# Patient Record
Sex: Female | Born: 1945
Health system: Southern US, Community
[De-identification: ages and names within clinical notes are randomized; demographics above are authoritative.]

## PROBLEM LIST (undated history)

## (undated) DIAGNOSIS — T7840XA Allergy, unspecified, initial encounter: Secondary | ICD-10-CM

## (undated) DIAGNOSIS — Z789 Other specified health status: Secondary | ICD-10-CM

## (undated) DIAGNOSIS — F419 Anxiety disorder, unspecified: Secondary | ICD-10-CM

## (undated) DIAGNOSIS — F32A Depression, unspecified: Secondary | ICD-10-CM

## (undated) DIAGNOSIS — E785 Hyperlipidemia, unspecified: Secondary | ICD-10-CM

## (undated) DIAGNOSIS — H269 Unspecified cataract: Secondary | ICD-10-CM

## (undated) HISTORY — PX: OTHER SURGICAL HISTORY: SHX169

## (undated) HISTORY — DX: Allergy, unspecified, initial encounter: T78.40XA

## (undated) HISTORY — PX: EYE SURGERY: SHX253

## (undated) HISTORY — DX: Hyperlipidemia, unspecified: E78.5

---

## 1970-04-14 HISTORY — PX: CHOLECYSTECTOMY: SHX55

## 1978-04-14 HISTORY — PX: ABDOMINAL HYSTERECTOMY: SHX81

## 2005-03-05 ENCOUNTER — Ambulatory Visit: Payer: Self-pay | Admitting: Internal Medicine

## 2007-03-25 ENCOUNTER — Ambulatory Visit: Payer: Self-pay | Admitting: Internal Medicine

## 2008-04-05 ENCOUNTER — Ambulatory Visit: Payer: Self-pay | Admitting: Internal Medicine

## 2009-04-10 ENCOUNTER — Ambulatory Visit: Payer: Self-pay | Admitting: Internal Medicine

## 2010-05-06 ENCOUNTER — Ambulatory Visit: Payer: Self-pay | Admitting: Internal Medicine

## 2011-06-03 ENCOUNTER — Ambulatory Visit: Payer: Self-pay | Admitting: Internal Medicine

## 2013-05-16 ENCOUNTER — Ambulatory Visit: Payer: Self-pay | Admitting: Family Medicine

## 2014-05-18 ENCOUNTER — Ambulatory Visit: Payer: Self-pay | Admitting: Family Medicine

## 2014-12-21 ENCOUNTER — Ambulatory Visit (INDEPENDENT_AMBULATORY_CARE_PROVIDER_SITE_OTHER): Payer: PPO | Admitting: Family Medicine

## 2014-12-21 ENCOUNTER — Encounter: Payer: Self-pay | Admitting: Family Medicine

## 2014-12-21 VITALS — BP 128/66 | HR 76 | Temp 97.8°F | Resp 18 | Ht 61.0 in | Wt 157.8 lb

## 2014-12-21 DIAGNOSIS — E785 Hyperlipidemia, unspecified: Secondary | ICD-10-CM

## 2014-12-21 NOTE — Progress Notes (Signed)
Name: Abigail Hatfield   MRN: [id number]    DOB: [birth year]   Date:12/21/2014       Progress Note  Subjective  Chief Complaint  Chief Complaint  Patient presents with  . Follow-up    6 mo / labs for cholesterol - fasting  . Hyperlipidemia    Hyperlipidemia The problem is controlled. Recent lipid tests were reviewed and are normal. Pertinent negatives include no chest pain or shortness of breath. Current antihyperlipidemic treatment includes diet change, exercise and herbal therapy (Red yeast rice). The current treatment provides significant improvement of lipids. There are no compliance problems.     Past Medical History  Diagnosis Date  . Allergy   . Hyperlipidemia     Past Surgical History  Procedure Laterality Date  . Abdominal hysterectomy  1980  . Cholecystectomy  1972  . Cesarean section  1967  . Cesarean section  1971    Family History  Problem Relation Age of Onset  . Diabetes Mother   . Hypertension Mother   . Heart failure Father   . Diabetes Brother   . Hypertension Brother     Social History   Social History  . Marital Status: Married    Spouse Name: N/A  . Number of Children: N/A  . Years of Education: N/A   Occupational History  . Not on file.   Social History Main Topics  . Smoking status: Never Smoker   . Smokeless tobacco: Never Used  . Alcohol Use: No  . Drug Use: No  . Sexual Activity: No   Other Topics Concern  . Not on file   Social History Narrative  . No narrative on file     Current outpatient prescriptions:  .  Black Cohosh 540 MG CAPS, , Disp: , Rfl:  .  Cholecalciferol (VITAMIN D3) 5000 UNITS CAPS, Take by mouth., Disp: , Rfl:  .  Misc Nat HMG CoA Reduct Inhib (CHOLESTIN PO), 1,200 mg., Disp: , Rfl:  .  nitrofurantoin, macrocrystal-monohydrate, (MACROBID) 100 MG capsule, Take by mouth., Disp: , Rfl:   Allergies  Allergen Reactions  . Moxifloxacin Hcl In Nacl Swelling  . Prednisone   . Penicillins Rash   Review of  Systems  Constitutional: Positive for weight loss. Negative for fever and chills.  Respiratory: Negative for shortness of breath.   Cardiovascular: Negative for chest pain.  Gastrointestinal: Negative for abdominal pain.   Objective  Filed Vitals:   12/21/14 0902  BP: 128/66  Pulse: 76  Temp: 97.8 F (36.6 C)  TempSrc: Oral  Resp: 18  Height: 5\' 1"  (1.549 m)  Weight: 157 lb 12.8 oz (71.578 kg)  SpO2: 94%    Physical Exam  Constitutional: She is well-developed, well-nourished, and in no distress.  Cardiovascular: Normal rate and regular rhythm.   Pulmonary/Chest: Effort normal and breath sounds normal.  Abdominal: Soft. Bowel sounds are normal.  Nursing note and vitals reviewed.   Assessment & Plan  1. Dyslipidemia  Recheck FLP and liver enzymes. Continue excellent dietary and lifestyle interventions.  - Lipid Profile - Comprehensive Metabolic Panel (CMET)   Abigail Hatfield Asad A. [facility] 12/21/2014 9:17 AM

## 2014-12-22 ENCOUNTER — Telehealth: Payer: Self-pay | Admitting: Family Medicine

## 2014-12-22 LAB — LIPID PANEL
CHOL/HDL RATIO: 2.7 ratio (ref 0.0–4.4)
CHOLESTEROL TOTAL: 173 mg/dL (ref 100–199)
HDL: 63 mg/dL (ref >39)
LDL Calculated: 90 mg/dL (ref 0–99)
TRIGLYCERIDES: 100 mg/dL (ref 0–149)
VLDL Cholesterol Cal: 20 mg/dL (ref 5–40)

## 2014-12-22 LAB — COMPREHENSIVE METABOLIC PANEL
A/G RATIO: 1.6 (ref 1.1–2.5)
ALBUMIN: 4.3 g/dL (ref 3.5–4.8)
ALT: 13 IU/L (ref 0–32)
AST: 18 IU/L (ref 0–40)
Alkaline Phosphatase: 51 IU/L (ref 39–117)
BILIRUBIN TOTAL: 0.4 mg/dL (ref 0.0–1.2)
BUN / CREAT RATIO: 16 (ref 11–26)
BUN: 14 mg/dL (ref 8–27)
CALCIUM: 9.8 mg/dL (ref 8.7–10.3)
CO2: 25 mmol/L (ref 18–29)
Chloride: 100 mmol/L (ref 97–108)
Creatinine, Ser: 0.88 mg/dL (ref 0.57–1.00)
GFR, EST AFRICAN AMERICAN: 77 mL/min/{1.73_m2} (ref >59)
GFR, EST NON AFRICAN AMERICAN: 67 mL/min/{1.73_m2} (ref >59)
Globulin, Total: 2.7 g/dL (ref 1.5–4.5)
Glucose: 99 mg/dL (ref 65–99)
POTASSIUM: 5 mmol/L (ref 3.5–5.2)
Sodium: 141 mmol/L (ref 134–144)
TOTAL PROTEIN: 7 g/dL (ref 6.0–8.5)

## 2014-12-22 NOTE — Telephone Encounter (Signed)
SHE JUST NEEDS TO KNOW IF YOU FAXED HER A REF TO THE [facility] ESMEIL BUT TO GO TO [place]  IN [place]. THE DOS OF SERVICE WAS 12-14-2013. FAX # IS [phone].

## 2014-12-27 NOTE — Telephone Encounter (Signed)
Notified patient that [employer] do not allow [place] to back-date referrals any longer. Her Eye doctor should be aware of this as most doctor offices are aware of this policy put in place.

## 2015-05-02 DIAGNOSIS — M9905 Segmental and somatic dysfunction of pelvic region: Secondary | ICD-10-CM | POA: Diagnosis not present

## 2015-05-02 DIAGNOSIS — M5416 Radiculopathy, lumbar region: Secondary | ICD-10-CM | POA: Diagnosis not present

## 2015-05-02 DIAGNOSIS — M955 Acquired deformity of pelvis: Secondary | ICD-10-CM | POA: Diagnosis not present

## 2015-05-02 DIAGNOSIS — M9903 Segmental and somatic dysfunction of lumbar region: Secondary | ICD-10-CM | POA: Diagnosis not present

## 2015-05-29 DIAGNOSIS — M9905 Segmental and somatic dysfunction of pelvic region: Secondary | ICD-10-CM | POA: Diagnosis not present

## 2015-05-29 DIAGNOSIS — M955 Acquired deformity of pelvis: Secondary | ICD-10-CM | POA: Diagnosis not present

## 2015-05-29 DIAGNOSIS — M5416 Radiculopathy, lumbar region: Secondary | ICD-10-CM | POA: Diagnosis not present

## 2015-05-29 DIAGNOSIS — M9903 Segmental and somatic dysfunction of lumbar region: Secondary | ICD-10-CM | POA: Diagnosis not present

## 2015-06-27 DIAGNOSIS — M955 Acquired deformity of pelvis: Secondary | ICD-10-CM | POA: Diagnosis not present

## 2015-06-27 DIAGNOSIS — M5416 Radiculopathy, lumbar region: Secondary | ICD-10-CM | POA: Diagnosis not present

## 2015-06-27 DIAGNOSIS — M9903 Segmental and somatic dysfunction of lumbar region: Secondary | ICD-10-CM | POA: Diagnosis not present

## 2015-06-27 DIAGNOSIS — M9905 Segmental and somatic dysfunction of pelvic region: Secondary | ICD-10-CM | POA: Diagnosis not present

## 2015-07-25 DIAGNOSIS — M9903 Segmental and somatic dysfunction of lumbar region: Secondary | ICD-10-CM | POA: Diagnosis not present

## 2015-07-25 DIAGNOSIS — M5416 Radiculopathy, lumbar region: Secondary | ICD-10-CM | POA: Diagnosis not present

## 2015-07-25 DIAGNOSIS — M955 Acquired deformity of pelvis: Secondary | ICD-10-CM | POA: Diagnosis not present

## 2015-07-25 DIAGNOSIS — M9905 Segmental and somatic dysfunction of pelvic region: Secondary | ICD-10-CM | POA: Diagnosis not present

## 2015-08-22 DIAGNOSIS — M9903 Segmental and somatic dysfunction of lumbar region: Secondary | ICD-10-CM | POA: Diagnosis not present

## 2015-08-22 DIAGNOSIS — M9905 Segmental and somatic dysfunction of pelvic region: Secondary | ICD-10-CM | POA: Diagnosis not present

## 2015-08-22 DIAGNOSIS — M5416 Radiculopathy, lumbar region: Secondary | ICD-10-CM | POA: Diagnosis not present

## 2015-08-22 DIAGNOSIS — M955 Acquired deformity of pelvis: Secondary | ICD-10-CM | POA: Diagnosis not present

## 2015-09-19 DIAGNOSIS — M5416 Radiculopathy, lumbar region: Secondary | ICD-10-CM | POA: Diagnosis not present

## 2015-09-19 DIAGNOSIS — M9905 Segmental and somatic dysfunction of pelvic region: Secondary | ICD-10-CM | POA: Diagnosis not present

## 2015-09-19 DIAGNOSIS — M955 Acquired deformity of pelvis: Secondary | ICD-10-CM | POA: Diagnosis not present

## 2015-09-19 DIAGNOSIS — M9903 Segmental and somatic dysfunction of lumbar region: Secondary | ICD-10-CM | POA: Diagnosis not present

## 2015-10-17 DIAGNOSIS — M9903 Segmental and somatic dysfunction of lumbar region: Secondary | ICD-10-CM | POA: Diagnosis not present

## 2015-10-17 DIAGNOSIS — M9905 Segmental and somatic dysfunction of pelvic region: Secondary | ICD-10-CM | POA: Diagnosis not present

## 2015-10-17 DIAGNOSIS — M955 Acquired deformity of pelvis: Secondary | ICD-10-CM | POA: Diagnosis not present

## 2015-10-17 DIAGNOSIS — M5416 Radiculopathy, lumbar region: Secondary | ICD-10-CM | POA: Diagnosis not present

## 2015-11-14 DIAGNOSIS — M5416 Radiculopathy, lumbar region: Secondary | ICD-10-CM | POA: Diagnosis not present

## 2015-11-14 DIAGNOSIS — M955 Acquired deformity of pelvis: Secondary | ICD-10-CM | POA: Diagnosis not present

## 2015-11-14 DIAGNOSIS — M9903 Segmental and somatic dysfunction of lumbar region: Secondary | ICD-10-CM | POA: Diagnosis not present

## 2015-11-14 DIAGNOSIS — M9905 Segmental and somatic dysfunction of pelvic region: Secondary | ICD-10-CM | POA: Diagnosis not present

## 2015-12-12 ENCOUNTER — Telehealth: Payer: Self-pay | Admitting: Family Medicine

## 2015-12-12 DIAGNOSIS — M9905 Segmental and somatic dysfunction of pelvic region: Secondary | ICD-10-CM | POA: Diagnosis not present

## 2015-12-12 DIAGNOSIS — M9903 Segmental and somatic dysfunction of lumbar region: Secondary | ICD-10-CM | POA: Diagnosis not present

## 2015-12-12 DIAGNOSIS — M955 Acquired deformity of pelvis: Secondary | ICD-10-CM | POA: Diagnosis not present

## 2015-12-12 DIAGNOSIS — M5416 Radiculopathy, lumbar region: Secondary | ICD-10-CM | POA: Diagnosis not present

## 2015-12-12 NOTE — Telephone Encounter (Signed)
We will order lab work after her office visit. Please inform patient to come in fasting and drink a lot of water.

## 2015-12-13 ENCOUNTER — Encounter: Payer: Self-pay | Admitting: Family Medicine

## 2015-12-13 ENCOUNTER — Telehealth: Payer: Self-pay | Admitting: Family Medicine

## 2015-12-13 ENCOUNTER — Ambulatory Visit (INDEPENDENT_AMBULATORY_CARE_PROVIDER_SITE_OTHER): Payer: PPO | Admitting: Family Medicine

## 2015-12-13 VITALS — BP 130/67 | HR 79 | Temp 98.6°F | Resp 15 | Ht 61.0 in | Wt 151.5 lb

## 2015-12-13 DIAGNOSIS — E559 Vitamin D deficiency, unspecified: Secondary | ICD-10-CM | POA: Diagnosis not present

## 2015-12-13 DIAGNOSIS — Z01419 Encounter for gynecological examination (general) (routine) without abnormal findings: Secondary | ICD-10-CM

## 2015-12-13 DIAGNOSIS — Z1211 Encounter for screening for malignant neoplasm of colon: Secondary | ICD-10-CM

## 2015-12-13 DIAGNOSIS — E162 Hypoglycemia, unspecified: Secondary | ICD-10-CM | POA: Diagnosis not present

## 2015-12-13 DIAGNOSIS — R5383 Other fatigue: Secondary | ICD-10-CM | POA: Diagnosis not present

## 2015-12-13 DIAGNOSIS — Z1239 Encounter for other screening for malignant neoplasm of breast: Secondary | ICD-10-CM

## 2015-12-13 DIAGNOSIS — E785 Hyperlipidemia, unspecified: Secondary | ICD-10-CM | POA: Diagnosis not present

## 2015-12-13 LAB — COMPLETE METABOLIC PANEL WITH GFR
ALT: 12 U/L (ref 6–29)
AST: 17 U/L (ref 10–35)
Albumin: 4.1 g/dL (ref 3.6–5.1)
Alkaline Phosphatase: 41 U/L (ref 33–130)
BUN: 14 mg/dL (ref 7–25)
CHLORIDE: 106 mmol/L (ref 98–110)
CO2: 28 mmol/L (ref 20–31)
Calcium: 9.7 mg/dL (ref 8.6–10.4)
Creat: 0.83 mg/dL (ref 0.60–0.93)
GFR, EST NON AFRICAN AMERICAN: 71 mL/min (ref >=60)
GFR, Est African American: 82 mL/min (ref >=60)
GLUCOSE: 91 mg/dL (ref 65–99)
POTASSIUM: 5 mmol/L (ref 3.5–5.3)
SODIUM: 144 mmol/L (ref 135–146)
Total Bilirubin: 0.5 mg/dL (ref 0.2–1.2)
Total Protein: 6.9 g/dL (ref 6.1–8.1)

## 2015-12-13 LAB — LIPID PANEL
CHOL/HDL RATIO: 2.2 ratio (ref <=5.0)
Cholesterol: 176 mg/dL (ref 125–200)
HDL: 80 mg/dL (ref >=46)
LDL CALC: 79 mg/dL (ref <130)
Triglycerides: 84 mg/dL (ref <150)
VLDL: 17 mg/dL (ref <30)

## 2015-12-13 LAB — TSH: TSH: 2.02 mIU/L

## 2015-12-13 NOTE — Telephone Encounter (Signed)
Medication list has been updated.

## 2015-12-13 NOTE — Progress Notes (Signed)
Name: Abigail Hatfield   MRN: [id number]    DOB: Apr 20, 1945   Date:12/13/2015       Progress Note  Subjective  Chief Complaint  Chief Complaint  Patient presents with  . Annual Exam    CPE    HPI  Pt. Presents for a Complete Physical Exam Mammogram is done yearly, last mammogram February 2016. Never had a Colonoscopy. Never had a DEXA Scan.  Past Medical History:  Diagnosis Date  . Allergy   . Hyperlipidemia     Past Surgical History:  Procedure Laterality Date  . ABDOMINAL HYSTERECTOMY  1980  . [place]  . CESAREAN SECTION  1971  . CHOLECYSTECTOMY  1972    Family History  Problem Relation Age of Onset  . Diabetes Mother   . Hypertension Mother   . Heart failure Father   . Diabetes Brother   . Hypertension Brother     Social History   Social History  . Marital status: Married    Spouse name: N/A  . Number of children: N/A  . Years of education: N/A   Occupational History  . Not on file.   Social History Main Topics  . Smoking status: Never Smoker  . Smokeless tobacco: Never Used  . Alcohol use No  . Drug use: No  . Sexual activity: No   Other Topics Concern  . Not on file   Social History Narrative  . No narrative on file     Current Outpatient Prescriptions:  .  Black Cohosh 540 MG CAPS, , Disp: , Rfl:  .  Cholecalciferol (VITAMIN D3) 5000 UNITS CAPS, Take by mouth., Disp: , Rfl:  .  Misc Nat HMG CoA Reduct Inhib (CHOLESTIN PO), 1,200 mg., Disp: , Rfl:  .  nitrofurantoin, macrocrystal-monohydrate, (MACROBID) 100 MG capsule, Take by mouth., Disp: , Rfl:   Allergies  Allergen Reactions  . Moxifloxacin Hcl In Nacl Swelling  . Prednisone   . Penicillins Rash     Review of Systems  Constitutional: Negative for chills, fever and malaise/fatigue.  HENT: Negative for congestion and sore throat.   Eyes: Negative for blurred vision.  Respiratory: Negative for cough, sputum production and shortness of breath.   Cardiovascular:  Negative for chest pain, palpitations and leg swelling.  Gastrointestinal: Negative for blood in stool, constipation, heartburn, nausea and vomiting.  Genitourinary: Negative for dysuria and frequency.  Musculoskeletal: Positive for back pain. Negative for myalgias.  Skin: Negative for rash.  Neurological: Negative for dizziness and headaches.  Psychiatric/Behavioral: Negative for depression. The patient is not nervous/anxious.      Objective  Vitals:   12/13/15 0857  BP: 130/67  Pulse: 79  Resp: 15  Temp: 98.6 F (37 C)  TempSrc: Oral  SpO2: 96%  Weight: 151 lb 8 oz (68.7 kg)  Height: 5\' 1"  (1.549 m)    Physical Exam  Constitutional: She is oriented to person, place, and time and well-developed, well-nourished, and in no distress.  HENT:  Head: Normocephalic and atraumatic.  Right Ear: Tympanic membrane and ear canal normal. No drainage or swelling.  Left Ear: Tympanic membrane and ear canal normal. No drainage or swelling.  Mouth/Throat: No posterior oropharyngeal erythema.  Cardiovascular: Normal rate, regular rhythm, S1 normal, S2 normal and normal heart sounds.   No murmur heard. Pulmonary/Chest: Breath sounds normal. She has no wheezes. Right breast exhibits no mass and no nipple discharge. Left breast exhibits no mass and no nipple discharge.  Abdominal: Soft. Bowel sounds  are normal. There is no tenderness.  Genitourinary:  Genitourinary Comments: Deferred.  Musculoskeletal:       Right ankle: She exhibits no swelling.       Left ankle: She exhibits no swelling.  Neurological: She is alert and oriented to person, place, and time.  Skin: Skin is warm and dry.  Psychiatric: Mood, memory, affect and judgment normal.  Nursing note and vitals reviewed.    Assessment & Plan  1. Well woman exam with routine gynecological exam Age-appropriate lab screening obtained.  - Lipid Profile - COMPLETE METABOLIC PANEL WITH GFR - Vitamin D (25 hydroxy) - TSH - HgB  A1c  2. Screening for breast cancer  - MM Digital Screening; Future  3. Screening for colon cancer Never had a colonoscopy, order Cologuard.  - Cologuard   Jesilyn Easom Asad A. [facility] 12/13/2015 9:03 AM

## 2015-12-14 LAB — HEMOGLOBIN A1C
HEMOGLOBIN A1C: 5.3 % (ref <5.7)
MEAN PLASMA GLUCOSE: 105 mg/dL

## 2015-12-14 LAB — VITAMIN D 25 HYDROXY (VIT D DEFICIENCY, FRACTURES): Vit D, 25-Hydroxy: 72 ng/mL (ref 30–100)

## 2015-12-18 DIAGNOSIS — M9905 Segmental and somatic dysfunction of pelvic region: Secondary | ICD-10-CM | POA: Diagnosis not present

## 2015-12-18 DIAGNOSIS — M5416 Radiculopathy, lumbar region: Secondary | ICD-10-CM | POA: Diagnosis not present

## 2015-12-18 DIAGNOSIS — M955 Acquired deformity of pelvis: Secondary | ICD-10-CM | POA: Diagnosis not present

## 2015-12-18 DIAGNOSIS — M9903 Segmental and somatic dysfunction of lumbar region: Secondary | ICD-10-CM | POA: Diagnosis not present

## 2015-12-20 ENCOUNTER — Encounter: Payer: PPO | Admitting: Family Medicine

## 2015-12-25 ENCOUNTER — Telehealth: Payer: Self-pay | Admitting: Family Medicine

## 2015-12-27 NOTE — Telephone Encounter (Signed)
Patient has been notified of lab results  

## 2016-01-01 ENCOUNTER — Ambulatory Visit: Payer: Self-pay

## 2016-01-07 ENCOUNTER — Ambulatory Visit
Admission: RE | Admit: 2016-01-07 | Discharge: 2016-01-07 | Disposition: A | Discharge status: Home / Self Care | Payer: PPO | Source: Ambulatory Visit | Attending: Family Medicine | Admitting: Family Medicine

## 2016-01-07 ENCOUNTER — Other Ambulatory Visit: Payer: Self-pay | Admitting: Family Medicine

## 2016-01-07 DIAGNOSIS — Z1239 Encounter for other screening for malignant neoplasm of breast: Secondary | ICD-10-CM

## 2016-01-07 DIAGNOSIS — Z1231 Encounter for screening mammogram for malignant neoplasm of breast: Secondary | ICD-10-CM | POA: Diagnosis not present

## 2016-01-09 DIAGNOSIS — M9905 Segmental and somatic dysfunction of pelvic region: Secondary | ICD-10-CM | POA: Diagnosis not present

## 2016-01-09 DIAGNOSIS — M9903 Segmental and somatic dysfunction of lumbar region: Secondary | ICD-10-CM | POA: Diagnosis not present

## 2016-01-09 DIAGNOSIS — M5416 Radiculopathy, lumbar region: Secondary | ICD-10-CM | POA: Diagnosis not present

## 2016-01-09 DIAGNOSIS — M955 Acquired deformity of pelvis: Secondary | ICD-10-CM | POA: Diagnosis not present

## 2016-01-14 DIAGNOSIS — M9903 Segmental and somatic dysfunction of lumbar region: Secondary | ICD-10-CM | POA: Diagnosis not present

## 2016-01-14 DIAGNOSIS — M5416 Radiculopathy, lumbar region: Secondary | ICD-10-CM | POA: Diagnosis not present

## 2016-01-14 DIAGNOSIS — M955 Acquired deformity of pelvis: Secondary | ICD-10-CM | POA: Diagnosis not present

## 2016-01-14 DIAGNOSIS — M9905 Segmental and somatic dysfunction of pelvic region: Secondary | ICD-10-CM | POA: Diagnosis not present

## 2016-02-06 DIAGNOSIS — M9905 Segmental and somatic dysfunction of pelvic region: Secondary | ICD-10-CM | POA: Diagnosis not present

## 2016-02-06 DIAGNOSIS — M5416 Radiculopathy, lumbar region: Secondary | ICD-10-CM | POA: Diagnosis not present

## 2016-02-06 DIAGNOSIS — M9903 Segmental and somatic dysfunction of lumbar region: Secondary | ICD-10-CM | POA: Diagnosis not present

## 2016-02-06 DIAGNOSIS — M955 Acquired deformity of pelvis: Secondary | ICD-10-CM | POA: Diagnosis not present

## 2016-03-12 DIAGNOSIS — M9903 Segmental and somatic dysfunction of lumbar region: Secondary | ICD-10-CM | POA: Diagnosis not present

## 2016-03-12 DIAGNOSIS — M9905 Segmental and somatic dysfunction of pelvic region: Secondary | ICD-10-CM | POA: Diagnosis not present

## 2016-03-12 DIAGNOSIS — M955 Acquired deformity of pelvis: Secondary | ICD-10-CM | POA: Diagnosis not present

## 2016-03-12 DIAGNOSIS — M5416 Radiculopathy, lumbar region: Secondary | ICD-10-CM | POA: Diagnosis not present

## 2016-03-14 DIAGNOSIS — H2511 Age-related nuclear cataract, right eye: Secondary | ICD-10-CM | POA: Diagnosis not present

## 2016-04-09 DIAGNOSIS — M5416 Radiculopathy, lumbar region: Secondary | ICD-10-CM | POA: Diagnosis not present

## 2016-04-09 DIAGNOSIS — M9903 Segmental and somatic dysfunction of lumbar region: Secondary | ICD-10-CM | POA: Diagnosis not present

## 2016-04-09 DIAGNOSIS — M955 Acquired deformity of pelvis: Secondary | ICD-10-CM | POA: Diagnosis not present

## 2016-04-09 DIAGNOSIS — M9905 Segmental and somatic dysfunction of pelvic region: Secondary | ICD-10-CM | POA: Diagnosis not present

## 2016-05-07 DIAGNOSIS — M955 Acquired deformity of pelvis: Secondary | ICD-10-CM | POA: Diagnosis not present

## 2016-05-07 DIAGNOSIS — M5416 Radiculopathy, lumbar region: Secondary | ICD-10-CM | POA: Diagnosis not present

## 2016-05-07 DIAGNOSIS — M9903 Segmental and somatic dysfunction of lumbar region: Secondary | ICD-10-CM | POA: Diagnosis not present

## 2016-05-07 DIAGNOSIS — M9905 Segmental and somatic dysfunction of pelvic region: Secondary | ICD-10-CM | POA: Diagnosis not present

## 2016-06-04 DIAGNOSIS — M5416 Radiculopathy, lumbar region: Secondary | ICD-10-CM | POA: Diagnosis not present

## 2016-06-04 DIAGNOSIS — M9905 Segmental and somatic dysfunction of pelvic region: Secondary | ICD-10-CM | POA: Diagnosis not present

## 2016-06-04 DIAGNOSIS — M9903 Segmental and somatic dysfunction of lumbar region: Secondary | ICD-10-CM | POA: Diagnosis not present

## 2016-06-04 DIAGNOSIS — M955 Acquired deformity of pelvis: Secondary | ICD-10-CM | POA: Diagnosis not present

## 2016-07-02 DIAGNOSIS — M955 Acquired deformity of pelvis: Secondary | ICD-10-CM | POA: Diagnosis not present

## 2016-07-02 DIAGNOSIS — M9903 Segmental and somatic dysfunction of lumbar region: Secondary | ICD-10-CM | POA: Diagnosis not present

## 2016-07-02 DIAGNOSIS — M9905 Segmental and somatic dysfunction of pelvic region: Secondary | ICD-10-CM | POA: Diagnosis not present

## 2016-07-02 DIAGNOSIS — M5416 Radiculopathy, lumbar region: Secondary | ICD-10-CM | POA: Diagnosis not present

## 2016-07-30 DIAGNOSIS — M9905 Segmental and somatic dysfunction of pelvic region: Secondary | ICD-10-CM | POA: Diagnosis not present

## 2016-07-30 DIAGNOSIS — M955 Acquired deformity of pelvis: Secondary | ICD-10-CM | POA: Diagnosis not present

## 2016-07-30 DIAGNOSIS — M9903 Segmental and somatic dysfunction of lumbar region: Secondary | ICD-10-CM | POA: Diagnosis not present

## 2016-07-30 DIAGNOSIS — M5416 Radiculopathy, lumbar region: Secondary | ICD-10-CM | POA: Diagnosis not present

## 2016-08-27 DIAGNOSIS — M9905 Segmental and somatic dysfunction of pelvic region: Secondary | ICD-10-CM | POA: Diagnosis not present

## 2016-08-27 DIAGNOSIS — M955 Acquired deformity of pelvis: Secondary | ICD-10-CM | POA: Diagnosis not present

## 2016-08-27 DIAGNOSIS — M9903 Segmental and somatic dysfunction of lumbar region: Secondary | ICD-10-CM | POA: Diagnosis not present

## 2016-08-27 DIAGNOSIS — M5416 Radiculopathy, lumbar region: Secondary | ICD-10-CM | POA: Diagnosis not present

## 2016-10-22 DIAGNOSIS — M9905 Segmental and somatic dysfunction of pelvic region: Secondary | ICD-10-CM | POA: Diagnosis not present

## 2016-10-22 DIAGNOSIS — M9903 Segmental and somatic dysfunction of lumbar region: Secondary | ICD-10-CM | POA: Diagnosis not present

## 2016-10-22 DIAGNOSIS — M5416 Radiculopathy, lumbar region: Secondary | ICD-10-CM | POA: Diagnosis not present

## 2016-10-22 DIAGNOSIS — M955 Acquired deformity of pelvis: Secondary | ICD-10-CM | POA: Diagnosis not present

## 2016-11-25 ENCOUNTER — Telehealth: Payer: Self-pay | Admitting: Family Medicine

## 2016-11-25 ENCOUNTER — Other Ambulatory Visit: Payer: Self-pay | Admitting: Family Medicine

## 2016-11-25 DIAGNOSIS — Z01419 Encounter for gynecological examination (general) (routine) without abnormal findings: Secondary | ICD-10-CM

## 2016-11-25 DIAGNOSIS — Z1159 Encounter for screening for other viral diseases: Secondary | ICD-10-CM

## 2016-11-25 NOTE — Telephone Encounter (Signed)
Pt is stressing over having to get blood work and she has scheduled her CPE for Aug 22nd andshe is wanting to get her labs done in the next few days. Could you please get her lab ordered so that she can come in either Wed or Thur of this week please for her labs.

## 2016-11-25 NOTE — Telephone Encounter (Signed)
Pt said that she is needing a mammogram and [place] told her that the Dr will need to send an order to get this. Pt ask if the Dr will send the order so that she can get this.

## 2016-11-26 DIAGNOSIS — Z01419 Encounter for gynecological examination (general) (routine) without abnormal findings: Secondary | ICD-10-CM | POA: Diagnosis not present

## 2016-11-26 DIAGNOSIS — Z1159 Encounter for screening for other viral diseases: Secondary | ICD-10-CM | POA: Diagnosis not present

## 2016-11-26 DIAGNOSIS — E559 Vitamin D deficiency, unspecified: Secondary | ICD-10-CM | POA: Diagnosis not present

## 2016-11-26 DIAGNOSIS — E785 Hyperlipidemia, unspecified: Secondary | ICD-10-CM | POA: Diagnosis not present

## 2016-11-26 NOTE — Telephone Encounter (Signed)
Pertinent age appropriate lab work has been ordered for the patient

## 2016-11-27 LAB — CBC WITH DIFFERENTIAL/PLATELET
BASOS PCT: 1 %
Basophils Absolute: 55 cells/uL (ref 0–200)
Eosinophils Absolute: 165 cells/uL (ref 15–500)
Eosinophils Relative: 3 %
HCT: 40.7 % (ref 35.0–45.0)
Hemoglobin: 13.3 g/dL (ref 11.7–15.5)
Lymphocytes Relative: 33 %
Lymphs Abs: 1815 cells/uL (ref 850–3900)
MCH: 31.9 pg (ref 27.0–33.0)
MCHC: 32.7 g/dL (ref 32.0–36.0)
MCV: 97.6 fL (ref 80.0–100.0)
MONOS PCT: 5 %
MPV: 9.7 fL (ref 7.5–12.5)
Monocytes Absolute: 275 cells/uL (ref 200–950)
NEUTROS ABS: 3190 {cells}/uL (ref 1500–7800)
Neutrophils Relative %: 58 %
PLATELETS: 320 10*3/uL (ref 140–400)
RBC: 4.17 MIL/uL (ref 3.80–5.10)
RDW: 14 % (ref 11.0–15.0)
WBC: 5.5 10*3/uL (ref 3.8–10.8)

## 2016-11-27 LAB — TSH: TSH: 2.35 mIU/L

## 2016-11-27 NOTE — Telephone Encounter (Signed)
Screening imaging studies will be discussed at the time of her office visit

## 2016-11-28 LAB — LIPID PANEL
Cholesterol: 205 mg/dL — ABNORMAL HIGH (ref <200)
HDL: 76 mg/dL (ref >50)
LDL Cholesterol: 109 mg/dL — ABNORMAL HIGH (ref <100)
Total CHOL/HDL Ratio: 2.7 ratio (ref <5.0)
Triglycerides: 101 mg/dL (ref <150)
VLDL: 20 mg/dL (ref <30)

## 2016-11-28 LAB — COMPLETE METABOLIC PANEL WITHOUT GFR
ALT: 10 U/L (ref 6–29)
AST: 15 U/L (ref 10–35)
Albumin: 4.2 g/dL (ref 3.6–5.1)
Alkaline Phosphatase: 45 U/L (ref 33–130)
BUN: 14 mg/dL (ref 7–25)
CO2: 24 mmol/L (ref 20–32)
Calcium: 9.5 mg/dL (ref 8.6–10.4)
Chloride: 105 mmol/L (ref 98–110)
Creat: 0.81 mg/dL (ref 0.60–0.93)
GFR, Est African American: 85 mL/min (ref >=60)
GFR, Est Non African American: 73 mL/min (ref >=60)
Glucose, Bld: 81 mg/dL (ref 65–99)
Potassium: 5 mmol/L (ref 3.5–5.3)
Sodium: 144 mmol/L (ref 135–146)
Total Bilirubin: 0.5 mg/dL (ref 0.2–1.2)
Total Protein: 6.5 g/dL (ref 6.1–8.1)

## 2016-11-28 LAB — HEPATITIS C ANTIBODY: HCV Ab: NONREACTIVE

## 2016-11-28 LAB — VITAMIN D 25 HYDROXY (VIT D DEFICIENCY, FRACTURES): Vit D, 25-Hydroxy: 59 ng/mL (ref 30–100)

## 2016-12-03 ENCOUNTER — Ambulatory Visit (INDEPENDENT_AMBULATORY_CARE_PROVIDER_SITE_OTHER): Payer: PPO | Admitting: Family Medicine

## 2016-12-03 ENCOUNTER — Encounter: Payer: Self-pay | Admitting: Family Medicine

## 2016-12-03 VITALS — BP 128/80 | HR 83 | Temp 98.1°F | Resp 14 | Ht 61.0 in | Wt 159.9 lb

## 2016-12-03 DIAGNOSIS — Z1231 Encounter for screening mammogram for malignant neoplasm of breast: Secondary | ICD-10-CM

## 2016-12-03 DIAGNOSIS — Z01419 Encounter for gynecological examination (general) (routine) without abnormal findings: Secondary | ICD-10-CM

## 2016-12-03 DIAGNOSIS — Z1239 Encounter for other screening for malignant neoplasm of breast: Secondary | ICD-10-CM

## 2016-12-03 NOTE — Progress Notes (Signed)
Name: Abigail Hatfield   MRN: [id number]    DOB: [birth year]   Date:12/03/2016       Progress Note  Subjective  Chief Complaint  Chief Complaint  Patient presents with  . Annual Exam    HPI  Pt. Presents for Complete Physical Exam.  She did not complete cologuard screening last year. She had a normal mammogram last year, is due for repeat this year.  She has completed lab work pertaining to physical exam visit.    Past Medical History:  Diagnosis Date  . Allergy   . Hyperlipidemia     Past Surgical History:  Procedure Laterality Date  . ABDOMINAL HYSTERECTOMY  1980  . [place]  . CESAREAN SECTION  1971  . CHOLECYSTECTOMY  1972    Family History  Problem Relation Age of Onset  . Diabetes Mother   . Hypertension Mother   . Heart failure Father   . Diabetes Brother   . Hypertension Brother     Social History   Social History  . Marital status: Married    Spouse name: N/A  . Number of children: N/A  . Years of education: N/A   Occupational History  . Not on file.   Social History Main Topics  . Smoking status: Never Smoker  . Smokeless tobacco: Never Used  . Alcohol use No  . Drug use: No  . Sexual activity: No   Other Topics Concern  . Not on file   Social History Narrative  . No narrative on file     Current Outpatient Prescriptions:  .  Black Cohosh 540 MG CAPS, , Disp: , Rfl:  .  Cholecalciferol (VITAMIN D3) 5000 UNITS CAPS, Take by mouth., Disp: , Rfl:  .  Misc Nat HMG CoA Reduct Inhib (CHOLESTIN PO), 1,200 mg., Disp: , Rfl:   Allergies  Allergen Reactions  . Moxifloxacin Hcl In Nacl Swelling  . Prednisone   . Penicillins Rash     Review of Systems  Constitutional: Negative for chills, fever and malaise/fatigue.  HENT: Negative for congestion, ear pain and sore throat.   Eyes: Negative for blurred vision and double vision.  Respiratory: Negative for cough and shortness of breath.   Cardiovascular: Negative for chest  pain, palpitations and leg swelling.  Gastrointestinal: Negative for blood in stool, constipation, diarrhea, nausea and vomiting.  Genitourinary: Negative for dysuria and hematuria.  Musculoskeletal: Negative for back pain and neck pain.  Neurological: Negative for dizziness and headaches.  Psychiatric/Behavioral: Negative for depression. The patient is not nervous/anxious and does not have insomnia.     Objective  Vitals:   12/03/16 1456  BP: 128/80  Pulse: 83  Resp: 14  Temp: 98.1 F (36.7 C)  TempSrc: Oral  SpO2: 96%  Weight: 159 lb 14.4 oz (72.5 kg)  Height: 5\' 1"  (1.549 m)    Physical Exam  Constitutional: She is oriented to person, place, and time and well-developed, well-nourished, and in no distress.  HENT:  Head: Normocephalic and atraumatic.  Right Ear: External ear normal.  Left Ear: External ear normal.  Mouth/Throat: Oropharynx is clear and moist.  Eyes: Pupils are equal, round, and reactive to light.  Neck: Neck supple. No thyromegaly present.  Cardiovascular: Normal rate, regular rhythm and normal heart sounds.   No murmur heard. Pulmonary/Chest: Effort normal and breath sounds normal. She has no wheezes.  Abdominal: Soft. Bowel sounds are normal. There is no tenderness.  Musculoskeletal: She exhibits no edema.  Neurological: She  is alert and oriented to person, place, and time.  Psychiatric: Mood, memory, affect and judgment normal.  Nursing note and vitals reviewed.     Assessment & Plan  1. Well woman exam Obtained age-appropriate laboratory screenings  2. Screening for breast cancer  - MM Digital Screening; Future   Marijean Montanye Asad A. [facility] 12/03/2016 3:00 PM

## 2016-12-08 ENCOUNTER — Encounter: Payer: PPO | Admitting: Family Medicine

## 2016-12-17 DIAGNOSIS — M5033 Other cervical disc degeneration, cervicothoracic region: Secondary | ICD-10-CM | POA: Diagnosis not present

## 2016-12-17 DIAGNOSIS — M9901 Segmental and somatic dysfunction of cervical region: Secondary | ICD-10-CM | POA: Diagnosis not present

## 2016-12-17 DIAGNOSIS — M62830 Muscle spasm of back: Secondary | ICD-10-CM | POA: Diagnosis not present

## 2016-12-17 DIAGNOSIS — M9903 Segmental and somatic dysfunction of lumbar region: Secondary | ICD-10-CM | POA: Diagnosis not present

## 2017-01-07 ENCOUNTER — Ambulatory Visit
Admission: RE | Admit: 2017-01-07 | Discharge: 2017-01-07 | Disposition: A | Discharge status: Home / Self Care | Payer: PPO | Source: Ambulatory Visit | Attending: Family Medicine | Admitting: Family Medicine

## 2017-01-07 DIAGNOSIS — Z1231 Encounter for screening mammogram for malignant neoplasm of breast: Secondary | ICD-10-CM | POA: Insufficient documentation

## 2017-01-07 DIAGNOSIS — Z1239 Encounter for other screening for malignant neoplasm of breast: Secondary | ICD-10-CM

## 2017-02-17 DIAGNOSIS — M9903 Segmental and somatic dysfunction of lumbar region: Secondary | ICD-10-CM | POA: Diagnosis not present

## 2017-02-17 DIAGNOSIS — M5033 Other cervical disc degeneration, cervicothoracic region: Secondary | ICD-10-CM | POA: Diagnosis not present

## 2017-02-17 DIAGNOSIS — M9901 Segmental and somatic dysfunction of cervical region: Secondary | ICD-10-CM | POA: Diagnosis not present

## 2017-02-17 DIAGNOSIS — M62830 Muscle spasm of back: Secondary | ICD-10-CM | POA: Diagnosis not present

## 2017-04-13 DIAGNOSIS — M9903 Segmental and somatic dysfunction of lumbar region: Secondary | ICD-10-CM | POA: Diagnosis not present

## 2017-04-13 DIAGNOSIS — M5033 Other cervical disc degeneration, cervicothoracic region: Secondary | ICD-10-CM | POA: Diagnosis not present

## 2017-04-13 DIAGNOSIS — M62830 Muscle spasm of back: Secondary | ICD-10-CM | POA: Diagnosis not present

## 2017-04-13 DIAGNOSIS — M9901 Segmental and somatic dysfunction of cervical region: Secondary | ICD-10-CM | POA: Diagnosis not present

## 2017-05-08 DIAGNOSIS — H2513 Age-related nuclear cataract, bilateral: Secondary | ICD-10-CM | POA: Diagnosis not present

## 2017-05-11 DIAGNOSIS — M62830 Muscle spasm of back: Secondary | ICD-10-CM | POA: Diagnosis not present

## 2017-05-11 DIAGNOSIS — M5033 Other cervical disc degeneration, cervicothoracic region: Secondary | ICD-10-CM | POA: Diagnosis not present

## 2017-05-11 DIAGNOSIS — M9901 Segmental and somatic dysfunction of cervical region: Secondary | ICD-10-CM | POA: Diagnosis not present

## 2017-05-11 DIAGNOSIS — M9903 Segmental and somatic dysfunction of lumbar region: Secondary | ICD-10-CM | POA: Diagnosis not present

## 2017-05-29 DIAGNOSIS — N390 Urinary tract infection, site not specified: Secondary | ICD-10-CM | POA: Diagnosis not present

## 2017-05-29 DIAGNOSIS — B373 Candidiasis of vulva and vagina: Secondary | ICD-10-CM | POA: Diagnosis not present

## 2017-05-29 DIAGNOSIS — R300 Dysuria: Secondary | ICD-10-CM | POA: Diagnosis not present

## 2017-05-29 DIAGNOSIS — A499 Bacterial infection, unspecified: Secondary | ICD-10-CM | POA: Diagnosis not present

## 2017-06-08 DIAGNOSIS — M9901 Segmental and somatic dysfunction of cervical region: Secondary | ICD-10-CM | POA: Diagnosis not present

## 2017-06-08 DIAGNOSIS — M9903 Segmental and somatic dysfunction of lumbar region: Secondary | ICD-10-CM | POA: Diagnosis not present

## 2017-06-08 DIAGNOSIS — M5033 Other cervical disc degeneration, cervicothoracic region: Secondary | ICD-10-CM | POA: Diagnosis not present

## 2017-06-08 DIAGNOSIS — M62830 Muscle spasm of back: Secondary | ICD-10-CM | POA: Diagnosis not present

## 2017-06-12 DIAGNOSIS — H2511 Age-related nuclear cataract, right eye: Secondary | ICD-10-CM | POA: Diagnosis not present

## 2017-06-16 ENCOUNTER — Encounter: Payer: Self-pay | Admitting: *Deleted

## 2017-06-24 ENCOUNTER — Ambulatory Visit: Payer: PPO | Admitting: Anesthesiology

## 2017-06-24 ENCOUNTER — Ambulatory Visit
Admission: RE | Admit: 2017-06-24 | Discharge: 2017-06-24 | Disposition: A | Discharge status: Home / Self Care | Payer: PPO | Source: Ambulatory Visit | Attending: Ophthalmology | Admitting: Ophthalmology

## 2017-06-24 ENCOUNTER — Encounter: Payer: Self-pay | Admitting: Emergency Medicine

## 2017-06-24 ENCOUNTER — Encounter
Admission: RE | Disposition: A | Discharge status: Home / Self Care | Payer: Self-pay | Source: Ambulatory Visit | Attending: Ophthalmology

## 2017-06-24 DIAGNOSIS — Z87891 Personal history of nicotine dependence: Secondary | ICD-10-CM | POA: Diagnosis not present

## 2017-06-24 DIAGNOSIS — H2511 Age-related nuclear cataract, right eye: Secondary | ICD-10-CM | POA: Diagnosis not present

## 2017-06-24 HISTORY — PX: CATARACT EXTRACTION W/PHACO: SHX586

## 2017-06-24 HISTORY — DX: Unspecified cataract: H26.9

## 2017-06-24 SURGERY — PHACOEMULSIFICATION, CATARACT, WITH IOL INSERTION
Anesthesia: Monitor Anesthesia Care | Site: Eye | Laterality: Right | Wound class: Clean

## 2017-06-24 MED ORDER — POLYMYXIN B-TRIMETHOPRIM 10000-0.1 UNIT/ML-% OP SOLN
OPHTHALMIC | Status: AC
  Filled 2017-06-24: qty 10

## 2017-06-24 MED ORDER — LIDOCAINE HCL (PF) 4 % IJ SOLN
INTRAMUSCULAR | Status: AC
  Filled 2017-06-24: qty 5

## 2017-06-24 MED ORDER — FENTANYL CITRATE (PF) 100 MCG/2ML IJ SOLN
INTRAMUSCULAR | Status: DC | PRN
  Administered 2017-06-24: 50 ug via INTRAVENOUS

## 2017-06-24 MED ORDER — EPINEPHRINE PF 1 MG/ML IJ SOLN
INTRAMUSCULAR | Status: AC
  Filled 2017-06-24: qty 2

## 2017-06-24 MED ORDER — FENTANYL CITRATE (PF) 100 MCG/2ML IJ SOLN
INTRAMUSCULAR | Status: AC
  Filled 2017-06-24: qty 2

## 2017-06-24 MED ORDER — MIDAZOLAM HCL 2 MG/2ML IJ SOLN
INTRAMUSCULAR | Status: DC | PRN
  Administered 2017-06-24: 1 mg via INTRAVENOUS

## 2017-06-24 MED ORDER — POVIDONE-IODINE 5 % OP SOLN
OPHTHALMIC | Status: AC
  Filled 2017-06-24: qty 30

## 2017-06-24 MED ORDER — SODIUM CHLORIDE 0.9 % IV SOLN
INTRAVENOUS | Status: DC
  Administered 2017-06-24 (×2): via INTRAVENOUS

## 2017-06-24 MED ORDER — MIDAZOLAM HCL 2 MG/2ML IJ SOLN
INTRAMUSCULAR | Status: AC
  Filled 2017-06-24: qty 2

## 2017-06-24 MED ORDER — POLYMYXIN B-TRIMETHOPRIM 10000-0.1 UNIT/ML-% OP SOLN
OPHTHALMIC | Status: DC | PRN
  Administered 2017-06-24: 2 [drp]

## 2017-06-24 MED ORDER — POLYMYXIN B-TRIMETHOPRIM 10000-0.1 UNIT/ML-% OP SOLN: 1.0000 [drp] | Freq: Once | OPHTHALMIC | Status: DC

## 2017-06-24 MED ORDER — ARMC OPHTHALMIC DILATING DROPS
OPHTHALMIC | Status: AC
  Administered 2017-06-24: 1 via OPHTHALMIC
  Filled 2017-06-24: qty 0.4

## 2017-06-24 MED ORDER — ARMC OPHTHALMIC DILATING DROPS
1.0000 "application " | OPHTHALMIC | Status: AC
  Administered 2017-06-24 (×3): 1 via OPHTHALMIC

## 2017-06-24 MED ORDER — NA CHONDROIT SULF-NA HYALURON 40-17 MG/ML IO SOLN
INTRAOCULAR | Status: AC
  Filled 2017-06-24: qty 1

## 2017-06-24 SURGICAL SUPPLY — 16 items
GLOVE BIO SURGEON STRL SZ8 (GLOVE) ×3 IMPLANT
GLOVE BIOGEL M 6.5 STRL (GLOVE) ×3 IMPLANT
GLOVE SURG LX 8.0 MICRO (GLOVE) ×2
GLOVE SURG LX STRL 8.0 MICRO (GLOVE) ×1 IMPLANT
GOWN STRL REUS W/ TWL LRG LVL3 (GOWN DISPOSABLE) ×2 IMPLANT
GOWN STRL REUS W/TWL LRG LVL3 (GOWN DISPOSABLE) ×4
LABEL CATARACT MEDS ST (LABEL) ×3 IMPLANT
LENS IOL TECNIS ITEC 22.0 (Intraocular Lens) ×3 IMPLANT
PACK CATARACT (MISCELLANEOUS) ×3 IMPLANT
PACK CATARACT BRASINGTON LX (MISCELLANEOUS) ×3 IMPLANT
PACK EYE AFTER SURG (MISCELLANEOUS) ×3 IMPLANT
SOL BSS BAG (MISCELLANEOUS) ×3
SOLUTION BSS BAG (MISCELLANEOUS) ×1 IMPLANT
SYR 5ML LL (SYRINGE) ×3 IMPLANT
WATER STERILE IRR 250ML POUR (IV SOLUTION) ×3 IMPLANT
WIPE NON LINTING 3.25X3.25 (MISCELLANEOUS) ×3 IMPLANT

## 2017-06-24 NOTE — Anesthesia Preprocedure Evaluation (Signed)
Anesthesia Evaluation  Patient identified by MRN, date of birth, ID band Patient awake    Reviewed: Allergy & Precautions, H&P , NPO status , reviewed documented beta blocker date and time   Airway Mallampati: II  TM Distance: >3 FB     Dental  (+) Chipped   Pulmonary neg pulmonary ROS, former smoker,    Pulmonary exam normal        Cardiovascular Normal cardiovascular exam     Neuro/Psych negative neurological ROS     GI/Hepatic Neg liver ROS, neg GERD  ,  Endo/Other  negative endocrine ROS  Renal/GU negative Renal ROS  negative genitourinary   Musculoskeletal   Abdominal   Peds  Hematology negative hematology ROS (+)   Anesthesia Other Findings   Reproductive/Obstetrics                             Anesthesia Physical Anesthesia Plan  ASA: II  Anesthesia Plan: MAC   Post-op Pain Management:    Induction:   PONV Risk Score and Plan: 2 and Propofol infusion, Midazolam and Ondansetron  Airway Management Planned:   Additional Equipment:   Intra-op Plan:   Post-operative Plan:   Informed Consent: I have reviewed the patients History and Physical, chart, labs and discussed the procedure including the risks, benefits and alternatives for the proposed anesthesia with the patient or authorized representative who has indicated his/her understanding and acceptance.   Dental Advisory Given  Plan Discussed with: CRNA  Anesthesia Plan Comments:         Anesthesia Quick Evaluation

## 2017-06-24 NOTE — Anesthesia Post-op Follow-up Note (Signed)
Anesthesia QCDR form completed.        

## 2017-06-24 NOTE — H&P (Signed)
All labs reviewed. Abnormal studies sent to patients PCP when indicated.  Previous H&P reviewed, patient examined, there are NO CHANGES.  Abigail Ehler Porfilio3/13/201910:27 AM

## 2017-06-24 NOTE — Transfer of Care (Signed)
Immediate Anesthesia Transfer of Care Note  Patient: Abigail Hatfield  Procedure(s) Performed: CATARACT EXTRACTION PHACO AND INTRAOCULAR LENS PLACEMENT (IOC) (Right Eye)  Patient Location: PACU  Anesthesia Type:MAC  Level of Consciousness: awake  Airway & Oxygen Therapy: Patient Spontanous Breathing  Post-op Assessment: Report given to RN  Post vital signs: Reviewed and stable  Last Vitals:  Vitals:   06/24/17 0901  BP: (!) 142/56  Pulse: 67  Resp: 17  Temp: (!) 35.7 C  SpO2: 100%    Last Pain:  Vitals:   06/24/17 0901  TempSrc: Tympanic         Complications: No apparent anesthesia complications

## 2017-06-24 NOTE — Op Note (Signed)
PREOPERATIVE DIAGNOSIS:  Nuclear sclerotic cataract of the right eye.   POSTOPERATIVE DIAGNOSIS: NUCLEAR SCLEROTIC CATARACT RIGHT EYE   OPERATIVE PROCEDURE:  Procedure(s): CATARACT EXTRACTION PHACO AND INTRAOCULAR LENS PLACEMENT (IOC)   SURGEON:  Birder Robson, MD.   ANESTHESIA:  Anesthesiologist: Alphonsus Sias, MD CRNA: Allean Found, CRNA  1.      Managed anesthesia care. 2.      Topical tetracaine drops followed by 2% Xylocaine jelly applied in the preoperative holding area.   COMPLICATIONS:  None.   TECHNIQUE:   Stop and chop   DESCRIPTION OF PROCEDURE:  The patient was examined and consented in the preoperative holding area where the aforementioned topical anesthesia was applied to the right eye and then brought back to the Operating Room where the right eye was prepped and draped in the usual sterile ophthalmic fashion and a lid speculum was placed. A paracentesis was created with the side port blade and the anterior chamber was filled with viscoelastic. A near clear corneal incision was performed with the steel keratome. A continuous curvilinear capsulorrhexis was performed with a cystotome followed by the capsulorrhexis forceps. Hydrodissection and hydrodelineation were carried out with BSS on a blunt cannula. The lens was removed in a stop and chop  technique and the remaining cortical material was removed with the irrigation-aspiration handpiece. The capsular bag was inflated with viscoelastic and the Technis ZCB00  lens was placed in the capsular bag without complication. The remaining viscoelastic was removed from the eye with the irrigation-aspiration handpiece. The wounds were hydrated. The anterior chamber was flushed with Miostat and the eye was inflated to physiologic pressure.  The wounds were found to be water tight. The eye was dressed with Polytrim. The patient was given protective glasses to wear throughout the day and a shield with which to sleep tonight. The patient  was also given drops with which to begin a drop regimen today and will follow-up with me in one day. Implant Name Type Inv. Item Serial No. Manufacturer Lot No. LRB No. Used  LENS IOL DIOP 22.0 - [id number] Intraocular Lens LENS IOL DIOP 22.0 [id number] AMO  Right 1   Procedure(s) with comments: CATARACT EXTRACTION PHACO AND INTRAOCULAR LENS PLACEMENT (IOC) (Right) - [place] 00:48.0 AP% 16.5 CDE 7.92 Fluid Pack Lot # [id number]  Electronically signed: Birder Robson 06/24/2017 10:54 AM

## 2017-06-24 NOTE — Discharge Instructions (Signed)
Eye Surgery Discharge Instructions  Expect mild scratchy sensation or mild soreness. DO NOT RUB YOUR EYE!  The day of surgery:  Minimal physical activity, but bed rest is not required  No reading, computer work, or close hand work  No bending, lifting, or straining.  May watch TV  For 24 hours:  No driving, legal decisions, or alcoholic beverages  Safety precautions  Eat anything you prefer: It is better to start with liquids, then soup then solid foods.  _____ Eye patch should be worn until postoperative exam tomorrow.  ____ Solar shield eyeglasses should be worn for comfort in the sunlight/patch while sleeping  Resume all regular medications including aspirin or Coumadin if these were discontinued prior to surgery. You may shower, bathe, shave, or wash your hair. Tylenol may be taken for mild discomfort.  Call your doctor if you experience significant pain, nausea, or vomiting, fever > 101 or other signs of infection. [phone] or [phone] Specific instructions:  Follow-up Information    Birder Robson, MD Follow up.   Specialty:  Ophthalmology Why:  [id number] at 10:00am Contact information: [place] [address] [phone]

## 2017-06-24 NOTE — Anesthesia Procedure Notes (Signed)
Procedure Name: MAC Date/Time: 06/24/2017 10:35 AM Performed by: Allean Found, CRNA Pre-anesthesia Checklist: Patient identified, Emergency Drugs available, Suction available, Patient being monitored and Timeout performed Patient Re-evaluated:Patient Re-evaluated prior to induction Oxygen Delivery Method: Nasal cannula Placement Confirmation: positive ETCO2

## 2017-06-24 NOTE — Anesthesia Postprocedure Evaluation (Signed)
Anesthesia Post Note  Patient: Abigail Hatfield  Procedure(s) Performed: CATARACT EXTRACTION PHACO AND INTRAOCULAR LENS PLACEMENT ([place]) (Right Eye)  Patient location during evaluation: Short Stay Anesthesia Type: MAC Level of consciousness: awake Pain management: pain level controlled Vital Signs Assessment: post-procedure vital signs reviewed and stable Respiratory status: spontaneous breathing Cardiovascular status: blood pressure returned to baseline Postop Assessment: no headache Anesthetic complications: no     Last Vitals:  Vitals:   06/24/17 0901  BP: (!) 142/56  Pulse: 67  Resp: 17  Temp: (!) 35.7 C  SpO2: 100%    Last Pain:  Vitals:   06/24/17 0901  TempSrc: Tympanic                 Buckner Malta

## 2017-07-06 DIAGNOSIS — M9901 Segmental and somatic dysfunction of cervical region: Secondary | ICD-10-CM | POA: Diagnosis not present

## 2017-07-06 DIAGNOSIS — M62830 Muscle spasm of back: Secondary | ICD-10-CM | POA: Diagnosis not present

## 2017-07-06 DIAGNOSIS — M9903 Segmental and somatic dysfunction of lumbar region: Secondary | ICD-10-CM | POA: Diagnosis not present

## 2017-07-06 DIAGNOSIS — M5033 Other cervical disc degeneration, cervicothoracic region: Secondary | ICD-10-CM | POA: Diagnosis not present

## 2017-07-07 DIAGNOSIS — H2512 Age-related nuclear cataract, left eye: Secondary | ICD-10-CM | POA: Diagnosis not present

## 2017-07-13 ENCOUNTER — Encounter: Payer: Self-pay | Admitting: *Deleted

## 2017-07-14 ENCOUNTER — Encounter
Admission: RE | Disposition: A | Discharge status: Home / Self Care | Payer: Self-pay | Source: Ambulatory Visit | Attending: Ophthalmology

## 2017-07-14 ENCOUNTER — Ambulatory Visit: Payer: PPO | Admitting: Anesthesiology

## 2017-07-14 ENCOUNTER — Encounter: Payer: Self-pay | Admitting: Certified Registered Nurse Anesthetist

## 2017-07-14 ENCOUNTER — Ambulatory Visit
Admission: RE | Admit: 2017-07-14 | Discharge: 2017-07-14 | Disposition: A | Discharge status: Home / Self Care | Payer: PPO | Source: Ambulatory Visit | Attending: Ophthalmology | Admitting: Ophthalmology

## 2017-07-14 DIAGNOSIS — Z880 Allergy status to penicillin: Secondary | ICD-10-CM | POA: Insufficient documentation

## 2017-07-14 DIAGNOSIS — Z87891 Personal history of nicotine dependence: Secondary | ICD-10-CM | POA: Diagnosis not present

## 2017-07-14 DIAGNOSIS — H2512 Age-related nuclear cataract, left eye: Secondary | ICD-10-CM | POA: Diagnosis not present

## 2017-07-14 DIAGNOSIS — Z888 Allergy status to other drugs, medicaments and biological substances status: Secondary | ICD-10-CM | POA: Diagnosis not present

## 2017-07-14 HISTORY — PX: CATARACT EXTRACTION W/PHACO: SHX586

## 2017-07-14 HISTORY — DX: Other specified health status: Z78.9

## 2017-07-14 SURGERY — PHACOEMULSIFICATION, CATARACT, WITH IOL INSERTION
Anesthesia: Monitor Anesthesia Care | Site: Eye | Laterality: Left | Wound class: "Clean "

## 2017-07-14 MED ORDER — EPINEPHRINE PF 1 MG/ML IJ SOLN
INTRAMUSCULAR | Status: AC
  Filled 2017-07-14: qty 2

## 2017-07-14 MED ORDER — CARBACHOL 0.01 % IO SOLN
INTRAOCULAR | Status: DC | PRN
  Administered 2017-07-14: 0.5 mL via INTRAOCULAR

## 2017-07-14 MED ORDER — NA CHONDROIT SULF-NA HYALURON 40-17 MG/ML IO SOLN
INTRAOCULAR | Status: DC | PRN
  Administered 2017-07-14: 1 mL via INTRAOCULAR

## 2017-07-14 MED ORDER — LIDOCAINE HCL (PF) 4 % IJ SOLN
INTRAOCULAR | Status: DC | PRN
  Administered 2017-07-14: 4 mL via OPHTHALMIC

## 2017-07-14 MED ORDER — POLYMYXIN B-TRIMETHOPRIM 10000-0.1 UNIT/ML-% OP SOLN
OPHTHALMIC | Status: DC | PRN
  Administered 2017-07-14: 2 [drp]

## 2017-07-14 MED ORDER — LIDOCAINE HCL (PF) 4 % IJ SOLN
INTRAMUSCULAR | Status: AC
  Filled 2017-07-14: qty 5

## 2017-07-14 MED ORDER — POLYMYXIN B-TRIMETHOPRIM 10000-0.1 UNIT/ML-% OP SOLN: 1.0000 [drp] | OPHTHALMIC | Status: DC

## 2017-07-14 MED ORDER — NA CHONDROIT SULF-NA HYALURON 40-17 MG/ML IO SOLN
INTRAOCULAR | Status: AC
  Filled 2017-07-14: qty 1

## 2017-07-14 MED ORDER — ARMC OPHTHALMIC DILATING DROPS
1.0000 "application " | OPHTHALMIC | Status: AC
  Administered 2017-07-14: 1 via OPHTHALMIC
  Administered 2017-07-14: 12:00:00 via OPHTHALMIC
  Administered 2017-07-14: 1 via OPHTHALMIC

## 2017-07-14 MED ORDER — DEXMEDETOMIDINE HCL 200 MCG/2ML IV SOLN
INTRAVENOUS | Status: DC | PRN
  Administered 2017-07-14 (×2): 4 ug via INTRAVENOUS

## 2017-07-14 MED ORDER — EPINEPHRINE PF 1 MG/ML IJ SOLN
INTRAOCULAR | Status: DC | PRN
  Administered 2017-07-14: 12:00:00 via OPHTHALMIC

## 2017-07-14 MED ORDER — POLYMYXIN B-TRIMETHOPRIM 10000-0.1 UNIT/ML-% OP SOLN
OPHTHALMIC | Status: AC
  Filled 2017-07-14: qty 10

## 2017-07-14 MED ORDER — SODIUM CHLORIDE 0.9 % IV SOLN
INTRAVENOUS | Status: DC
  Administered 2017-07-14: 12:00:00 via INTRAVENOUS

## 2017-07-14 MED ORDER — POVIDONE-IODINE 5 % OP SOLN
OPHTHALMIC | Status: AC
  Filled 2017-07-14: qty 30

## 2017-07-14 MED ORDER — POVIDONE-IODINE 5 % OP SOLN
OPHTHALMIC | Status: DC | PRN
  Administered 2017-07-14: 1 via OPHTHALMIC

## 2017-07-14 MED ORDER — ARMC OPHTHALMIC DILATING DROPS
OPHTHALMIC | Status: AC
  Filled 2017-07-14: qty 0.4

## 2017-07-14 SURGICAL SUPPLY — 16 items
GLOVE BIO SURGEON STRL SZ8 (GLOVE) ×3 IMPLANT
GLOVE BIOGEL M 6.5 STRL (GLOVE) ×3 IMPLANT
GLOVE SURG LX 8.0 MICRO (GLOVE) ×2
GLOVE SURG LX STRL 8.0 MICRO (GLOVE) ×1 IMPLANT
GOWN STRL REUS W/ TWL LRG LVL3 (GOWN DISPOSABLE) ×2 IMPLANT
GOWN STRL REUS W/TWL LRG LVL3 (GOWN DISPOSABLE) ×4
LABEL CATARACT MEDS ST (LABEL) ×3 IMPLANT
LENS IOL TECNIS ITEC 22.0 (Intraocular Lens) ×2 IMPLANT
PACK CATARACT (MISCELLANEOUS) ×3 IMPLANT
PACK CATARACT BRASINGTON LX (MISCELLANEOUS) ×3 IMPLANT
PACK EYE AFTER SURG (MISCELLANEOUS) ×3 IMPLANT
SOL BSS BAG (MISCELLANEOUS) ×3
SOLUTION BSS BAG (MISCELLANEOUS) ×1 IMPLANT
SYR 5ML LL (SYRINGE) ×3 IMPLANT
WATER STERILE IRR 250ML POUR (IV SOLUTION) ×3 IMPLANT
WIPE NON LINTING 3.25X3.25 (MISCELLANEOUS) ×3 IMPLANT

## 2017-07-14 NOTE — Transfer of Care (Signed)
Immediate Anesthesia Transfer of Care Note  Patient: Abigail Hatfield  Procedure(s) Performed: CATARACT EXTRACTION PHACO AND INTRAOCULAR LENS PLACEMENT (IOC) (Left Eye)  Patient Location: Short Stay  Anesthesia Type:MAC  Level of Consciousness: awake, alert , oriented and patient cooperative  Airway & Oxygen Therapy: Patient Spontanous Breathing  Post-op Assessment: Report given to RN and Post -op Vital signs reviewed and stable  Post vital signs: Reviewed and stable  Last Vitals:  Vitals Value Taken Time  BP    Temp    Pulse    Resp    SpO2      Last Pain:  Vitals:   07/14/17 1121  TempSrc: Tympanic  PainSc: 0-No pain         Complications: No apparent anesthesia complications

## 2017-07-14 NOTE — Anesthesia Postprocedure Evaluation (Signed)
Anesthesia Post Note  Patient: Abigail Hatfield  Procedure(s) Performed: CATARACT EXTRACTION PHACO AND INTRAOCULAR LENS PLACEMENT (IOC) (Left Eye)  Patient location during evaluation: PACU Anesthesia Type: MAC Level of consciousness: awake and alert Pain management: pain level controlled Vital Signs Assessment: post-procedure vital signs reviewed and stable Respiratory status: spontaneous breathing, nonlabored ventilation and respiratory function stable Cardiovascular status: blood pressure returned to baseline and stable Postop Assessment: no apparent nausea or vomiting Anesthetic complications: no     Last Vitals:  Vitals:   07/14/17 1238 07/14/17 1243  BP: (!) 102/48 (!) 102/55  Pulse: 63 62  Resp: 16 16  Temp:    SpO2: 99% 98%    Last Pain:  Vitals:   07/14/17 1243  TempSrc:   PainSc: 0-No pain                 Alphonsus Sias

## 2017-07-14 NOTE — Op Note (Signed)
PREOPERATIVE DIAGNOSIS:  Nuclear sclerotic cataract of the left eye.   POSTOPERATIVE DIAGNOSIS:  Nuclear sclerotic cataract of the left eye.   OPERATIVE PROCEDURE: Procedure(s): CATARACT EXTRACTION PHACO AND INTRAOCULAR LENS PLACEMENT (IOC)   SURGEON:  Birder Robson, MD.   ANESTHESIA:  Anesthesiologist: Alphonsus Sias, MD CRNA: Eben Burow, CRNA  1.      Managed anesthesia care. 2.     0.5ml of Shugarcaine was instilled following the paracentesis   COMPLICATIONS:  None.   TECHNIQUE:   Stop and chop   DESCRIPTION OF PROCEDURE:  The patient was examined and consented in the preoperative holding area where the aforementioned topical anesthesia was applied to the left eye and then brought back to the Operating Room where the left eye was prepped and draped in the usual sterile ophthalmic fashion and a lid speculum was placed. A paracentesis was created with the side port blade and the anterior chamber was filled with viscoelastic. A near clear corneal incision was performed with the steel keratome. A continuous curvilinear capsulorrhexis was performed with a cystotome followed by the capsulorrhexis forceps. Hydrodissection and hydrodelineation were carried out with BSS on a blunt cannula. The lens was removed in a stop and chop  technique and the remaining cortical material was removed with the irrigation-aspiration handpiece. The capsular bag was inflated with viscoelastic and the Technis ZCB00 lens was placed in the capsular bag without complication. The remaining viscoelastic was removed from the eye with the irrigation-aspiration handpiece. The wounds were hydrated. The anterior chamber was flushed with Miostat and the eye was inflated to physiologic pressure. . The wounds were found to be water tight. The eye was dressed with polytrim. The patient was given protective glasses to wear throughout the day and a shield with which to sleep tonight. The patient was also given drops with which  to begin a drop regimen today and will follow-up with me in one day. Implant Name Type Inv. Item Serial No. Manufacturer Lot No. LRB No. Used  LENS IOL DIOP 22.0 - [id number] Intraocular Lens LENS IOL DIOP 22.0 [id number] AMO  Left 1    Procedure(s) with comments: CATARACT EXTRACTION PHACO AND INTRAOCULAR LENS PLACEMENT (IOC) (Left) - [place] 00:36.0 AP% 16.9 CDE 6.08 Fluid Pack Lot # [id number]  Electronically signed: Birder Robson 07/14/2017 12:21 PM

## 2017-07-14 NOTE — H&P (Signed)
All labs reviewed. Abnormal studies sent to patients PCP when indicated.  Previous H&P reviewed, patient examined, there are NO CHANGES.  Abigail Hatfield Porfilio4/2/201911:59 AM

## 2017-07-14 NOTE — Anesthesia Post-op Follow-up Note (Signed)
Anesthesia QCDR form completed.        

## 2017-07-14 NOTE — Anesthesia Preprocedure Evaluation (Signed)
Anesthesia Evaluation  Patient identified by MRN, date of birth, ID band Patient awake    Reviewed: Allergy & Precautions, H&P , NPO status , reviewed documented beta blocker date and time   Airway Mallampati: II       Dental  (+) Chipped   Pulmonary former smoker,    Pulmonary exam normal        Cardiovascular negative cardio ROS Normal cardiovascular exam     Neuro/Psych negative neurological ROS  negative psych ROS   GI/Hepatic negative GI ROS, Neg liver ROS, neg GERD  ,  Endo/Other  negative endocrine ROS  Renal/GU negative Renal ROS  negative genitourinary   Musculoskeletal   Abdominal   Peds  Hematology negative hematology ROS (+)   Anesthesia Other Findings   Reproductive/Obstetrics negative OB ROS                             Anesthesia Physical Anesthesia Plan  ASA: II  Anesthesia Plan: MAC   Post-op Pain Management:    Induction:   PONV Risk Score and Plan: TIVA  Airway Management Planned:   Additional Equipment:   Intra-op Plan:   Post-operative Plan:   Informed Consent: I have reviewed the patients History and Physical, chart, labs and discussed the procedure including the risks, benefits and alternatives for the proposed anesthesia with the patient or authorized representative who has indicated his/her understanding and acceptance.   Dental Advisory Given  Plan Discussed with: CRNA  Anesthesia Plan Comments:         Anesthesia Quick Evaluation

## 2017-07-14 NOTE — Discharge Instructions (Signed)
Eye Surgery Discharge Instructions  Expect mild scratchy sensation or mild soreness. DO NOT RUB YOUR EYE!  The day of surgery:  Minimal physical activity, but bed rest is not required  No reading, computer work, or close hand work  No bending, lifting, or straining.  May watch TV  For 24 hours:  No driving, legal decisions, or alcoholic beverages  Safety precautions  Eat anything you prefer: It is better to start with liquids, then soup then solid foods.  _____ Eye patch should be worn until postoperative exam tomorrow.  ____ Solar shield eyeglasses should be worn for comfort in the sunlight/patch while sleeping  Resume all regular medications including aspirin or Coumadin if these were discontinued prior to surgery. You may shower, bathe, shave, or wash your hair. Tylenol may be taken for mild discomfort.  Call your doctor if you experience significant pain, nausea, or vomiting, fever > 101 or other signs of infection. [phone] or [phone] Specific instructions:  Follow-up Information    Birder Robson, MD Follow up in 1 day(s).   Specialty:  Ophthalmology Why:  07/15/2017 @11 :00 Contact information: [address] [place] [address] [phone]          Eye Surgery Discharge Instructions  Expect mild scratchy sensation or mild soreness. DO NOT RUB YOUR EYE!  The day of surgery:  Minimal physical activity, but bed rest is not required  No reading, computer work, or close hand work  No bending, lifting, or straining.  May watch TV  For 24 hours:  No driving, legal decisions, or alcoholic beverages  Safety precautions  Eat anything you prefer: It is better to start with liquids, then soup then solid foods.  _____ Eye patch should be worn until postoperative exam tomorrow.  ____ Solar shield eyeglasses should be worn for comfort in the sunlight/patch while sleeping  Resume all regular medications including aspirin or Coumadin if  these were discontinued prior to surgery. You may shower, bathe, shave, or wash your hair. Tylenol may be taken for mild discomfort.  Call your doctor if you experience significant pain, nausea, or vomiting, fever > 101 or other signs of infection. [phone] or [phone] Specific instructions:  Follow-up Information    Birder Robson, MD Follow up in 1 day(s).   Specialty:  Ophthalmology Why:  07/15/2017 @11 :00 Contact information: [place] [address] [phone]

## 2017-08-11 DIAGNOSIS — M9901 Segmental and somatic dysfunction of cervical region: Secondary | ICD-10-CM | POA: Diagnosis not present

## 2017-08-11 DIAGNOSIS — M62830 Muscle spasm of back: Secondary | ICD-10-CM | POA: Diagnosis not present

## 2017-08-11 DIAGNOSIS — M9903 Segmental and somatic dysfunction of lumbar region: Secondary | ICD-10-CM | POA: Diagnosis not present

## 2017-08-11 DIAGNOSIS — M5033 Other cervical disc degeneration, cervicothoracic region: Secondary | ICD-10-CM | POA: Diagnosis not present

## 2017-09-08 DIAGNOSIS — M62830 Muscle spasm of back: Secondary | ICD-10-CM | POA: Diagnosis not present

## 2017-09-08 DIAGNOSIS — M9901 Segmental and somatic dysfunction of cervical region: Secondary | ICD-10-CM | POA: Diagnosis not present

## 2017-09-08 DIAGNOSIS — M9903 Segmental and somatic dysfunction of lumbar region: Secondary | ICD-10-CM | POA: Diagnosis not present

## 2017-09-08 DIAGNOSIS — M5033 Other cervical disc degeneration, cervicothoracic region: Secondary | ICD-10-CM | POA: Diagnosis not present

## 2017-09-28 DIAGNOSIS — D692 Other nonthrombocytopenic purpura: Secondary | ICD-10-CM | POA: Diagnosis not present

## 2017-09-28 DIAGNOSIS — L821 Other seborrheic keratosis: Secondary | ICD-10-CM | POA: Diagnosis not present

## 2017-09-28 DIAGNOSIS — L578 Other skin changes due to chronic exposure to nonionizing radiation: Secondary | ICD-10-CM | POA: Diagnosis not present

## 2017-09-28 DIAGNOSIS — L309 Dermatitis, unspecified: Secondary | ICD-10-CM | POA: Diagnosis not present

## 2017-09-28 DIAGNOSIS — L812 Freckles: Secondary | ICD-10-CM | POA: Diagnosis not present

## 2017-09-28 DIAGNOSIS — D1800 Hemangioma unspecified site: Secondary | ICD-10-CM | POA: Diagnosis not present

## 2017-09-28 DIAGNOSIS — L570 Actinic keratosis: Secondary | ICD-10-CM | POA: Diagnosis not present

## 2017-10-06 DIAGNOSIS — M62830 Muscle spasm of back: Secondary | ICD-10-CM | POA: Diagnosis not present

## 2017-10-06 DIAGNOSIS — M5033 Other cervical disc degeneration, cervicothoracic region: Secondary | ICD-10-CM | POA: Diagnosis not present

## 2017-10-06 DIAGNOSIS — M9903 Segmental and somatic dysfunction of lumbar region: Secondary | ICD-10-CM | POA: Diagnosis not present

## 2017-10-06 DIAGNOSIS — M9901 Segmental and somatic dysfunction of cervical region: Secondary | ICD-10-CM | POA: Diagnosis not present

## 2017-10-07 DIAGNOSIS — M5033 Other cervical disc degeneration, cervicothoracic region: Secondary | ICD-10-CM | POA: Diagnosis not present

## 2017-10-07 DIAGNOSIS — M9901 Segmental and somatic dysfunction of cervical region: Secondary | ICD-10-CM | POA: Diagnosis not present

## 2017-10-07 DIAGNOSIS — M9903 Segmental and somatic dysfunction of lumbar region: Secondary | ICD-10-CM | POA: Diagnosis not present

## 2017-10-07 DIAGNOSIS — M62830 Muscle spasm of back: Secondary | ICD-10-CM | POA: Diagnosis not present

## 2017-10-12 DIAGNOSIS — M9901 Segmental and somatic dysfunction of cervical region: Secondary | ICD-10-CM | POA: Diagnosis not present

## 2017-10-12 DIAGNOSIS — M9903 Segmental and somatic dysfunction of lumbar region: Secondary | ICD-10-CM | POA: Diagnosis not present

## 2017-10-12 DIAGNOSIS — M62830 Muscle spasm of back: Secondary | ICD-10-CM | POA: Diagnosis not present

## 2017-10-12 DIAGNOSIS — M5033 Other cervical disc degeneration, cervicothoracic region: Secondary | ICD-10-CM | POA: Diagnosis not present

## 2017-11-11 DIAGNOSIS — M9903 Segmental and somatic dysfunction of lumbar region: Secondary | ICD-10-CM | POA: Diagnosis not present

## 2017-11-11 DIAGNOSIS — M9901 Segmental and somatic dysfunction of cervical region: Secondary | ICD-10-CM | POA: Diagnosis not present

## 2017-11-11 DIAGNOSIS — M62830 Muscle spasm of back: Secondary | ICD-10-CM | POA: Diagnosis not present

## 2017-11-11 DIAGNOSIS — M5033 Other cervical disc degeneration, cervicothoracic region: Secondary | ICD-10-CM | POA: Diagnosis not present

## 2017-11-12 ENCOUNTER — Telehealth: Payer: Self-pay

## 2017-11-12 NOTE — Telephone Encounter (Signed)
Hey girl!! I made some calls this morning and will try to make some this afternoon, fingers crossed. Looks like we need 1 for the 16th, 1 for the 20th

## 2017-12-04 ENCOUNTER — Ambulatory Visit: Payer: PPO

## 2017-12-07 ENCOUNTER — Encounter: Payer: Self-pay | Admitting: Family Medicine

## 2017-12-07 ENCOUNTER — Other Ambulatory Visit: Payer: Self-pay

## 2017-12-07 ENCOUNTER — Other Ambulatory Visit: Payer: Self-pay | Admitting: Family Medicine

## 2017-12-07 ENCOUNTER — Telehealth: Payer: Self-pay

## 2017-12-07 ENCOUNTER — Ambulatory Visit (INDEPENDENT_AMBULATORY_CARE_PROVIDER_SITE_OTHER): Payer: PPO | Admitting: Family Medicine

## 2017-12-07 VITALS — BP 112/68 | HR 71 | Temp 98.5°F | Resp 16 | Ht 60.0 in | Wt 158.7 lb

## 2017-12-07 DIAGNOSIS — E7800 Pure hypercholesterolemia, unspecified: Secondary | ICD-10-CM

## 2017-12-07 DIAGNOSIS — E2839 Other primary ovarian failure: Secondary | ICD-10-CM

## 2017-12-07 DIAGNOSIS — Z1231 Encounter for screening mammogram for malignant neoplasm of breast: Secondary | ICD-10-CM

## 2017-12-07 DIAGNOSIS — M51379 Other intervertebral disc degeneration, lumbosacral region without mention of lumbar back pain or lower extremity pain: Secondary | ICD-10-CM | POA: Insufficient documentation

## 2017-12-07 DIAGNOSIS — Z01419 Encounter for gynecological examination (general) (routine) without abnormal findings: Secondary | ICD-10-CM

## 2017-12-07 DIAGNOSIS — M5137 Other intervertebral disc degeneration, lumbosacral region: Secondary | ICD-10-CM | POA: Insufficient documentation

## 2017-12-07 DIAGNOSIS — D692 Other nonthrombocytopenic purpura: Secondary | ICD-10-CM | POA: Insufficient documentation

## 2017-12-07 DIAGNOSIS — Z1211 Encounter for screening for malignant neoplasm of colon: Secondary | ICD-10-CM | POA: Diagnosis not present

## 2017-12-07 DIAGNOSIS — E66811 Obesity, class 1: Secondary | ICD-10-CM | POA: Insufficient documentation

## 2017-12-07 DIAGNOSIS — Z23 Encounter for immunization: Secondary | ICD-10-CM

## 2017-12-07 DIAGNOSIS — E669 Obesity, unspecified: Secondary | ICD-10-CM

## 2017-12-07 DIAGNOSIS — M479 Spondylosis, unspecified: Secondary | ICD-10-CM | POA: Insufficient documentation

## 2017-12-07 LAB — LIPID PANEL
CHOL/HDL RATIO: 3.1 (calc) (ref <5.0)
Cholesterol: 239 mg/dL — ABNORMAL HIGH (ref <200)
HDL: 78 mg/dL (ref >50)
LDL CHOLESTEROL (CALC): 141 mg/dL — AB
Non-HDL Cholesterol (Calc): 161 mg/dL (calc) — ABNORMAL HIGH (ref <130)
Triglycerides: 90 mg/dL (ref <150)

## 2017-12-07 NOTE — Telephone Encounter (Signed)
Copied from [place] [id number]. Topic: Inquiry >> Dec 07, 2017  1:46 PM Scherrie Gerlach wrote: Reason for CRM: pt states she found out her insurance will pay for an EKG. Does Dr Ancil Boozer want her to come back for an ekg? If so, will she be charged since this should have been included with her wellness?   Also, pt states she is only 5 ft tall.  AVS summary stated 5'1".   She would like to have this corrected.

## 2017-12-07 NOTE — Telephone Encounter (Signed)
Patient states she will be back tomorrow at 2:30 (12/08/17) and height was corrected on today's visit.

## 2017-12-07 NOTE — Patient Instructions (Signed)
Preventive Care 73 Years and Older, Female Preventive care refers to lifestyle choices and visits with your health care provider that can promote health and wellness. What does preventive care include?  A yearly physical exam. This is also called an annual well check.  Dental exams once or twice a year.  Routine eye exams. Ask your health care provider how often you should have your eyes checked.  Personal lifestyle choices, including: ? Daily care of your teeth and gums. ? Regular physical activity. ? Eating a healthy diet. ? Avoiding tobacco and drug use. ? Limiting alcohol use. ? Practicing safe sex. ? Taking low-dose aspirin every day. ? Taking vitamin and mineral supplements as recommended by your health care provider. What happens during an annual well check? The services and screenings done by your health care provider during your annual well check will depend on your age, overall health, lifestyle risk factors, and family history of disease. Counseling Your health care provider may ask you questions about your:  Alcohol use.  Tobacco use.  Drug use.  Emotional well-being.  Home and relationship well-being.  Sexual activity.  Eating habits.  History of falls.  Memory and ability to understand (cognition).  Work and work environment.  Reproductive health.  Screening You may have the following tests or measurements:  Height, weight, and BMI.  Blood pressure.  Lipid and cholesterol levels. These may be checked every 5 years, or more frequently if you are over 50 years old.  Skin check.  Lung cancer screening. You may have this screening every year starting at age 55 if you have a 30-pack-year history of smoking and currently smoke or have quit within the past 15 years.  Fecal occult blood test (FOBT) of the stool. You may have this test every year starting at age 50.  Flexible sigmoidoscopy or colonoscopy. You may have a sigmoidoscopy every 5 years or  a colonoscopy every 10 years starting at age 50.  Hepatitis C blood test.  Hepatitis B blood test.  Sexually transmitted disease (STD) testing.  Diabetes screening. This is done by checking your blood sugar (glucose) after you have not eaten for a while (fasting). You may have this done every 1-3 years.  Bone density scan. This is done to screen for osteoporosis. You may have this done starting at age 73.  Mammogram. This may be done every 1-2 years. Talk to your health care provider about how often you should have regular mammograms.  Talk with your health care provider about your test results, treatment options, and if necessary, the need for more tests. Vaccines Your health care provider may recommend certain vaccines, such as:  Influenza vaccine. This is recommended every year.  Tetanus, diphtheria, and acellular pertussis (Tdap, Td) vaccine. You may need a Td booster every 10 years.  Varicella vaccine. You may need this if you have not been vaccinated.  Zoster vaccine. You may need this after age 60.  Measles, mumps, and rubella (MMR) vaccine. You may need at least one dose of MMR if you were born in 1957 or later. You may also need a second dose.  Pneumococcal 13-valent conjugate (PCV13) vaccine. One dose is recommended after age 73.  Pneumococcal polysaccharide (PPSV23) vaccine. One dose is recommended after age 73.  Meningococcal vaccine. You may need this if you have certain conditions.  Hepatitis A vaccine. You may need this if you have certain conditions or if you travel or work in places where you may be exposed to hepatitis   A.  Hepatitis B vaccine. You may need this if you have certain conditions or if you travel or work in places where you may be exposed to hepatitis B.  Haemophilus influenzae type b (Hib) vaccine. You may need this if you have certain conditions.  Talk to your health care provider about which screenings and vaccines you need and how often you  need them. This information is not intended to replace advice given to you by your health care provider. Make sure you discuss any questions you have with your health care provider. Document Released: 04/27/2015 Document Revised: 12/19/2015 Document Reviewed: 01/30/2015 Elsevier Interactive Patient Education  2018 Elsevier Inc[employer]  

## 2017-12-07 NOTE — Progress Notes (Signed)
Name: Abigail Hatfield   MRN: [id number]    DOB: 11-Feb-1946   Date:12/07/2017       Progress Note  Subjective  Chief Complaint  Chief Complaint  Patient presents with  . Annual Exam  . Labs Only    patient is fasting  . Immunizations    patient declines all vaccines    HPI  CPE and follow up:   She had a hysterectomy for benign causes. She is not currently sexually active because husband has problems. Due for mammogram. No breast lumps, no nipple discharge. She is willing to have colonoscopy now and bone density, but refuses immunizations.   Hyperlipidemia: mild elevation LDL, was monitored yearly by Dr. Manuella Ghazi, on diet only, never statin therapy but would like level to be rechecked. Discussed EKG but she wants to check coverage with insurance. No chest pain or palpitation   Obesity: she started having weight problems after birth of her children, she states her weight fluctuates, not using silver sneakers at this time, states too busy. She eats out, when home usually eats cereal and sandwiches. Discussed healthy diet.   DDD: she states no pain at this time, but states sees Dr. Joyce Copa, chiropractor monthly for adjustments She states when pain is present, causes difficulty bending over, but never had radiculitis. No bowel or bladder incontinence, uses TENS unit prn    Patient Active Problem List   Diagnosis Date Noted  . Obesity (BMI 30.0-34.9) 12/07/2017  . Degenerative joint disease of low back 12/07/2017  . Hyperlipidemia 12/21/2014    Past Surgical History:  Procedure Laterality Date  . ABDOMINAL HYSTERECTOMY  1980  . CATARACT EXTRACTION W/PHACO Right 06/24/2017   Procedure: CATARACT EXTRACTION PHACO AND INTRAOCULAR LENS PLACEMENT (IOC);  Surgeon: Birder Robson, MD;  Location: [facility] ORS;  Service: Ophthalmology;  Laterality: Right;  [place] 00:48.0 AP% 16.5 CDE 7.92 Fluid Pack Lot # [id number]  . CATARACT EXTRACTION W/PHACO Left 07/14/2017   Procedure: CATARACT EXTRACTION PHACO  AND INTRAOCULAR LENS PLACEMENT (IOC);  Surgeon: Birder Robson, MD;  Location: [facility] ORS;  Service: Ophthalmology;  Laterality: Left;  [place] 00:36.0 AP% 16.9 CDE 6.08 Fluid Pack Lot # [id number] [place]  . CESAREAN SECTION  1971  . CHOLECYSTECTOMY  1972  . retal fistula repair     Dr. Jamal Collin     Family History  Problem Relation Age of Onset  . Diabetes Mother   . Hypertension Mother   . Heart failure Father   . Heart disease Father   . Diabetes Brother   . Hypertension Brother     Social History   Socioeconomic History  . Marital status: Married    Spouse name: Percell Miller  . Number of children: 2  . Years of education: Not on file  . Highest education level: Some college, no degree  Occupational History  . Occupation: retired    Comment: since 04/2008  Social Needs  . Financial resource strain: Somewhat hard  . Food insecurity:    Worry: Never true    Inability: Never true  . Transportation needs:    Medical: No    Non-medical: No  Tobacco Use  . Smoking status: Former Smoker    Packs/day: 0.50    Years: 5.00    Pack years: 2.50    Types: Cigarettes    Last attempt to quit: 12/07/1988    Years since quitting: 29.0  . Smokeless tobacco: Never Used  Substance and Sexual Activity  . Alcohol use: No  Alcohol/week: 0.0 standard drinks  . Drug use: No  . Sexual activity: Not Currently    Partners: Male    Birth control/protection: None    Comment: patient's husband has prostate issues so she cannot with him. has not in 21yrs.  Lifestyle  . Physical activity:    Days per week: 0 days    Minutes per session: 0 min  . Stress: To some extent  Relationships  . Social connections:    Talks on phone: More than three times a week    Gets together: More than three times a week    Attends religious service: More than 4 times per year    Active member of club or organization: No    Attends meetings of clubs or organizations: Never    Relationship status:  Married  . Intimate partner violence:    Fear of current or ex partner: No    Emotionally abused: No    Physically abused: No    Forced sexual activity: No  Other Topics Concern  . Not on file  Social History Narrative   Mother is 24yrs old that she helps with       Husband has prostate issues      Patient has 7 deteriorated disc     Current Outpatient Medications:  .  Black Cohosh 540 MG CAPS, Take 1,620 mg by mouth at bedtime. , Disp: , Rfl:  .  Cholecalciferol (VITAMIN D3) 5000 UNITS CAPS, Take 5,000 Units by mouth every other day. , Disp: , Rfl:  .  OVER THE COUNTER MEDICATION, , Disp: , Rfl:  .  Red Yeast Rice 600 MG CAPS, Take 1,200-1,800 mg by mouth See admin instructions. Take 1800 mg by mouth in the morning and take 1200 mg by mouth at bedtime, Disp: , Rfl:   Allergies  Allergen Reactions  . Moxifloxacin Hcl In Nacl Swelling  . Prednisone Other (See Comments)    Paranoid  . Penicillins Rash and Other (See Comments)    Has patient had a PCN reaction causing immediate rash, facial/tongue/throat swelling, SOB or lightheadedness with hypotension: Unknown Has patient had a PCN reaction causing severe rash involving mucus membranes or skin necrosis: No Has patient had a PCN reaction that required hospitalization: Yes Has patient had a PCN reaction occurring within the last 10 years: No If all of the above answers are "NO", then may proceed with Cephalosporin use.      ROS  Constitutional: Negative for fever or weight change.  Respiratory: Negative for cough and shortness of breath.   Cardiovascular: Negative for chest pain or palpitations.  Gastrointestinal: Negative for abdominal pain, no bowel changes.  Musculoskeletal: Negative for gait problem or joint swelling.  Skin: Negative for rash.  Neurological: Negative for dizziness or headache.  No other specific complaints in a complete review of systems (except as listed in HPI above).  Objective  Vitals:    12/07/17 0910  BP: 112/68  Pulse: 71  Resp: 16  Temp: 98.5 F (36.9 C)  TempSrc: Oral  SpO2: 98%  Weight: 158 lb 11.2 oz (72 kg)  Height: 5\' 1"  (1.549 m)    Body mass index is 29.99 kg/m.  Physical Exam   Constitutional: Patient appears well-developed and well-nourished. No distress.  HENT: Head: Normocephalic and atraumatic. Ears: B TMs ok, no erythema or effusion; Nose: Nose normal. Mouth/Throat: Oropharynx is clear and moist. No oropharyngeal exudate.  Eyes: Conjunctivae and EOM are normal. Pupils are equal, round, and reactive to  light. No scleral icterus.  Neck: Normal range of motion. Neck supple. No JVD present. No thyromegaly present.  Cardiovascular: Normal rate, regular rhythm and normal heart sounds.  No murmur heard. No BLE edema. Pulmonary/Chest: Effort normal and breath sounds normal. No respiratory distress. Abdominal: Soft. Bowel sounds are normal, no distension. There is no tenderness. no masses Breast: no lumps or masses, no nipple discharge or rashes FEMALE GENITALIA:  refused RECTAL: not done  Musculoskeletal: decrease rom of lumbar spine, pain with rom.  Neurological: he is alert and oriented to person, place, and time. No cranial nerve deficit. Coordination, balance, strength, speech and gait are normal.  Skin: Skin is warm and dry. No rash noted. No erythema. purpura on both legs she also states usually on arms  Psychiatric: Patient has a normal mood and affect. behavior is normal. Judgment and thought content normal.  PHQ2/9: Depression screen [facility] 2/9 12/07/2017 12/03/2016 12/13/2015 12/21/2014  Decreased Interest 0 0 0 0  Down, Depressed, Hopeless 0 0 0 0  PHQ - 2 Score 0 0 0 0  Altered sleeping 0 - - -  Tired, decreased energy 2 - - -  Change in appetite 0 - - -  Feeling bad or failure about yourself  0 - - -  Trouble concentrating 0 - - -  Moving slowly or fidgety/restless 0 - - -  Suicidal thoughts 0 - - -  PHQ-9 Score 2 - - -  Difficult doing  work/chores Not difficult at all - - -     Fall Risk: Fall Risk  12/07/2017 12/03/2016 12/13/2015 12/21/2014  Falls in the past year? No No No No     Functional Status Survey: Is the patient deaf or have difficulty hearing?: No Does the patient have difficulty seeing, even when wearing glasses/contacts?: No Does the patient have difficulty concentrating, remembering, or making decisions?: No Does the patient have difficulty walking or climbing stairs?: Yes Does the patient have difficulty dressing or bathing?: No Does the patient have difficulty doing errands alone such as visiting a doctor's office or shopping?: No    Assessment & Plan   1. Well woman exam  Discussed importance of 150 minutes of physical activity weekly, eat two servings of fish weekly, eat one serving of tree nuts ( cashews, pistachios, pecans, almonds.Marland Kitchen) every other day, eat 6 servings of fruit/vegetables daily and drink plenty of water and avoid sweet beverages.   2. Breast cancer screening by mammogram  - MM DIGITAL SCREENING BILATERAL; Future  3. Needs flu shot  Refused   4. Need for vaccination for pneumococcus  refused  5. Need for Tdap vaccination  refused  6. Colon cancer screening  - Ambulatory referral to Gastroenterology  7. Pure hypercholesterolemia  - Lipid panel  8. Ovarian failure  - DG Bone Density; Future  9. Obesity (BMI 30.0-34.9)  Discussed with the patient the risk posed by an increased BMI. Discussed importance of portion control, calorie counting and at least 150 minutes of physical activity weekly. Avoid sweet beverages and drink more water. Eat at least 6 servings of fruit and vegetables daily   10. Degenerative joint disease of low back  She states sees Dr. Joyce Copa monthly for adjustments and is doing well   11. Senile purpura ([place])  Discussed  very common for her age group , reassurance.

## 2017-12-08 ENCOUNTER — Ambulatory Visit (INDEPENDENT_AMBULATORY_CARE_PROVIDER_SITE_OTHER): Payer: PPO

## 2017-12-08 VITALS — BP 110/70 | HR 70 | Temp 98.6°F | Resp 12 | Ht 60.0 in | Wt 159.3 lb

## 2017-12-08 DIAGNOSIS — Z0000 Encounter for general adult medical examination without abnormal findings: Secondary | ICD-10-CM | POA: Diagnosis not present

## 2017-12-08 DIAGNOSIS — Z1239 Encounter for other screening for malignant neoplasm of breast: Secondary | ICD-10-CM

## 2017-12-08 DIAGNOSIS — Z1231 Encounter for screening mammogram for malignant neoplasm of breast: Secondary | ICD-10-CM | POA: Diagnosis not present

## 2017-12-08 NOTE — Progress Notes (Signed)
Subjective:   Abigail Hatfield is a 72 y.o. female who presents for Medicare Annual (Subsequent) preventive examination.  Review of Systems:  N/A Cardiac Risk Factors include: advanced age (>90men, >62 women);dyslipidemia;sedentary lifestyle;obesity (BMI >30kg/m2)     Objective:     Vitals: BP 110/70 (BP Location: Left Arm, Patient Position: Sitting, Cuff Size: Normal)   Pulse 70   Temp 98.6 F (37 C) (Oral)   Resp 12   Ht 5' (1.524 m)   Wt 159 lb 4.8 oz (72.3 kg)   SpO2 95%   BMI 31.11 kg/m   Body mass index is 31.11 kg/m.  Advanced Directives 12/08/2017 07/14/2017 06/24/2017 12/03/2016 12/13/2015 12/21/2014  Does Patient Have a Medical Advance Directive? Yes Yes Yes Yes Yes No  Type of Paramedic of [place];Living will [place];Living will [place];Living will - [place] -  Does patient want to make changes to medical advance directive? - No - Patient declined No - Patient declined - No - Patient declined -  Copy of [place] in Chart? No - copy requested No - copy requested No - copy requested - No - copy requested -  Would patient like information on creating a medical advance directive? - - - - - No - patient declined information    Tobacco Social History   Tobacco Use  Smoking Status Former Smoker  . Packs/day: 0.50  . Years: 5.00  . Pack years: 2.50  . Types: Cigarettes  . Last attempt to quit: 12/07/1988  . Years since quitting: 29.0  Smokeless Tobacco Never Used  Tobacco Comment   smoking cessation materials not required     Counseling given: No Comment: smoking cessation materials not required  Clinical Intake:  Pre-visit preparation completed: Yes  Pain : No/denies pain   BMI - recorded: 31.11 Nutritional Status: BMI > 30  Obese Nutritional Risks: None Diabetes: No  How often do you need to have someone help you when you read instructions,  pamphlets, or other written materials from your doctor or pharmacy?: 1 - Never  Interpreter Needed?: No  Information entered by :: AEversole, LPN  Past Medical History:  Diagnosis Date  . Allergy   . Cataract   . Hyperlipidemia   . Medical history non-contributory    Past Surgical History:  Procedure Laterality Date  . ABDOMINAL HYSTERECTOMY  1980  . CATARACT EXTRACTION W/PHACO Right 06/24/2017   Procedure: CATARACT EXTRACTION PHACO AND INTRAOCULAR LENS PLACEMENT (IOC);  Surgeon: Birder Robson, MD;  Location: [facility] ORS;  Service: Ophthalmology;  Laterality: Right;  [place] 00:48.0 AP% 16.5 CDE 7.92 Fluid Pack Lot # [id number]  . CATARACT EXTRACTION W/PHACO Left 07/14/2017   Procedure: CATARACT EXTRACTION PHACO AND INTRAOCULAR LENS PLACEMENT (IOC);  Surgeon: Birder Robson, MD;  Location: [facility] ORS;  Service: Ophthalmology;  Laterality: Left;  [place] 00:36.0 AP% 16.9 CDE 6.08 Fluid Pack Lot # [id number] [place]  . CESAREAN SECTION  1971  . CHOLECYSTECTOMY  1972  . retal fistula repair     Dr. Jamal Collin    Family History  Problem Relation Age of Onset  . Diabetes Mother   . Hypertension Mother   . Heart attack Mother   . Heart failure Father   . Heart disease Father   . Diabetes Brother   . Hypertension Brother   . COPD Brother   . Diabetes Sister    Social History   Socioeconomic History  .  Marital status: Married    Spouse name: Percell Miller  . Number of children: 2  . Years of education: some college  . Highest education level: 12th grade  Occupational History  . Occupation: Retired    Comment: since 04/2008  Social Needs  . Financial resource strain: Not hard at all  . Food insecurity:    Worry: Never true    Inability: Never true  . Transportation needs:    Medical: No    Non-medical: No  Tobacco Use  . Smoking status: Former Smoker    Packs/day: 0.50    Years: 5.00    Pack years: 2.50    Types: Cigarettes    Last attempt to quit: 12/07/1988     Years since quitting: 29.0  . Smokeless tobacco: Never Used  . Tobacco comment: smoking cessation materials not required  Substance and Sexual Activity  . Alcohol use: No    Alcohol/week: 0.0 standard drinks  . Drug use: No  . Sexual activity: Not Currently    Partners: Male    Birth control/protection: None    Comment: patient's husband has prostate issues so she cannot with him. has not in 75yrs.  Lifestyle  . Physical activity:    Days per week: 0 days    Minutes per session: 0 min  . Stress: Not at all  Relationships  . Social connections:    Talks on phone: Patient refused    Gets together: Patient refused    Attends religious service: Patient refused    Active member of club or organization: Patient refused    Attends meetings of clubs or organizations: Patient refused    Relationship status: Married  Other Topics Concern  . Not on file  Social History Narrative   Mother is 73yrs old that she helps with       Husband has prostate issues      Patient has 7 deteriorated disc    Outpatient Encounter Medications as of 12/08/2017  Medication Sig  . Black Cohosh 540 MG CAPS Take 1,620 mg by mouth at bedtime.   . Cholecalciferol (VITAMIN D3) 5000 UNITS CAPS Take 5,000 Units by mouth every other day.   Marland Kitchen OVER THE COUNTER MEDICATION   . Red Yeast Rice 600 MG CAPS Take 1,200-1,800 mg by mouth See admin instructions. Take 1800 mg by mouth in the morning and take 1200 mg by mouth at bedtime   No facility-administered encounter medications on file as of 12/08/2017.     Activities of Daily Living In your present state of health, do you have any difficulty performing the following activities: 12/08/2017 12/07/2017  Hearing? N N  Comment denies hearing aids -  Vision? N N  Comment wears eyeglasses -  Difficulty concentrating or making decisions? N N  Walking or climbing stairs? N Y  Dressing or bathing? N N  Doing errands, shopping? N N  Preparing Food and eating ? N -    Comment denies dentures -  Using the Toilet? N -  In the past six months, have you accidently leaked urine? N -  Do you have problems with loss of bowel control? N -  Managing your Medications? N -  Managing your Finances? N -  Housekeeping or managing your Housekeeping? N -  Some recent data might be hidden    Patient Care Team: Steele Sizer, MD as PCP - General (Family Medicine) Birder Robson, MD as Consulting Physician (Ophthalmology) Ralene Bathe, MD as Consulting Physician (Dermatology)  Assessment:   This is a routine wellness examination for [employer].  Exercise Activities and Dietary recommendations Current Exercise Habits: The patient does not participate in regular exercise at present, Exercise limited by: None identified  Goals    . DIET - INCREASE WATER INTAKE     Recommend to drink at least 6-8 8oz glasses of water per day.       Fall Risk Fall Risk  12/08/2017 12/07/2017 12/03/2016 12/13/2015 12/21/2014  Falls in the past year? No No No No No  Risk for fall due to : Impaired vision - - - -  Risk for fall due to: Comment wears eyeglasses - - - -   FALL RISK PREVENTION PERTAINING TO HOME: Is your home free of loose throw rugs in walkways, pet beds, electrical cords, etc? Yes Is there adequate lighting in your home to reduce risk of falls?  Yes Are there stairs in or around your home WITH handrails? Yes  ASSISTIVE DEVICES UTILIZED TO PREVENT FALLS: Use of a cane, [place] or w/c? No Grab bars in the bathroom? No  Shower chair or a place to sit while bathing? Yes An elevated toilet seat or a handicapped toilet? No  Timed Get Up and Go Performed: Yes. Pt ambulated 10 feet within 8 sec. Gait stead-fast and without the use of an assistive device. No intervention required at this time. Fall risk prevention has been discussed.  Community Resource Referral:  [employer] Referral not required at this time.   OR Community Resource Referral sent to  Care Guide for installation of grab bars in the shower, shower chair or an elevated toilet seat.   OR Pt declined my offer to send Community Resource Referral to Care Guide for installation of grab bars in the shower or an elevated toilet seat.  Depression Screen PHQ 2/9 Scores 12/08/2017 12/07/2017 12/03/2016 12/13/2015  PHQ - 2 Score 0 0 0 0  PHQ- 9 Score 0 2 - -     Cognitive Function     6CIT Screen 12/08/2017  What Year? 0 points  What month? 0 points  What time? 0 points  Count back from 20 0 points  Months in reverse 0 points  Repeat phrase 0 points  Total Score 0    There is no immunization history for the selected administration types on file for this patient.  Qualifies for Shingles Vaccine? Yes. Due for Shingrix. Education has been provided regarding the importance of this vaccine. Pt has been advised to call insurance company to determine out of pocket expense. Advised may also receive vaccine at local pharmacy or Health Dept. Verbalized acceptance and understanding.  Overdue for Flu vaccine. Education has been provided regarding the importance of this vaccine and advised to receive when available. Verbalized acceptance and understanding.  Due for Pneumoccocal vaccine. Declined my offer to administer today. Education has been provided regarding the importance of this vaccine but still declined. Advised may receive this vaccine at local pharmacy or Health Dept. Aware to provide a copy of the vaccination record if obtained from local pharmacy or Health Dept. Verbalized acceptance and understanding.  Due for Tdap vaccine. Education has been provided regarding the importance of this vaccine. Advised may receive this vaccine at local pharmacy or Health Dept. Aware to provide a copy of the vaccination record if obtained from local pharmacy or Health Dept. Verbalized acceptance and understanding.  Screening Tests Health Maintenance  Topic Date Due  . COLONOSCOPY  12/03/1994  .  DEXA SCAN  12/02/2009  . INFLUENZA VACCINE  07/27/2018 (Originally 11/12/2017)  . TETANUS/TDAP  12/08/2018 (Originally 12/03/1963)  . PNA vac Low Risk Adult (1 of 2 - PCV13) 12/08/2018 (Originally 12/02/2009)  . MAMMOGRAM  01/07/2018  . Hepatitis C Screening  Completed    Cancer Screenings: Lung: Low Dose CT Chest recommended if Age 67-80 years, 30 pack-year currently smoking OR have quit w/in 15years. Patient does not qualify. Breast:  Up to date on Mammogram? Yes. Completed 01/07/17. Repeat every year. Pt scheduled 01/11/18  Up to date of Bone Density/Dexa? No. Pt scheduled 01/11/18 Colorectal: Pt scheduled to see Dr. Vicente Males 12/24/17  Additional Screenings: Hepatitis C Screening: Completed 11/26/16    Plan:  I have personally reviewed and addressed the Medicare Annual Wellness questionnaire and have noted the following in the patient's chart:  A. Medical and social history B. Use of alcohol, tobacco or illicit drugs  C. Current medications and supplements D. Functional ability and status E.  Nutritional status F.  Physical activity G. Advance directives H. List of other physicians I.  Hospitalizations, surgeries, and ER visits in previous 12 months J.  [place] such as hearing and vision if needed, cognitive and depression L. Referrals and appointments  In addition, I have reviewed and discussed with patient certain preventive protocols, quality metrics, and best practice recommendations. A written personalized care plan for preventive services as well as general preventive health recommendations were provided to patient.  See attached scanned questionnaire for additional information.   Signed,  Aleatha Borer, LPN Nurse Health Advisor

## 2017-12-08 NOTE — Addendum Note (Signed)
Addended by: Inda Coke on: 12/08/2017 02:36 PM   Modules accepted: Orders

## 2017-12-08 NOTE — Patient Instructions (Signed)
Abigail Hatfield , Thank you for taking time to come for your Medicare Wellness Visit. I appreciate your ongoing commitment to your health goals. Please review the following plan we discussed and let me know if I can assist you in the future.   Screening recommendations/referrals: Colorectal Screening: Please keep your appointment as scheduled with Dr. Vicente Males Mammogram: Please keep your appt as scheduled Bone Density: Please keep your appt as scheduled  Vision and Dental Exams: Recommended annual ophthalmology exams for early detection of glaucoma and other disorders of the eye Recommended annual dental exams for proper oral hygiene  Vaccinations: Influenza vaccine: Overdue Pneumococcal vaccine: Declined Tdap vaccine: Declined. Please call your insurance company to determine your out of pocket expense. You may also receive this vaccine at your local pharmacy or Health Dept. Shingles vaccine: Please call your insurance company to determine your out of pocket expense for the Shingrix vaccine. You may also receive this vaccine at your local pharmacy or Health Dept.    Advanced directives: Please bring a copy of your POA (Power of Attorney) and/or Living Will to your next appointment.  Goals: Recommend to drink at least 6-8 8oz glasses of water per day.  Next appointment: Please schedule your Annual Wellness Visit with your Nurse Health Advisor in one year.  Preventive Care 73 Years and Older, Female Preventive care refers to lifestyle choices and visits with your health care provider that can promote health and wellness. What does preventive care include?  A yearly physical exam. This is also called an annual well check.  Dental exams once or twice a year.  Routine eye exams. Ask your health care provider how often you should have your eyes checked.  Personal lifestyle choices, including:  Daily care of your teeth and gums.  Regular physical activity.  Eating a healthy diet.  Avoiding  tobacco and drug use.  Limiting alcohol use.  Practicing safe sex.  Taking low-dose aspirin every day.  Taking vitamin and mineral supplements as recommended by your health care provider. What happens during an annual well check? The services and screenings done by your health care provider during your annual well check will depend on your age, overall health, lifestyle risk factors, and family history of disease. Counseling  Your health care provider may ask you questions about your:  Alcohol use.  Tobacco use.  Drug use.  Emotional well-being.  Home and relationship well-being.  Sexual activity.  Eating habits.  History of falls.  Memory and ability to understand (cognition).  Work and work Statistician.  Reproductive health. Screening  You may have the following tests or measurements:  Height, weight, and BMI.  Blood pressure.  Lipid and cholesterol levels. These may be checked every 5 years, or more frequently if you are over 29 years old.  Skin check.  Lung cancer screening. You may have this screening every year starting at age 42 if you have a 30-pack-year history of smoking and currently smoke or have quit within the past 15 years.  Fecal occult blood test (FOBT) of the stool. You may have this test every year starting at age 5.  Flexible sigmoidoscopy or colonoscopy. You may have a sigmoidoscopy every 5 years or a colonoscopy every 10 years starting at age 60.  Hepatitis C blood test.  Hepatitis B blood test.  Sexually transmitted disease (STD) testing.  Diabetes screening. This is done by checking your blood sugar (glucose) after you have not eaten for a while (fasting). You may have this done  every 1-3 years.  Bone density scan. This is done to screen for osteoporosis. You may have this done starting at age 51.  Mammogram. This may be done every 1-2 years. Talk to your health care provider about how often you should have regular  mammograms. Talk with your health care provider about your test results, treatment options, and if necessary, the need for more tests. Vaccines  Your health care provider may recommend certain vaccines, such as:  Influenza vaccine. This is recommended every year.  Tetanus, diphtheria, and acellular pertussis (Tdap, Td) vaccine. You may need a Td booster every 10 years.  Zoster vaccine. You may need this after age 5.  Pneumococcal 13-valent conjugate (PCV13) vaccine. One dose is recommended after age 41.  Pneumococcal polysaccharide (PPSV23) vaccine. One dose is recommended after age 75. Talk to your health care provider about which screenings and vaccines you need and how often you need them. This information is not intended to replace advice given to you by your health care provider. Make sure you discuss any questions you have with your health care provider. Document Released: 04/27/2015 Document Revised: 12/19/2015 Document Reviewed: 01/30/2015 Elsevier Interactive Patient Education  2017 [place] Prevention in the Home Falls can cause injuries. They can happen to people of all ages. There are many things you can do to make your home safe and to help prevent falls. What can I do on the outside of my home?  Regularly fix the edges of walkways and driveways and fix any cracks.  Remove anything that might make you trip as you walk through a door, such as a raised step or threshold.  Trim any bushes or trees on the path to your home.  Use bright outdoor lighting.  Clear any walking paths of anything that might make someone trip, such as rocks or tools.  Regularly check to see if handrails are loose or broken. Make sure that both sides of any steps have handrails.  Any raised decks and porches should have guardrails on the edges.  Have any leaves, snow, or ice cleared regularly.  Use sand or salt on walking paths during winter.  Clean up any spills in your garage  right away. This includes oil or grease spills. What can I do in the bathroom?  Use night lights.  Install grab bars by the toilet and in the tub and shower. Do not use towel bars as grab bars.  Use non-skid mats or decals in the tub or shower.  If you need to sit down in the shower, use a plastic, non-slip stool.  Keep the floor dry. Clean up any water that spills on the floor as soon as it happens.  Remove soap buildup in the tub or shower regularly.  Attach bath mats securely with double-sided non-slip rug tape.  Do not have throw rugs and other things on the floor that can make you trip. What can I do in the bedroom?  Use night lights.  Make sure that you have a light by your bed that is easy to reach.  Do not use any sheets or blankets that are too big for your bed. They should not hang down onto the floor.  Have a firm chair that has side arms. You can use this for support while you get dressed.  Do not have throw rugs and other things on the floor that can make you trip. What can I do in the kitchen?  Clean up any spills right  away.  Avoid walking on wet floors.  Keep items that you use a lot in easy-to-reach places.  If you need to reach something above you, use a strong step stool that has a grab bar.  Keep electrical cords out of the way.  Do not use floor polish or wax that makes floors slippery. If you must use wax, use non-skid floor wax.  Do not have throw rugs and other things on the floor that can make you trip. What can I do with my stairs?  Do not leave any items on the stairs.  Make sure that there are handrails on both sides of the stairs and use them. Fix handrails that are broken or loose. Make sure that handrails are as long as the stairways.  Check any carpeting to make sure that it is firmly attached to the stairs. Fix any carpet that is loose or worn.  Avoid having throw rugs at the top or bottom of the stairs. If you do have throw rugs,  attach them to the floor with carpet tape.  Make sure that you have a light switch at the top of the stairs and the bottom of the stairs. If you do not have them, ask someone to add them for you. What else can I do to help prevent falls?  Wear shoes that:  Do not have high heels.  Have rubber bottoms.  Are comfortable and fit you well.  Are closed at the toe. Do not wear sandals.  If you use a stepladder:  Make sure that it is fully opened. Do not climb a closed stepladder.  Make sure that both sides of the stepladder are locked into place.  Ask someone to hold it for you, if possible.  Clearly mark and make sure that you can see:  Any grab bars or handrails.  First and last steps.  Where the edge of each step is.  Use tools that help you move around (mobility aids) if they are needed. These include:  Canes.  Walkers.  Scooters.  Crutches.  Turn on the lights when you go into a dark area. Replace any light bulbs as soon as they burn out.  Set up your furniture so you have a clear path. Avoid moving your furniture around.  If any of your floors are uneven, fix them.  If there are any pets around you, be aware of where they are.  Review your medicines with your doctor. Some medicines can make you feel dizzy. This can increase your chance of falling. Ask your doctor what other things that you can do to help prevent falls. This information is not intended to replace advice given to you by your health care provider. Make sure you discuss any questions you have with your health care provider. Document Released: 01/25/2009 Document Revised: 09/06/2015 Document Reviewed: 05/05/2014 Elsevier Interactive Patient Education  2017 [employer].

## 2017-12-09 DIAGNOSIS — M9903 Segmental and somatic dysfunction of lumbar region: Secondary | ICD-10-CM | POA: Diagnosis not present

## 2017-12-09 DIAGNOSIS — M62830 Muscle spasm of back: Secondary | ICD-10-CM | POA: Diagnosis not present

## 2017-12-09 DIAGNOSIS — M5033 Other cervical disc degeneration, cervicothoracic region: Secondary | ICD-10-CM | POA: Diagnosis not present

## 2017-12-09 DIAGNOSIS — M9901 Segmental and somatic dysfunction of cervical region: Secondary | ICD-10-CM | POA: Diagnosis not present

## 2017-12-15 ENCOUNTER — Telehealth: Payer: Self-pay | Admitting: Gastroenterology

## 2017-12-15 NOTE — Telephone Encounter (Signed)
Patient states suprep is $90 at the pharmacy but she is requesting a call from the nurse to discuss other options. Patient scheduled for colon on 9.12.19.

## 2017-12-16 NOTE — Telephone Encounter (Signed)
Patient calling back stating she did not receive a call yesterday. Patient wanting to discuss prep options with a nurse before procedure on 9.12.19.

## 2017-12-16 NOTE — Telephone Encounter (Signed)
Returned patients call to discuss bowel prep options.  Plenvu is more expensive than Suprep. I will provide patient with a sample of Su Prep.  She will pick up today.  Thanks [employer]

## 2017-12-21 ENCOUNTER — Telehealth: Payer: Self-pay | Admitting: Gastroenterology

## 2017-12-21 NOTE — Telephone Encounter (Signed)
Pt left vm for Sharyn Lull she has some questions

## 2017-12-23 ENCOUNTER — Encounter: Payer: Self-pay | Admitting: *Deleted

## 2017-12-24 ENCOUNTER — Ambulatory Visit
Admission: RE | Admit: 2017-12-24 | Discharge: 2017-12-24 | Disposition: A | Discharge status: Home / Self Care | Payer: PPO | Source: Ambulatory Visit | Attending: Gastroenterology | Admitting: Gastroenterology

## 2017-12-24 ENCOUNTER — Encounter
Admission: RE | Disposition: A | Discharge status: Home / Self Care | Payer: Self-pay | Source: Ambulatory Visit | Attending: Gastroenterology

## 2017-12-24 ENCOUNTER — Other Ambulatory Visit: Payer: Self-pay

## 2017-12-24 ENCOUNTER — Ambulatory Visit: Payer: PPO | Admitting: Anesthesiology

## 2017-12-24 DIAGNOSIS — E785 Hyperlipidemia, unspecified: Secondary | ICD-10-CM | POA: Insufficient documentation

## 2017-12-24 DIAGNOSIS — I739 Peripheral vascular disease, unspecified: Secondary | ICD-10-CM | POA: Insufficient documentation

## 2017-12-24 DIAGNOSIS — Z1211 Encounter for screening for malignant neoplasm of colon: Secondary | ICD-10-CM | POA: Diagnosis not present

## 2017-12-24 DIAGNOSIS — D125 Benign neoplasm of sigmoid colon: Secondary | ICD-10-CM | POA: Diagnosis not present

## 2017-12-24 DIAGNOSIS — Z8249 Family history of ischemic heart disease and other diseases of the circulatory system: Secondary | ICD-10-CM | POA: Diagnosis not present

## 2017-12-24 DIAGNOSIS — Z79899 Other long term (current) drug therapy: Secondary | ICD-10-CM | POA: Diagnosis not present

## 2017-12-24 DIAGNOSIS — Z880 Allergy status to penicillin: Secondary | ICD-10-CM | POA: Insufficient documentation

## 2017-12-24 DIAGNOSIS — Z825 Family history of asthma and other chronic lower respiratory diseases: Secondary | ICD-10-CM | POA: Insufficient documentation

## 2017-12-24 DIAGNOSIS — D120 Benign neoplasm of cecum: Secondary | ICD-10-CM | POA: Diagnosis not present

## 2017-12-24 DIAGNOSIS — Z833 Family history of diabetes mellitus: Secondary | ICD-10-CM | POA: Diagnosis not present

## 2017-12-24 DIAGNOSIS — M1990 Unspecified osteoarthritis, unspecified site: Secondary | ICD-10-CM | POA: Diagnosis not present

## 2017-12-24 DIAGNOSIS — K5730 Diverticulosis of large intestine without perforation or abscess without bleeding: Secondary | ICD-10-CM | POA: Diagnosis not present

## 2017-12-24 DIAGNOSIS — Z9841 Cataract extraction status, right eye: Secondary | ICD-10-CM | POA: Insufficient documentation

## 2017-12-24 DIAGNOSIS — Z9049 Acquired absence of other specified parts of digestive tract: Secondary | ICD-10-CM | POA: Diagnosis not present

## 2017-12-24 DIAGNOSIS — Z90710 Acquired absence of both cervix and uterus: Secondary | ICD-10-CM | POA: Insufficient documentation

## 2017-12-24 DIAGNOSIS — K5790 Diverticulosis of intestine, part unspecified, without perforation or abscess without bleeding: Secondary | ICD-10-CM | POA: Diagnosis not present

## 2017-12-24 DIAGNOSIS — Z87891 Personal history of nicotine dependence: Secondary | ICD-10-CM | POA: Insufficient documentation

## 2017-12-24 DIAGNOSIS — Z9842 Cataract extraction status, left eye: Secondary | ICD-10-CM | POA: Insufficient documentation

## 2017-12-24 DIAGNOSIS — Z888 Allergy status to other drugs, medicaments and biological substances status: Secondary | ICD-10-CM | POA: Insufficient documentation

## 2017-12-24 DIAGNOSIS — K635 Polyp of colon: Secondary | ICD-10-CM | POA: Diagnosis not present

## 2017-12-24 HISTORY — PX: COLONOSCOPY WITH PROPOFOL: SHX5780

## 2017-12-24 SURGERY — COLONOSCOPY WITH PROPOFOL
Anesthesia: General

## 2017-12-24 MED ORDER — SODIUM CHLORIDE 0.9 % IV SOLN
INTRAVENOUS | Status: DC
  Administered 2017-12-24 (×2): via INTRAVENOUS

## 2017-12-24 MED ORDER — PROPOFOL 500 MG/50ML IV EMUL
INTRAVENOUS | Status: AC
  Filled 2017-12-24: qty 50

## 2017-12-24 MED ORDER — PROPOFOL 500 MG/50ML IV EMUL
INTRAVENOUS | Status: DC | PRN
  Administered 2017-12-24: 100 ug/kg/min via INTRAVENOUS

## 2017-12-24 MED ORDER — PROPOFOL 10 MG/ML IV BOLUS
INTRAVENOUS | Status: AC
  Filled 2017-12-24: qty 20

## 2017-12-24 MED ORDER — LIDOCAINE HCL (CARDIAC) PF 100 MG/5ML IV SOSY
PREFILLED_SYRINGE | INTRAVENOUS | Status: DC | PRN
  Administered 2017-12-24: 70 mg via INTRAVENOUS

## 2017-12-24 MED ORDER — PROPOFOL 10 MG/ML IV BOLUS
INTRAVENOUS | Status: DC | PRN
  Administered 2017-12-24: 50 ug via INTRAVENOUS

## 2017-12-24 NOTE — Anesthesia Postprocedure Evaluation (Signed)
Anesthesia Post Note  Patient: Abigail Hatfield  Procedure(s) Performed: COLONOSCOPY WITH PROPOFOL (N/A )  Patient location during evaluation: PACU Anesthesia Type: General Level of consciousness: awake and alert Pain management: pain level controlled Vital Signs Assessment: post-procedure vital signs reviewed and stable Respiratory status: spontaneous breathing, nonlabored ventilation, respiratory function stable and patient connected to nasal cannula oxygen Cardiovascular status: blood pressure returned to baseline and stable Postop Assessment: no apparent nausea or vomiting Anesthetic complications: no     Last Vitals:  Vitals:   12/24/17 0853 12/24/17 0954  BP: (!) 115/41   Pulse: 72   Resp: 20   Temp: (!) 35.9 C (!) 36.1 C  SpO2: 100%     Last Pain:  Vitals:   12/24/17 0954  TempSrc: Tympanic  PainSc:                  Durenda Hurt

## 2017-12-24 NOTE — Op Note (Signed)
[facility] Gastroenterology Patient Name: Abigail Hatfield Procedure Date: 12/24/2017 9:27 AM MRN: [id number] Account #: [id number] Date of Birth: [birth year] Admit Type: Outpatient Age: 72 Room: [facility] ENDO ROOM 4 Gender: Female Note Status: Finalized Procedure:            Colonoscopy Indications:          Screening for colorectal malignant neoplasm Providers:            Jonathon Bellows MD, MD Referring MD:         Bethena Roys. Sowles, MD (Referring MD) Medicines:            Monitored Anesthesia Care Complications:        No immediate complications. Procedure:            Pre-Anesthesia Assessment:                       - Prior to the procedure, a History and Physical was                        performed, and patient medications, allergies and                        sensitivities were reviewed. The patient's tolerance of                        previous anesthesia was reviewed.                       - The risks and benefits of the procedure and the                        sedation options and risks were discussed with the                        patient. All questions were answered and informed                        consent was obtained.                       - ASA Grade Assessment: II - A patient with mild                        systemic disease.                       After obtaining informed consent, the colonoscope was                        passed under direct vision. Throughout the procedure,                        the patient's blood pressure, pulse, and oxygen                        saturations were monitored continuously. The                        Colonoscope was introduced through the anus and  advanced to the the cecum, identified by the                        appendiceal orifice, IC valve and transillumination.                        The colonoscopy was performed with ease. The patient                        tolerated the procedure well. The  quality of the bowel                        preparation was good. Findings:      The perianal and digital rectal examinations were normal.      A few small-mouthed diverticula were found in the sigmoid colon.      Two sessile polyps were found in the sigmoid colon and cecum. The polyps       were 3 to 4 mm in size. These polyps were removed with a cold biopsy       forceps. Resection and retrieval were complete.      The exam was otherwise without abnormality on direct and retroflexion       views. Impression:           - Diverticulosis in the sigmoid colon.                       - Two 3 to 4 mm polyps in the sigmoid colon and in the                        cecum, removed with a cold biopsy forceps. Resected and                        retrieved.                       - The examination was otherwise normal on direct and                        retroflexion views. Recommendation:       - Discharge patient to home (with escort).                       - Resume previous diet.                       - Continue present medications.                       - Await pathology results.                       - Repeat colonoscopy in 5-10 years for surveillance                        based on pathology results. Procedure Code(s):    --- Professional ---                       [phone], Colonoscopy, flexible; with biopsy, single or  multiple Diagnosis Code(s):    --- Professional ---                       Z12.11, Encounter for screening for malignant neoplasm                        of colon                       D12.5, Benign neoplasm of sigmoid colon                       D12.0, Benign neoplasm of cecum                       K57.30, Diverticulosis of large intestine without                        perforation or abscess without bleeding CPT copyright 2017 American Medical Association. All rights reserved. The codes documented in this report are preliminary and upon coder review may   be revised to meet current compliance requirements. Jonathon Bellows, MD Jonathon Bellows MD, MD 12/24/2017 9:53:59 AM This report has been signed electronically. Number of Addenda: 0 Note Initiated On: 12/24/2017 9:27 AM Scope Withdrawal Time: 0 hours 12 minutes 45 seconds  Total Procedure Duration: 0 hours 16 minutes 26 seconds       [facility]

## 2017-12-24 NOTE — Anesthesia Post-op Follow-up Note (Signed)
Anesthesia QCDR form completed.        

## 2017-12-24 NOTE — Anesthesia Preprocedure Evaluation (Addendum)
Anesthesia Evaluation  Patient identified by MRN, date of birth, ID band Patient awake    Reviewed: Allergy & Precautions, H&P , NPO status , Patient's Chart, lab work & pertinent test results  Airway Mallampati: I  TM Distance: >3 FB Neck ROM: full    Dental  (+) Chipped, Missing   Pulmonary neg pulmonary ROS, former smoker,    breath sounds clear to auscultation       Cardiovascular + Peripheral Vascular Disease  negative cardio ROS   Rhythm:regular Rate:Normal     Neuro/Psych negative neurological ROS  negative psych ROS   GI/Hepatic negative GI ROS, Neg liver ROS,   Endo/Other  negative endocrine ROS  Renal/GU negative Renal ROS  negative genitourinary   Musculoskeletal  (+) Arthritis ,   Abdominal   Peds  Hematology negative hematology ROS (+)   Anesthesia Other Findings Past Medical History: No date: Allergy No date: Cataract No date: Hyperlipidemia No date: Medical history non-contributory  Past Surgical History: 1980: ABDOMINAL HYSTERECTOMY 06/24/2017: CATARACT EXTRACTION W/PHACO; Right     Comment:  Procedure: CATARACT EXTRACTION PHACO AND INTRAOCULAR               LENS PLACEMENT ([place]);  Surgeon: Birder Robson, MD;                Location: [facility] ORS;  Service: Ophthalmology;  Laterality:              Right;  [place] 00:48.0 AP% 16.5 CDE 7.92 Fluid Pack Lot #               [id number] 07/14/2017: CATARACT EXTRACTION W/PHACO; Left     Comment:  Procedure: CATARACT EXTRACTION PHACO AND INTRAOCULAR               LENS PLACEMENT (I[facility]);  Surgeon: Birder Robson, MD;                Location: [facility] ORS;  Service: Ophthalmology;  Laterality:              Left;  [place] 00:36.0 AP% 16.9 CDE 6.08 Fluid Pack Lot #               [id number] 1967: CESAREAN SECTION 1971: CESAREAN SECTION 1972: CHOLECYSTECTOMY No date: EYE SURGERY No date: retal fistula repair     Comment:  Dr. Jamal Collin       Reproductive/Obstetrics negative OB ROS                            Anesthesia Physical Anesthesia Plan  ASA: II  Anesthesia Plan: General   Post-op Pain Management:    Induction:   PONV Risk Score and Plan: Propofol infusion and TIVA  Airway Management Planned: Natural Airway and Nasal Cannula  Additional Equipment:   Intra-op Plan:   Post-operative Plan:   Informed Consent: I have reviewed the patients History and Physical, chart, labs and discussed the procedure including the risks, benefits and alternatives for the proposed anesthesia with the patient or authorized representative who has indicated his/her understanding and acceptance.   Dental Advisory Given  Plan Discussed with: Anesthesiologist, CRNA and Surgeon  Anesthesia Plan Comments:         Anesthesia Quick Evaluation

## 2017-12-24 NOTE — H&P (Signed)
Jonathon Bellows, MD [address] [place], [place], [place], [address] [address], [place], [place], [place], [address] Phone: [phone]  Fax: [phone]  Primary Care Physician:  Steele Sizer, MD   Pre-Procedure History & Physical: HPI:  Abigail Hatfield is a 72 y.o. female is here for an colonoscopy.   Past Medical History:  Diagnosis Date  . Allergy   . Cataract   . Hyperlipidemia   . Medical history non-contributory     Past Surgical History:  Procedure Laterality Date  . ABDOMINAL HYSTERECTOMY  1980  . CATARACT EXTRACTION W/PHACO Right 06/24/2017   Procedure: CATARACT EXTRACTION PHACO AND INTRAOCULAR LENS PLACEMENT (I[facility]);  Surgeon: Birder Robson, MD;  Location: [facility] ORS;  Service: Ophthalmology;  Laterality: Right;  [place] 00:48.0 AP% 16.5 CDE 7.92 Fluid Pack Lot # [id number]  . CATARACT EXTRACTION W/PHACO Left 07/14/2017   Procedure: CATARACT EXTRACTION PHACO AND INTRAOCULAR LENS PLACEMENT (I[facility]);  Surgeon: Birder Robson, MD;  Location: [facility] ORS;  Service: Ophthalmology;  Laterality: Left;  [place] 00:36.0 AP% 16.9 CDE 6.08 Fluid Pack Lot # [id number] [place]  . CESAREAN SECTION  1971  . CHOLECYSTECTOMY  1972  . EYE SURGERY    . retal fistula repair     Dr. Jamal Collin x 2    Prior to Admission medications   Medication Sig Start Date End Date Taking? Authorizing Provider  Black Cohosh 540 MG CAPS Take 1,620 mg by mouth at bedtime.     [provider]  Cholecalciferol (VITAMIN D3) 5000 UNITS CAPS Take 5,000 Units by mouth every other day.     [provider]  OVER THE COUNTER MEDICATION     [provider]  Red Yeast Rice 600 MG CAPS Take 1,200-1,800 mg by mouth See admin instructions. Take 1800 mg by mouth in the morning and take 1200 mg by mouth at bedtime    [provider]    Allergies as of 12/07/2017 - Review Complete 12/07/2017  Allergen Reaction Noted  . Moxifloxacin hcl in nacl Swelling  12/21/2014  . Prednisone Other (See Comments) 12/21/2014  . Penicillins Rash and Other (See Comments) 12/21/2014    Family History  Problem Relation Age of Onset  . Diabetes Mother   . Hypertension Mother   . Heart attack Mother   . Heart failure Father   . Heart disease Father   . Diabetes Brother   . Hypertension Brother   . COPD Brother   . Diabetes Sister     Social History   Socioeconomic History  . Marital status: Married    Spouse name: Percell Miller  . Number of children: 2  . Years of education: some college  . Highest education level: 12th grade  Occupational History  . Occupation: Retired    Comment: since 04/2008  Social Needs  . Financial resource strain: Not hard at all  . Food insecurity:    Worry: Never true    Inability: Never true  . Transportation needs:    Medical: No    Non-medical: No  Tobacco Use  . Smoking status: Former Smoker    Packs/day: 0.50    Years: 5.00    Pack years: 2.50    Types: Cigarettes    Last attempt to quit: 12/07/1988    Years since quitting: 29.0  . Smokeless tobacco: Never Used  . Tobacco comment: smoking cessation materials not required  Substance and Sexual Activity  .  Alcohol use: No    Alcohol/week: 0.0 standard drinks  . Drug use: No  . Sexual activity: Not Currently    Partners: Male    Birth control/protection: None    Comment: patient's husband has prostate issues so she cannot with him. has not in 56yrs.  Lifestyle  . Physical activity:    Days per week: 0 days    Minutes per session: 0 min  . Stress: Not at all  Relationships  . Social connections:    Talks on phone: Patient refused    Gets together: Patient refused    Attends religious service: Patient refused    Active member of club or organization: Patient refused    Attends meetings of clubs or organizations: Patient refused    Relationship status: Married  . Intimate partner violence:    Fear of current or ex partner: No    Emotionally abused: No     Physically abused: No    Forced sexual activity: No  Other Topics Concern  . Not on file  Social History Narrative   Mother is 78yrs old that she helps with       Husband has prostate issues      Patient has 7 deteriorated disc    Review of Systems: See HPI, otherwise negative ROS  Physical Exam: BP (!) 115/41   Pulse 72   Temp (!) 96.7 F (35.9 C) (Tympanic)   Resp 20   Ht 5' (1.524 m)   Wt 70.3 kg   SpO2 100%   BMI 30.27 kg/m  General:   Alert,  pleasant and cooperative in NAD Head:  Normocephalic and atraumatic. Neck:  Supple; no masses or thyromegaly. Lungs:  Clear throughout to auscultation, normal respiratory effort.    Heart:  +S1, +S2, Regular rate and rhythm, No edema. Abdomen:  Soft, nontender and nondistended. Normal bowel sounds, without guarding, and without rebound.   Neurologic:  Alert and  oriented x4;  grossly normal neurologically.  Impression/Plan: Abigail Hatfield is here for an colonoscopy to be performed for Screening colonoscopy average risk   Risks, benefits, limitations, and alternatives regarding  colonoscopy have been reviewed with the patient.  Questions have been answered.  All parties agreeable.   Jonathon Bellows, MD  12/24/2017, 9:14 AM

## 2017-12-24 NOTE — Transfer of Care (Signed)
Immediate Anesthesia Transfer of Care Note  Patient: Abigail Hatfield  Procedure(s) Performed: COLONOSCOPY WITH PROPOFOL (N/A )  Patient Location: PACU  Anesthesia Type:General  Level of Consciousness: sedated  Airway & Oxygen Therapy: Patient Spontanous Breathing  Post-op Assessment: Report given to RN  Post vital signs: Reviewed and stable  Last Vitals:  Vitals Value Taken Time  BP 103/62 12/24/2017 10:29 AM  Temp    Pulse 61 12/24/2017 10:34 AM  Resp 16 12/24/2017 10:34 AM  SpO2 99 % 12/24/2017 10:34 AM  Vitals shown include unvalidated device data.  Last Pain:  Vitals:   12/24/17 0954  TempSrc: Tympanic  PainSc:          Complications: No apparent anesthesia complications

## 2017-12-25 LAB — SURGICAL PATHOLOGY

## 2017-12-27 ENCOUNTER — Encounter: Payer: Self-pay | Admitting: Gastroenterology

## 2017-12-28 ENCOUNTER — Encounter: Payer: Self-pay | Admitting: Gastroenterology

## 2018-01-06 DIAGNOSIS — M62830 Muscle spasm of back: Secondary | ICD-10-CM | POA: Diagnosis not present

## 2018-01-06 DIAGNOSIS — M9903 Segmental and somatic dysfunction of lumbar region: Secondary | ICD-10-CM | POA: Diagnosis not present

## 2018-01-06 DIAGNOSIS — M5033 Other cervical disc degeneration, cervicothoracic region: Secondary | ICD-10-CM | POA: Diagnosis not present

## 2018-01-06 DIAGNOSIS — M9901 Segmental and somatic dysfunction of cervical region: Secondary | ICD-10-CM | POA: Diagnosis not present

## 2018-01-11 ENCOUNTER — Ambulatory Visit
Admission: RE | Admit: 2018-01-11 | Discharge: 2018-01-11 | Disposition: A | Discharge status: Home / Self Care | Payer: PPO | Source: Ambulatory Visit | Attending: Family Medicine | Admitting: Family Medicine

## 2018-01-11 DIAGNOSIS — Z1231 Encounter for screening mammogram for malignant neoplasm of breast: Secondary | ICD-10-CM | POA: Insufficient documentation

## 2018-01-11 DIAGNOSIS — E2839 Other primary ovarian failure: Secondary | ICD-10-CM

## 2018-01-18 ENCOUNTER — Telehealth: Payer: Self-pay

## 2018-01-18 NOTE — Telephone Encounter (Signed)
That is good, I usually recommend 2000 units daily, but 5000 every other day is about the same

## 2018-01-18 NOTE — Telephone Encounter (Signed)
Patient was informed and said thanks. 

## 2018-01-18 NOTE — Telephone Encounter (Signed)
Patient called stating that she got her Dexa results and it said that she needed to take Vitamin D. She stated that she is taking Vitamin D3 5000mg  every other day and wanted to know if that is enough.  Please advise.

## 2018-02-03 DIAGNOSIS — M9901 Segmental and somatic dysfunction of cervical region: Secondary | ICD-10-CM | POA: Diagnosis not present

## 2018-02-03 DIAGNOSIS — M62830 Muscle spasm of back: Secondary | ICD-10-CM | POA: Diagnosis not present

## 2018-02-03 DIAGNOSIS — M9903 Segmental and somatic dysfunction of lumbar region: Secondary | ICD-10-CM | POA: Diagnosis not present

## 2018-02-03 DIAGNOSIS — M5033 Other cervical disc degeneration, cervicothoracic region: Secondary | ICD-10-CM | POA: Diagnosis not present

## 2018-03-03 DIAGNOSIS — M9903 Segmental and somatic dysfunction of lumbar region: Secondary | ICD-10-CM | POA: Diagnosis not present

## 2018-03-03 DIAGNOSIS — M5033 Other cervical disc degeneration, cervicothoracic region: Secondary | ICD-10-CM | POA: Diagnosis not present

## 2018-03-03 DIAGNOSIS — M9901 Segmental and somatic dysfunction of cervical region: Secondary | ICD-10-CM | POA: Diagnosis not present

## 2018-03-03 DIAGNOSIS — M62830 Muscle spasm of back: Secondary | ICD-10-CM | POA: Diagnosis not present

## 2018-03-31 DIAGNOSIS — M62830 Muscle spasm of back: Secondary | ICD-10-CM | POA: Diagnosis not present

## 2018-03-31 DIAGNOSIS — M9901 Segmental and somatic dysfunction of cervical region: Secondary | ICD-10-CM | POA: Diagnosis not present

## 2018-03-31 DIAGNOSIS — M9903 Segmental and somatic dysfunction of lumbar region: Secondary | ICD-10-CM | POA: Diagnosis not present

## 2018-03-31 DIAGNOSIS — M5033 Other cervical disc degeneration, cervicothoracic region: Secondary | ICD-10-CM | POA: Diagnosis not present

## 2018-05-21 DIAGNOSIS — H43813 Vitreous degeneration, bilateral: Secondary | ICD-10-CM | POA: Diagnosis not present

## 2018-05-26 DIAGNOSIS — M9903 Segmental and somatic dysfunction of lumbar region: Secondary | ICD-10-CM | POA: Diagnosis not present

## 2018-05-26 DIAGNOSIS — M9901 Segmental and somatic dysfunction of cervical region: Secondary | ICD-10-CM | POA: Diagnosis not present

## 2018-05-26 DIAGNOSIS — M5033 Other cervical disc degeneration, cervicothoracic region: Secondary | ICD-10-CM | POA: Diagnosis not present

## 2018-05-26 DIAGNOSIS — M62830 Muscle spasm of back: Secondary | ICD-10-CM | POA: Diagnosis not present

## 2018-06-06 ENCOUNTER — Emergency Department
Admission: EM | Admit: 2018-06-06 | Discharge: 2018-06-07 | Disposition: A | Discharge status: Home / Self Care | Payer: PPO | Attending: Emergency Medicine | Admitting: Emergency Medicine

## 2018-06-06 ENCOUNTER — Encounter: Payer: Self-pay | Admitting: Emergency Medicine

## 2018-06-06 ENCOUNTER — Other Ambulatory Visit: Payer: Self-pay

## 2018-06-06 ENCOUNTER — Emergency Department: Payer: PPO

## 2018-06-06 DIAGNOSIS — G3184 Mild cognitive impairment, so stated: Secondary | ICD-10-CM | POA: Diagnosis not present

## 2018-06-06 DIAGNOSIS — Z87891 Personal history of nicotine dependence: Secondary | ICD-10-CM | POA: Diagnosis not present

## 2018-06-06 DIAGNOSIS — R413 Other amnesia: Secondary | ICD-10-CM | POA: Insufficient documentation

## 2018-06-06 DIAGNOSIS — R410 Disorientation, unspecified: Secondary | ICD-10-CM | POA: Diagnosis not present

## 2018-06-06 DIAGNOSIS — R4182 Altered mental status, unspecified: Secondary | ICD-10-CM | POA: Diagnosis not present

## 2018-06-06 DIAGNOSIS — G9340 Encephalopathy, unspecified: Secondary | ICD-10-CM | POA: Diagnosis not present

## 2018-06-06 LAB — CBC
HEMATOCRIT: 40.6 % (ref 36.0–46.0)
HEMOGLOBIN: 13.6 g/dL (ref 12.0–15.0)
MCH: 31.9 pg (ref 26.0–34.0)
MCHC: 33.5 g/dL (ref 30.0–36.0)
MCV: 95.1 fL (ref 80.0–100.0)
NRBC: 0 % (ref 0.0–0.2)
Platelets: 320 10*3/uL (ref 150–400)
RBC: 4.27 MIL/uL (ref 3.87–5.11)
RDW: 13.4 % (ref 11.5–15.5)
WBC: 7.1 10*3/uL (ref 4.0–10.5)

## 2018-06-06 LAB — BASIC METABOLIC PANEL
ANION GAP: 9 (ref 5–15)
BUN: 17 mg/dL (ref 8–23)
CALCIUM: 9.2 mg/dL (ref 8.9–10.3)
CHLORIDE: 107 mmol/L (ref 98–111)
CO2: 26 mmol/L (ref 22–32)
Creatinine, Ser: 0.82 mg/dL (ref 0.44–1.00)
GFR calc non Af Amer: >60 mL/min (ref >60)
GLUCOSE: 100 mg/dL — AB (ref 70–99)
POTASSIUM: 3.6 mmol/L (ref 3.5–5.1)
Sodium: 142 mmol/L (ref 135–145)

## 2018-06-06 LAB — TROPONIN I: Troponin I: <0.03 ng/mL (ref <0.03)

## 2018-06-06 MED ORDER — LORAZEPAM 1 MG PO TABS
1.0000 mg | ORAL_TABLET | Freq: Once | ORAL | Status: AC
  Administered 2018-06-06: 1 mg via ORAL
  Filled 2018-06-06: qty 1

## 2018-06-06 NOTE — ED Triage Notes (Signed)
First nurse note  After church was over around 11:30 pt starting having memory loss, pt is upset states that she has been under a lot of stress, daughter states that she is under a lot of stress currently because she can't remember things that happened throughout the day

## 2018-06-06 NOTE — ED Triage Notes (Signed)
Pt arrived with daughter and son. Per family report pt began to have memory loss and asked repetitive questions starting today around 1130. Per family pt has been under a lot of stress lately.Pt tearful in triage. In triage pt alert & oriented to person, place, and situation. Pt unable to name the year which is not normal for pt per family. Grips equal and strong, no weakness observed.

## 2018-06-06 NOTE — ED Notes (Signed)
Pt's family provided graham crackers and peanut butter
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
## 2018-06-07 NOTE — Discharge Instructions (Signed)
Your workup in the Emergency Department today was reassuring.  We did not find any specific abnormalities.  We recommend you drink plenty of fluids, take your regular medications and/or any new ones prescribed today, and follow up with the doctor(s) listed in these documents as recommended.  Return to the Emergency Department if you develop new or worsening symptoms that concern you.  

## 2018-06-07 NOTE — ED Provider Notes (Signed)
-----------------------------------------   1:00 AM on 06/07/2018 -----------------------------------------   Assumed care from Dr. Kerman Passey.  In short, Abigail Hatfield is a 73 y.o. female with a chief complaint of memory loss.  Refer to the original H&P for additional details.  The patient's MRI was normal and as per the plan previously discussed by Dr. Orpah Clinton and the patient and family, she will be discharged for outpatient follow-up.  I updated the patient and family and they agree with the plan.   Hinda Kehr, MD 06/07/18 0100

## 2018-06-08 ENCOUNTER — Telehealth: Payer: Self-pay

## 2018-06-08 NOTE — Telephone Encounter (Signed)
She will need to see either Raquel Sarna or Benjamine Mola

## 2018-06-08 NOTE — Telephone Encounter (Signed)
Patient is calling back to see what the status is, she said she has not heard and would like a call back. Patient has to work thursday

## 2018-06-08 NOTE — Telephone Encounter (Signed)
Copied from [place] [id number]. Topic: Appointment Scheduling - Scheduling Inquiry for Clinic >> Jun 07, 2018  8:15 AM Virl Axe D wrote: Reason for CRM: Pt called to schedule hospital follow up with Dr. Ancil Boozer. Nothing available in the next week. Please advise patient. UE#[phone] >> Jun 07, 2018  8:39 AM [place], Marland Kitchen wrote: Please advise where we can schedule pt for HFU

## 2018-06-09 NOTE — Telephone Encounter (Signed)
Pt is scheduled °

## 2018-06-11 ENCOUNTER — Inpatient Hospital Stay: Payer: PPO | Admitting: Family Medicine

## 2018-06-23 DIAGNOSIS — M9901 Segmental and somatic dysfunction of cervical region: Secondary | ICD-10-CM | POA: Diagnosis not present

## 2018-06-23 DIAGNOSIS — M5033 Other cervical disc degeneration, cervicothoracic region: Secondary | ICD-10-CM | POA: Diagnosis not present

## 2018-06-23 DIAGNOSIS — M9903 Segmental and somatic dysfunction of lumbar region: Secondary | ICD-10-CM | POA: Diagnosis not present

## 2018-06-23 DIAGNOSIS — M62830 Muscle spasm of back: Secondary | ICD-10-CM | POA: Diagnosis not present

## 2018-06-24 ENCOUNTER — Encounter: Payer: Self-pay | Admitting: Family Medicine

## 2018-06-24 ENCOUNTER — Ambulatory Visit (INDEPENDENT_AMBULATORY_CARE_PROVIDER_SITE_OTHER): Payer: PPO | Admitting: Family Medicine

## 2018-06-24 ENCOUNTER — Other Ambulatory Visit: Payer: Self-pay

## 2018-06-24 VITALS — BP 122/70 | HR 72 | Temp 98.2°F | Resp 12 | Ht 60.0 in | Wt 149.6 lb

## 2018-06-24 DIAGNOSIS — R413 Other amnesia: Secondary | ICD-10-CM

## 2018-06-24 DIAGNOSIS — Z638 Other specified problems related to primary support group: Secondary | ICD-10-CM

## 2018-06-24 NOTE — Progress Notes (Signed)
Name: Abigail Hatfield   MRN: [id number]    DOB: 31-Aug-1945   Date:06/24/2018       Progress Note  Subjective  Chief Complaint  Chief Complaint  Patient presents with  . Follow-up    ER states she lost an hr of her time    HPI  Pt presents for ER follow up.  Was seen 06/06/2018 for Memory loss.  Upon her arrival to ER, family was reporting that she lost about an hour of her day, had repetitive questioning, and unable to state the current year.  No other neuro deficit.  There was note of recent familial stress.  She had normal BMP, CBC, and Troponin was negative. CXR was normal, CT head was normal, and MRI Brain was normal.  She was recommended discharge with outpatient PCP follow up.  She has not had any episodes since then.   Stressors: Sister has been involved in "scams" for the last 2 years and has stolen money from several family members. She is having to protect her own mother from her sister's stealing.  Patient is dealing with her 51 year old mother with dementia and her own husband with dementia. Her children are somewhat helpful, and her siblings help to care for her aging mother.   She is taking Valerian - one pill in the morning and one pill at night.   Patient Active Problem List   Diagnosis Date Noted  . Obesity (BMI 30.0-34.9) 12/07/2017  . Degenerative joint disease of low back 12/07/2017  . Senile purpura ([place]) 12/07/2017  . Hyperlipidemia 12/21/2014    Past Surgical History:  Procedure Laterality Date  . ABDOMINAL HYSTERECTOMY  1980  . CATARACT EXTRACTION W/PHACO Right 06/24/2017   Procedure: CATARACT EXTRACTION PHACO AND INTRAOCULAR LENS PLACEMENT (IOC);  Surgeon: Birder Robson, MD;  Location: [facility] ORS;  Service: Ophthalmology;  Laterality: Right;  [place] 00:48.0 AP% 16.5 CDE 7.92 Fluid Pack Lot # [id number]  . CATARACT EXTRACTION W/PHACO Left 07/14/2017   Procedure: CATARACT EXTRACTION PHACO AND INTRAOCULAR LENS PLACEMENT (IOC);  Surgeon: Birder Robson, MD;   Location: [facility] ORS;  Service: Ophthalmology;  Laterality: Left;  [place] 00:36.0 AP% 16.9 CDE 6.08 Fluid Pack Lot # [id number] [place]  . CESAREAN SECTION  1971  . CHOLECYSTECTOMY  1972  . COLONOSCOPY WITH PROPOFOL N/A 12/24/2017   Procedure: COLONOSCOPY WITH PROPOFOL;  Surgeon: Jonathon Bellows, MD;  Location: [facility] ENDOSCOPY;  Service: Gastroenterology;  Laterality: N/A;  . EYE SURGERY    . retal fistula repair     Dr. Jamal Collin x 2    Family History  Problem Relation Age of Onset  . Diabetes Mother   . Hypertension Mother   . Heart attack Mother   . Heart failure Father   . Heart disease Father   . Diabetes Brother   . Hypertension Brother   . COPD Brother   . Diabetes Sister     Social History   Socioeconomic History  . Marital status: Married    Spouse name: Percell Miller  . Number of children: 2  . Years of education: some college  . Highest education level: 12th grade  Occupational History  . Occupation: Retired    Comment: since 04/2008  Social Needs  . Financial resource strain: Not hard at all  . Food insecurity:    Worry: Never true    Inability: Never true  . Transportation needs:    Medical: No    Non-medical: No  Tobacco Use  .  Smoking status: Former Smoker    Packs/day: 0.50    Years: 5.00    Pack years: 2.50    Types: Cigarettes    Last attempt to quit: 12/07/1988    Years since quitting: 29.5  . Smokeless tobacco: Never Used  . Tobacco comment: smoking cessation materials not required  Substance and Sexual Activity  . Alcohol use: No    Alcohol/week: 0.0 standard drinks  . Drug use: No  . Sexual activity: Not Currently    Partners: Male    Birth control/protection: None    Comment: patient's husband has prostate issues so she cannot with him. has not in 39yrs.  Lifestyle  . Physical activity:    Days per week: 0 days    Minutes per session: 0 min  . Stress: Not at all  Relationships  . Social connections:    Talks on phone: Patient  refused    Gets together: Patient refused    Attends religious service: Patient refused    Active member of club or organization: Patient refused    Attends meetings of clubs or organizations: Patient refused    Relationship status: Married  . Intimate partner violence:    Fear of current or ex partner: No    Emotionally abused: No    Physically abused: No    Forced sexual activity: No  Other Topics Concern  . Not on file  Social History Narrative   Mother is 59yrs old that she helps with       Husband has prostate issues      Patient has 7 deteriorated disc     Current Outpatient Medications:  .  Black Cohosh 540 MG CAPS, Take 1,620 mg by mouth at bedtime. , Disp: , Rfl:  .  Cholecalciferol (VITAMIN D3) 5000 UNITS CAPS, Take 5,000 Units by mouth every other day. , Disp: , Rfl:  .  OVER THE COUNTER MEDICATION, , Disp: , Rfl:  .  Red Yeast Rice 600 MG CAPS, Take 1,200-1,800 mg by mouth See admin instructions. Take 1800 mg by mouth in the morning and take 1200 mg by mouth at bedtime, Disp: , Rfl:  .  Valerian 500 MG CAPS, Take 250 mg by mouth daily., Disp: , Rfl:   Allergies  Allergen Reactions  . Moxifloxacin Hcl In Nacl Swelling  . Prednisone Other (See Comments)    Paranoid  . Penicillins Rash and Other (See Comments)    Has patient had a PCN reaction causing immediate rash, facial/tongue/throat swelling, SOB or lightheadedness with hypotension: Unknown Has patient had a PCN reaction causing severe rash involving mucus membranes or skin necrosis: No Has patient had a PCN reaction that required hospitalization: Yes Has patient had a PCN reaction occurring within the last 10 years: No If all of the above answers are "NO", then may proceed with Cephalosporin use.     I personally reviewed active problem list, medication list, allergies, notes from last encounter, lab results, imaging with the patient/caregiver today.   ROS Constitutional: Negative for fever or weight  change.  Respiratory: Negative for cough and shortness of breath.   Cardiovascular: Negative for chest pain or palpitations.  Gastrointestinal: Negative for abdominal pain, no bowel changes.  Musculoskeletal: Negative for gait problem or joint swelling.  Skin: Negative for rash.  Neurological: Negative for dizziness or headache.  No other specific complaints in a complete review of systems (except as listed in HPI above).  Objective  Vitals:   06/24/18 1047  BP: 122/70  Pulse: 72  Resp: 12  Temp: 98.2 F (36.8 C)  TempSrc: Oral  SpO2: 98%  Weight: 149 lb 9.6 oz (67.9 kg)  Height: 5' (1.524 m)   Body mass index is 29.22 kg/m.  Physical Exam Constitutional: Patient appears well-developed and well-nourished. No distress.  HENT: Head: Normocephalic and atraumatic. Ears: bilateral TMs with no erythema or effusion; Nose: Nose normal. Mouth/Throat: Oropharynx is clear and moist. No oropharyngeal exudate or tonsillar swelling.  Eyes: Conjunctivae and EOM are normal. No scleral icterus.  Pupils are equal, round, and reactive to light.  Neck: Normal range of motion. Neck supple. No JVD present. No thyromegaly present.  Cardiovascular: Normal rate, regular rhythm and normal heart sounds.  No murmur heard. No BLE edema. Pulmonary/Chest: Effort normal and breath sounds normal. No respiratory distress. Abdominal: Soft. Bowel sounds are normal, no distension. There is no tenderness. No masses. Musculoskeletal: Normal range of motion, no joint effusions. No gross deformities Neurological: Pt is alert and oriented to person, place, and time. No cranial nerve deficit. Coordination, balance, strength, speech and gait are normal.  Skin: Skin is warm and dry. No rash noted. No erythema.  Psychiatric: Patient has a normal mood and affect. behavior is normal. Judgment and thought content normal.  No results found for this or any previous visit (from the past 72 hour(s)).  PHQ2/9: Depression screen  [facility] 2/9 06/24/2018 12/08/2017 12/07/2017 12/03/2016 12/13/2015  Decreased Interest 0 0 0 0 0  Down, Depressed, Hopeless 0 0 0 0 0  PHQ - 2 Score 0 0 0 0 0  Altered sleeping 0 0 0 - -  Tired, decreased energy 0 0 2 - -  Change in appetite 0 0 0 - -  Feeling bad or failure about yourself  0 0 0 - -  Trouble concentrating 0 0 0 - -  Moving slowly or fidgety/restless 0 0 0 - -  Suicidal thoughts 0 0 0 - -  PHQ-9 Score 0 0 2 - -  Difficult doing work/chores Not difficult at all Not difficult at all Not difficult at all - -   PHQ-2/9 Result is negative.    Fall Risk: Fall Risk  06/24/2018 12/08/2017 12/07/2017 12/03/2016 12/13/2015  Falls in the past year? 0 No No No No  Number falls in past yr: 0 - - - -  Injury with Fall? 0 - - - -  Risk for fall due to : - Impaired vision - - -  Risk for fall due to: Comment - wears eyeglasses - - -   Functional Status Survey: Is the patient deaf or have difficulty hearing?: No Does the patient have difficulty seeing, even when wearing glasses/contacts?: No Does the patient have difficulty concentrating, remembering, or making decisions?: No Does the patient have difficulty walking or climbing stairs?: No Does the patient have difficulty dressing or bathing?: No Does the patient have difficulty doing errands alone such as visiting a doctor's office or shopping?: No   Assessment & Plan  1. Episode of memory loss 2. Stress due to family tension - Advised counseling, she declines referral to respite care at this time.  Advised she may continue Valerian, but needs to call our office and return if she is having worsening anxiety and we will consider prescription medication intervention.  If memory loss episode reoccurs she needs to come in to our office and we will refer to neurology.  Did discuss signs and symptoms of stroke in detail with the patient and she verbalizes  understanding.  Face-to-face time with patient was more than 25 minutes, >50% time spent  counseling and coordination of care

## 2018-06-24 NOTE — Patient Instructions (Signed)
-   Please call us if you have another episode of memory loss, and we will refer you to neurology for further evaluation. - Signs and symptoms of stroke: Facial droop, arm or leg weakness, slurred speech.  If you have any of these symptoms or have fevers, chest pain, or shortness of breath, please call 911 for evaluation.

## 2018-07-21 DIAGNOSIS — M9901 Segmental and somatic dysfunction of cervical region: Secondary | ICD-10-CM | POA: Diagnosis not present

## 2018-07-21 DIAGNOSIS — M62830 Muscle spasm of back: Secondary | ICD-10-CM | POA: Diagnosis not present

## 2018-07-21 DIAGNOSIS — M9903 Segmental and somatic dysfunction of lumbar region: Secondary | ICD-10-CM | POA: Diagnosis not present

## 2018-07-21 DIAGNOSIS — M5033 Other cervical disc degeneration, cervicothoracic region: Secondary | ICD-10-CM | POA: Diagnosis not present

## 2018-08-18 DIAGNOSIS — M62830 Muscle spasm of back: Secondary | ICD-10-CM | POA: Diagnosis not present

## 2018-08-18 DIAGNOSIS — M5033 Other cervical disc degeneration, cervicothoracic region: Secondary | ICD-10-CM | POA: Diagnosis not present

## 2018-08-18 DIAGNOSIS — M9901 Segmental and somatic dysfunction of cervical region: Secondary | ICD-10-CM | POA: Diagnosis not present

## 2018-08-18 DIAGNOSIS — M9903 Segmental and somatic dysfunction of lumbar region: Secondary | ICD-10-CM | POA: Diagnosis not present

## 2018-09-15 DIAGNOSIS — M5033 Other cervical disc degeneration, cervicothoracic region: Secondary | ICD-10-CM | POA: Diagnosis not present

## 2018-09-15 DIAGNOSIS — M9901 Segmental and somatic dysfunction of cervical region: Secondary | ICD-10-CM | POA: Diagnosis not present

## 2018-09-15 DIAGNOSIS — M62830 Muscle spasm of back: Secondary | ICD-10-CM | POA: Diagnosis not present

## 2018-09-15 DIAGNOSIS — M9903 Segmental and somatic dysfunction of lumbar region: Secondary | ICD-10-CM | POA: Diagnosis not present

## 2018-09-29 DIAGNOSIS — Z1283 Encounter for screening for malignant neoplasm of skin: Secondary | ICD-10-CM | POA: Diagnosis not present

## 2018-09-29 DIAGNOSIS — L812 Freckles: Secondary | ICD-10-CM | POA: Diagnosis not present

## 2018-09-29 DIAGNOSIS — D1800 Hemangioma unspecified site: Secondary | ICD-10-CM | POA: Diagnosis not present

## 2018-09-29 DIAGNOSIS — L821 Other seborrheic keratosis: Secondary | ICD-10-CM | POA: Diagnosis not present

## 2018-09-29 DIAGNOSIS — D692 Other nonthrombocytopenic purpura: Secondary | ICD-10-CM | POA: Diagnosis not present

## 2018-09-29 DIAGNOSIS — L818 Other specified disorders of pigmentation: Secondary | ICD-10-CM | POA: Diagnosis not present

## 2018-09-29 DIAGNOSIS — L578 Other skin changes due to chronic exposure to nonionizing radiation: Secondary | ICD-10-CM | POA: Diagnosis not present

## 2018-09-29 DIAGNOSIS — I788 Other diseases of capillaries: Secondary | ICD-10-CM | POA: Diagnosis not present

## 2018-10-13 DIAGNOSIS — M9901 Segmental and somatic dysfunction of cervical region: Secondary | ICD-10-CM | POA: Diagnosis not present

## 2018-10-13 DIAGNOSIS — M62830 Muscle spasm of back: Secondary | ICD-10-CM | POA: Diagnosis not present

## 2018-10-13 DIAGNOSIS — M5033 Other cervical disc degeneration, cervicothoracic region: Secondary | ICD-10-CM | POA: Diagnosis not present

## 2018-10-13 DIAGNOSIS — M9903 Segmental and somatic dysfunction of lumbar region: Secondary | ICD-10-CM | POA: Diagnosis not present

## 2018-11-10 DIAGNOSIS — M9903 Segmental and somatic dysfunction of lumbar region: Secondary | ICD-10-CM | POA: Diagnosis not present

## 2018-11-10 DIAGNOSIS — M9901 Segmental and somatic dysfunction of cervical region: Secondary | ICD-10-CM | POA: Diagnosis not present

## 2018-11-10 DIAGNOSIS — M62830 Muscle spasm of back: Secondary | ICD-10-CM | POA: Diagnosis not present

## 2018-11-10 DIAGNOSIS — M5033 Other cervical disc degeneration, cervicothoracic region: Secondary | ICD-10-CM | POA: Diagnosis not present

## 2018-12-01 DIAGNOSIS — M722 Plantar fascial fibromatosis: Secondary | ICD-10-CM | POA: Diagnosis not present

## 2018-12-03 ENCOUNTER — Other Ambulatory Visit: Payer: Self-pay | Admitting: Family Medicine

## 2018-12-07 ENCOUNTER — Ambulatory Visit (INDEPENDENT_AMBULATORY_CARE_PROVIDER_SITE_OTHER): Payer: PPO

## 2018-12-07 VITALS — Ht 60.0 in | Wt 150.0 lb

## 2018-12-07 DIAGNOSIS — Z0000 Encounter for general adult medical examination without abnormal findings: Secondary | ICD-10-CM | POA: Diagnosis not present

## 2018-12-07 DIAGNOSIS — Z1231 Encounter for screening mammogram for malignant neoplasm of breast: Secondary | ICD-10-CM | POA: Diagnosis not present

## 2018-12-07 NOTE — Progress Notes (Signed)
Subjective:   Abigail Hatfield is a 73 y.o. female who presents for Medicare Annual (Subsequent) preventive examination.  Virtual Visit via Telephone Note  I connected with Abigail Hatfield on 12/07/18 at  2:10 PM EDT by telephone and verified that I am speaking with the correct person using two identifiers.  Medicare Annual Wellness visit completed telephonically due to Covid-19 pandemic.   Location: Patient: home   Provider: office   I discussed the limitations, risks, security and privacy concerns of performing an evaluation and management service by telephone and the availability of in person appointments. The patient expressed understanding and agreed to proceed.  Some vital signs may be absent or patient reported.   Abigail Marker, LPN    Review of Systems:   Cardiac Risk Factors include: advanced age (>84men, >72 women);dyslipidemia     Objective:     Vitals: Ht 5' (1.524 m)   Wt 150 lb (68 kg)   BMI 29.29 kg/m   Body mass index is 29.29 kg/m.  Advanced Directives 12/07/2018 06/06/2018 12/24/2017 12/08/2017 07/14/2017 06/24/2017 12/03/2016  Does Patient Have a Medical Advance Directive? Yes No Yes Yes Yes Yes Yes  Type of Paramedic of [place];Living will - Abigail Hatfield;Living will Abigail Hatfield;Living will Abigail Hatfield;Living will Abigail Hatfield;Living will -  Does patient want to make changes to medical advance directive? - - - - No - Patient declined No - Patient declined -  Copy of [place] in Chart? No - copy requested - Yes No - copy requested No - copy requested No - copy requested -  Would patient like information on creating a medical advance directive? - No - Patient declined No - Patient declined - - - -    Tobacco Social History   Tobacco Use  Smoking Status Former Smoker  . Packs/day: 0.50  . Years: 5.00  . Pack years: 2.50  . Types: Cigarettes  .  Quit date: 12/07/1988  . Years since quitting: 30.0  Smokeless Tobacco Never Used  Tobacco Comment   smoking cessation materials not required     Counseling given: Not Answered Comment: smoking cessation materials not required   Clinical Intake:  Pre-visit preparation completed: Yes  Pain : No/denies pain     BMI - recorded: 29.29 Nutritional Status: BMI 25 -29 Overweight Nutritional Risks: None Diabetes: No  How often do you need to have someone help you when you read instructions, pamphlets, or other written materials from your doctor or pharmacy?: 1 - Never  Interpreter Needed?: No  Information entered by :: Abigail Marker LPN  Past Medical History:  Diagnosis Date  . Allergy   . Cataract   . Hyperlipidemia   . Medical history non-contributory    Past Surgical History:  Procedure Laterality Date  . ABDOMINAL HYSTERECTOMY  1980  . CATARACT EXTRACTION W/PHACO Right 06/24/2017   Procedure: CATARACT EXTRACTION PHACO AND INTRAOCULAR LENS PLACEMENT (IOC);  Surgeon: Birder Robson, MD;  Location: [facility] ORS;  Service: Ophthalmology;  Laterality: Right;  [place] 00:48.0 AP% 16.5 CDE 7.92 Fluid Pack Lot # [id number]  . CATARACT EXTRACTION W/PHACO Left 07/14/2017   Procedure: CATARACT EXTRACTION PHACO AND INTRAOCULAR LENS PLACEMENT (IOC);  Surgeon: Birder Robson, MD;  Location: [facility] ORS;  Service: Ophthalmology;  Laterality: Left;  [place] 00:36.0 AP% 16.9 CDE 6.08 Fluid Pack Lot # [id number] [place]  . CESAREAN SECTION  1971  . CHOLECYSTECTOMY  1972  . COLONOSCOPY WITH PROPOFOL N/A 12/24/2017   Procedure: COLONOSCOPY WITH PROPOFOL;  Surgeon: Jonathon Bellows, MD;  Location: [facility] ENDOSCOPY;  Service: Gastroenterology;  Laterality: N/A;  . EYE SURGERY    . retal fistula repair     Dr. Jamal Collin x 2   Family History  Problem Relation Age of Onset  . Diabetes Mother   . Hypertension Mother   . Heart attack Mother   . Heart failure Father   . Heart disease Father   .  Diabetes Brother   . Hypertension Brother   . COPD Brother   . Diabetes Sister    Social History   Socioeconomic History  . Marital status: Married    Spouse name: Abigail Hatfield  . Number of children: 2  . Years of education: some college  . Highest education level: 12th grade  Occupational History  . Occupation: Retired    Comment: since 04/2008  Social Needs  . Financial resource strain: Not hard at all  . Food insecurity    Worry: Never true    Inability: Never true  . Transportation needs    Medical: No    Non-medical: No  Tobacco Use  . Smoking status: Former Smoker    Packs/day: 0.50    Years: 5.00    Pack years: 2.50    Types: Cigarettes    Quit date: 12/07/1988    Years since quitting: 30.0  . Smokeless tobacco: Never Used  . Tobacco comment: smoking cessation materials not required  Substance and Sexual Activity  . Alcohol use: No    Alcohol/week: 0.0 standard drinks  . Drug use: No  . Sexual activity: Not Currently    Partners: Male    Birth control/protection: None    Comment: patient's husband has prostate issues so she cannot with him. has not in 23yrs.  Lifestyle  . Physical activity    Days per week: 0 days    Minutes per session: 0 min  . Stress: Very much  Relationships  . Social Herbalist on phone: Patient refused    Gets together: Patient refused    Attends religious service: Patient refused    Active member of club or organization: Patient refused    Attends meetings of clubs or organizations: Patient refused    Relationship status: Married  Other Topics Concern  . Not on file  Social History Narrative   Mother is 90yrs old that she helps with       Husband has prostate issues and early dementia      Patient has 7 deteriorated disc    Outpatient Encounter Medications as of 12/07/2018  Medication Sig  . Black Cohosh 540 MG CAPS Take 1,620 mg by mouth at bedtime.   . Cholecalciferol (VITAMIN D3) 5000 UNITS CAPS Take 5,000 Units by  mouth every other day.   Marland Kitchen OVER THE COUNTER MEDICATION 1 capsule 2 (two) times daily.   . Red Yeast Rice 600 MG CAPS Take 1,200-1,800 mg by mouth See admin instructions. Take 1800 mg by mouth in the morning and take 1200 mg by mouth at bedtime  . Valerian 500 MG CAPS Take 250 mg by mouth daily.   No facility-administered encounter medications on file as of 12/07/2018.     Activities of Daily Living In your present state of health, do you have any difficulty performing the following activities: 12/07/2018 06/24/2018  Hearing? N N  Comment declines hearing aids -  Vision? N N  Comment - -  Difficulty concentrating or making decisions? N N  Walking or climbing stairs? N N  Dressing or bathing? N N  Doing errands, shopping? N N  Preparing Food and eating ? N -  Comment - -  Using the Toilet? N -  In the past six months, have you accidently leaked urine? N -  Do you have problems with loss of bowel control? N -  Managing your Medications? N -  Managing your Finances? N -  Housekeeping or managing your Housekeeping? N -  Some recent data might be hidden    Patient Care Team: Steele Sizer, MD as PCP - General (Family Medicine) Birder Robson, MD as Consulting Physician (Ophthalmology) Ralene Bathe, MD as Consulting Physician (Dermatology)    Assessment:   This is a routine wellness examination for [employer].  Exercise Activities and Dietary recommendations Current Exercise Habits: The patient does not participate in regular exercise at present, Exercise limited by: None identified  Goals    . DIET - INCREASE WATER INTAKE     Recommend to drink at least 6-8 8oz glasses of water per day.       Fall Risk Fall Risk  12/07/2018 06/24/2018 12/08/2017 12/07/2017 12/03/2016  Falls in the past year? 0 0 No No No  Number falls in past yr: 0 0 - - -  Injury with Fall? 0 0 - - -  Risk for fall due to : - - Impaired vision - -  Risk for fall due to: Comment - - wears eyeglasses - -   Follow up Falls prevention discussed - - - -   FALL RISK PREVENTION PERTAINING TO THE HOME:  Any stairs in or around the home? Yes  If so, do they handrails? Yes   Home free of loose throw rugs in walkways, pet beds, electrical cords, etc? Yes  Adequate lighting in your home to reduce risk of falls? Yes   ASSISTIVE DEVICES UTILIZED TO PREVENT FALLS:  Life alert? No  Use of a cane, Diop or w/c? No  Grab bars in the bathroom? No  Shower chair or bench in shower? No  Elevated toilet seat or a handicapped toilet? No   DME ORDERS:  DME order needed?  No   TIMED UP AND GO:  Was the test performed? No .   Education: Fall risk prevention has been discussed.  Intervention(s) required? No   Depression Screen PHQ 2/9 Scores 12/07/2018 06/24/2018 12/08/2017 12/07/2017  PHQ - 2 Score 0 0 0 0  PHQ- 9 Score - 0 0 2     Cognitive Function     6CIT Screen 12/07/2018 12/08/2017  What Year? 0 points 0 points  What month? 0 points 0 points  What time? 0 points 0 points  Count back from 20 0 points 0 points  Months in reverse 0 points 0 points  Repeat phrase 0 points 0 points  Total Score 0 0    There is no immunization history for the selected administration types on file for this patient.  Qualifies for Shingles Vaccine? Yes  . Due for Shingrix. Education has been provided regarding the importance of this vaccine. Pt has been advised to call insurance company to determine out of pocket expense. Advised may also receive vaccine at local pharmacy or Health Dept. Verbalized acceptance and understanding.  Tdap: Up to date   Flu Vaccine: Due for Flu vaccine. Does the patient want to receive this vaccine today?  No . Education has been provided regarding the  importance of this vaccine but still declined. Advised may receive this vaccine at local pharmacy or Health Dept. Aware to provide a copy of the vaccination record if obtained from local pharmacy or Health Dept. Verbalized acceptance  and understanding.  Pneumococcal Vaccine: Due for Pneumococcal vaccine. Does the patient want to receive this vaccine today?  No . Education has been provided regarding the importance of this vaccine but still declined. Advised may receive this vaccine at local pharmacy or Health Dept. Aware to provide a copy of the vaccination record if obtained from local pharmacy or Health Dept. Verbalized acceptance and understanding.   Screening Tests Health Maintenance  Topic Date Due  . INFLUENZA VACCINE  11/13/2018  . TETANUS/TDAP  12/08/2018 (Originally 12/03/1964)  . PNA vac Low Risk Adult (1 of 2 - PCV13) 12/08/2018 (Originally 12/02/2009)  . MAMMOGRAM  01/12/2019  . COLONOSCOPY  12/25/2022  . DEXA SCAN  Completed  . Hepatitis C Screening  Completed    Cancer Screenings:  Colorectal Screening: Completed 12/24/17. Repeat every 5 years;   Mammogram: Completed 01/11/18. Repeat every year. Ordered today. Pt provided with contact information and advised to call to schedule appt.   Bone Density: Completed 01/11/18. Results reflect NORMAL. Repeat every 2 years.   Lung Cancer Screening: (Low Dose CT Chest recommended if Age 51-80 years, 30 pack-year currently smoking OR have quit w/in 15years.) does not qualify.   Additional Screening:  Hepatitis C Screening: does qualify; Completed 11/26/16  Vision Screening: Recommended annual ophthalmology exams for early detection of glaucoma and other disorders of the eye. Is the patient up to date with their annual eye exam?  Yes  Who is the provider or what is the name of the office in which the pt attends annual eye exams? [place] Screening: Recommended annual dental exams for proper oral hygiene  Community Resource Referral:  CRR required this visit?  No      Plan:     I have personally reviewed and addressed the Medicare Annual Wellness questionnaire and have noted the following in the patient's chart:  A. Medical and social  history B. Use of alcohol, tobacco or illicit drugs  C. Current medications and supplements D. Functional ability and status E.  Nutritional status F.  Physical activity G. Advance directives H. List of other physicians I.  Hospitalizations, surgeries, and ER visits in previous 12 months J.  [place] such as hearing and vision if needed, cognitive and depression L. Referrals and appointments   In addition, I have reviewed and discussed with patient certain preventive protocols, quality metrics, and best practice recommendations. A written personalized care plan for preventive services as well as general preventive health recommendations were provided to patient.   Signed,  Abigail Marker, LPN Nurse Health Advisor   Nurse Notes: pt c/o caregiver stress with mom and husband due to worsening dementia. Declined my offer to C3 team for community resources and support. Patient also had questions about diet for hyperlipidemia and specific food to eat to lower LDL. Pt declined offer to CCM for management but advised her to contact [employer] for nutritionist information provided as a service through [facility] Pt to follow up for CPE on 12/14/18 with Dr. Ancil Boozer and requests lipid panel and CMP, advised these were routine labs that would be drawn at appt.

## 2018-12-07 NOTE — Patient Instructions (Signed)
Abigail Hatfield , Thank you for taking time to come for your Medicare Wellness Visit. I appreciate your ongoing commitment to your health goals. Please review the following plan we discussed and let me know if I can assist you in the future.   Screening recommendations/referrals: Colonoscopy: done 12/24/17 Mammogram: done 01/11/18. Please call [phone] to schedule your mammogram.  Bone Density: done 01/11/18 Recommended yearly ophthalmology/optometry visit for glaucoma screening and checkup Recommended yearly dental visit for hygiene and checkup  Vaccinations: Influenza vaccine: postponed Pneumococcal vaccine: postponed Tdap vaccine: done 02/01/13 Shingles vaccine: Shingrix discussed. Please contact your pharmacy for coverage information.   Advanced directives: Please bring a copy of your health care power of attorney and living will to the office at your convenience.  Conditions/risks identified: recommend increasing physical activity   Next appointment: Please follow up in one year for your Medicare Annual Wellness visit.     Preventive Care 39 Years and Older, Female Preventive care refers to lifestyle choices and visits with your health care provider that can promote health and wellness. What does preventive care include?  A yearly physical exam. This is also called an annual well check.  Dental exams once or twice a year.  Routine eye exams. Ask your health care provider how often you should have your eyes checked.  Personal lifestyle choices, including:  Daily care of your teeth and gums.  Regular physical activity.  Eating a healthy diet.  Avoiding tobacco and drug use.  Limiting alcohol use.  Practicing safe sex.  Taking low-dose aspirin every day.  Taking vitamin and mineral supplements as recommended by your health care provider. What happens during an annual well check? The services and screenings done by your health care provider during your annual well  check will depend on your age, overall health, lifestyle risk factors, and family history of disease. Counseling  Your health care provider may ask you questions about your:  Alcohol use.  Tobacco use.  Drug use.  Emotional well-being.  Home and relationship well-being.  Sexual activity.  Eating habits.  History of falls.  Memory and ability to understand (cognition).  Work and work Statistician.  Reproductive health. Screening  You may have the following tests or measurements:  Height, weight, and BMI.  Blood pressure.  Lipid and cholesterol levels. These may be checked every 5 years, or more frequently if you are over 27 years old.  Skin check.  Lung cancer screening. You may have this screening every year starting at age 30 if you have a 30-pack-year history of smoking and currently smoke or have quit within the past 15 years.  Fecal occult blood test (FOBT) of the stool. You may have this test every year starting at age 30.  Flexible sigmoidoscopy or colonoscopy. You may have a sigmoidoscopy every 5 years or a colonoscopy every 10 years starting at age 12.  Hepatitis C blood test.  Hepatitis B blood test.  Sexually transmitted disease (STD) testing.  Diabetes screening. This is done by checking your blood sugar (glucose) after you have not eaten for a while (fasting). You may have this done every 1-3 years.  Bone density scan. This is done to screen for osteoporosis. You may have this done starting at age 25.  Mammogram. This may be done every 1-2 years. Talk to your health care provider about how often you should have regular mammograms. Talk with your health care provider about your test results, treatment options, and if necessary, the need for more tests.  Vaccines  Your health care provider may recommend certain vaccines, such as:  Influenza vaccine. This is recommended every year.  Tetanus, diphtheria, and acellular pertussis (Tdap, Td) vaccine. You  may need a Td booster every 10 years.  Zoster vaccine. You may need this after age 41.  Pneumococcal 13-valent conjugate (PCV13) vaccine. One dose is recommended after age 63.  Pneumococcal polysaccharide (PPSV23) vaccine. One dose is recommended after age 49. Talk to your health care provider about which screenings and vaccines you need and how often you need them. This information is not intended to replace advice given to you by your health care provider. Make sure you discuss any questions you have with your health care provider. Document Released: 04/27/2015 Document Revised: 12/19/2015 Document Reviewed: 01/30/2015 Elsevier Interactive Patient Education  2017 [place] Prevention in the Home Falls can cause injuries. They can happen to people of all ages. There are many things you can do to make your home safe and to help prevent falls. What can I do on the outside of my home?  Regularly fix the edges of walkways and driveways and fix any cracks.  Remove anything that might make you trip as you walk through a door, such as a raised step or threshold.  Trim any bushes or trees on the path to your home.  Use bright outdoor lighting.  Clear any walking paths of anything that might make someone trip, such as rocks or tools.  Regularly check to see if handrails are loose or broken. Make sure that both sides of any steps have handrails.  Any raised decks and porches should have guardrails on the edges.  Have any leaves, snow, or ice cleared regularly.  Use sand or salt on walking paths during winter.  Clean up any spills in your garage right away. This includes oil or grease spills. What can I do in the bathroom?  Use night lights.  Install grab bars by the toilet and in the tub and shower. Do not use towel bars as grab bars.  Use non-skid mats or decals in the tub or shower.  If you need to sit down in the shower, use a plastic, non-slip stool.  Keep the floor  dry. Clean up any water that spills on the floor as soon as it happens.  Remove soap buildup in the tub or shower regularly.  Attach bath mats securely with double-sided non-slip rug tape.  Do not have throw rugs and other things on the floor that can make you trip. What can I do in the bedroom?  Use night lights.  Make sure that you have a light by your bed that is easy to reach.  Do not use any sheets or blankets that are too big for your bed. They should not hang down onto the floor.  Have a firm chair that has side arms. You can use this for support while you get dressed.  Do not have throw rugs and other things on the floor that can make you trip. What can I do in the kitchen?  Clean up any spills right away.  Avoid walking on wet floors.  Keep items that you use a lot in easy-to-reach places.  If you need to reach something above you, use a strong step stool that has a grab bar.  Keep electrical cords out of the way.  Do not use floor polish or wax that makes floors slippery. If you must use wax, use non-skid floor wax.  Do not have throw rugs and other things on the floor that can make you trip. What can I do with my stairs?  Do not leave any items on the stairs.  Make sure that there are handrails on both sides of the stairs and use them. Fix handrails that are broken or loose. Make sure that handrails are as long as the stairways.  Check any carpeting to make sure that it is firmly attached to the stairs. Fix any carpet that is loose or worn.  Avoid having throw rugs at the top or bottom of the stairs. If you do have throw rugs, attach them to the floor with carpet tape.  Make sure that you have a light switch at the top of the stairs and the bottom of the stairs. If you do not have them, ask someone to add them for you. What else can I do to help prevent falls?  Wear shoes that:  Do not have high heels.  Have rubber bottoms.  Are comfortable and fit you  well.  Are closed at the toe. Do not wear sandals.  If you use a stepladder:  Make sure that it is fully opened. Do not climb a closed stepladder.  Make sure that both sides of the stepladder are locked into place.  Ask someone to hold it for you, if possible.  Clearly mark and make sure that you can see:  Any grab bars or handrails.  First and last steps.  Where the edge of each step is.  Use tools that help you move around (mobility aids) if they are needed. These include:  Canes.  Walkers.  Scooters.  Crutches.  Turn on the lights when you go into a dark area. Replace any light bulbs as soon as they burn out.  Set up your furniture so you have a clear path. Avoid moving your furniture around.  If any of your floors are uneven, fix them.  If there are any pets around you, be aware of where they are.  Review your medicines with your doctor. Some medicines can make you feel dizzy. This can increase your chance of falling. Ask your doctor what other things that you can do to help prevent falls. This information is not intended to replace advice given to you by your health care provider. Make sure you discuss any questions you have with your health care provider. Document Released: 01/25/2009 Document Revised: 09/06/2015 Document Reviewed: 05/05/2014 Elsevier Interactive Patient Education  2017 [employer].

## 2018-12-08 DIAGNOSIS — M9901 Segmental and somatic dysfunction of cervical region: Secondary | ICD-10-CM | POA: Diagnosis not present

## 2018-12-08 DIAGNOSIS — M62830 Muscle spasm of back: Secondary | ICD-10-CM | POA: Diagnosis not present

## 2018-12-08 DIAGNOSIS — M5033 Other cervical disc degeneration, cervicothoracic region: Secondary | ICD-10-CM | POA: Diagnosis not present

## 2018-12-08 DIAGNOSIS — M9903 Segmental and somatic dysfunction of lumbar region: Secondary | ICD-10-CM | POA: Diagnosis not present

## 2018-12-09 ENCOUNTER — Ambulatory Visit: Payer: Self-pay

## 2018-12-10 ENCOUNTER — Ambulatory Visit: Payer: Self-pay

## 2018-12-10 ENCOUNTER — Ambulatory Visit: Payer: PPO

## 2018-12-14 ENCOUNTER — Encounter: Payer: Self-pay | Admitting: Family Medicine

## 2018-12-14 ENCOUNTER — Ambulatory Visit (INDEPENDENT_AMBULATORY_CARE_PROVIDER_SITE_OTHER): Payer: PPO | Admitting: Family Medicine

## 2018-12-14 ENCOUNTER — Other Ambulatory Visit: Payer: Self-pay

## 2018-12-14 VITALS — BP 110/64 | HR 63 | Temp 96.9°F | Resp 16 | Ht 60.0 in | Wt 152.5 lb

## 2018-12-14 DIAGNOSIS — R413 Other amnesia: Secondary | ICD-10-CM | POA: Diagnosis not present

## 2018-12-14 DIAGNOSIS — R739 Hyperglycemia, unspecified: Secondary | ICD-10-CM | POA: Diagnosis not present

## 2018-12-14 DIAGNOSIS — D692 Other nonthrombocytopenic purpura: Secondary | ICD-10-CM | POA: Diagnosis not present

## 2018-12-14 DIAGNOSIS — Z0000 Encounter for general adult medical examination without abnormal findings: Secondary | ICD-10-CM | POA: Diagnosis not present

## 2018-12-14 DIAGNOSIS — E7800 Pure hypercholesterolemia, unspecified: Secondary | ICD-10-CM | POA: Diagnosis not present

## 2018-12-14 NOTE — Patient Instructions (Signed)
Preventive Care 38 Years and Older, Female Preventive care refers to lifestyle choices and visits with your health care provider that can promote health and wellness. This includes:  A yearly physical exam. This is also called an annual well check.  Regular dental and eye exams.  Immunizations.  Screening for certain conditions.  Healthy lifestyle choices, such as diet and exercise. What can I expect for my preventive care visit? Physical exam Your health care provider will check:  Height and weight. These may be used to calculate body mass index (BMI), which is a measurement that tells if you are at a healthy weight.  Heart rate and blood pressure.  Your skin for abnormal spots. Counseling Your health care provider may ask you questions about:  Alcohol, tobacco, and drug use.  Emotional well-being.  Home and relationship well-being.  Sexual activity.  Eating habits.  History of falls.  Memory and ability to understand (cognition).  Work and work Statistician.  Pregnancy and menstrual history. What immunizations do I need?  Influenza (flu) vaccine  This is recommended every year. Tetanus, diphtheria, and pertussis (Tdap) vaccine  You may need a Td booster every 10 years. Varicella (chickenpox) vaccine  You may need this vaccine if you have not already been vaccinated. Zoster (shingles) vaccine  You may need this after age 33. Pneumococcal conjugate (PCV13) vaccine  One dose is recommended after age 33. Pneumococcal polysaccharide (PPSV23) vaccine  One dose is recommended after age 72. Measles, mumps, and rubella (MMR) vaccine  You may need at least one dose of MMR if you were born in 1957 or later. You may also need a second dose. Meningococcal conjugate (MenACWY) vaccine  You may need this if you have certain conditions. Hepatitis A vaccine  You may need this if you have certain conditions or if you travel or work in places where you may be exposed  to hepatitis A. Hepatitis B vaccine  You may need this if you have certain conditions or if you travel or work in places where you may be exposed to hepatitis B. Haemophilus influenzae type b (Hib) vaccine  You may need this if you have certain conditions. You may receive vaccines as individual doses or as more than one vaccine together in one shot (combination vaccines). Talk with your health care provider about the risks and benefits of combination vaccines. What tests do I need? Blood tests  Lipid and cholesterol levels. These may be checked every 5 years, or more frequently depending on your overall health.  Hepatitis C test.  Hepatitis B test. Screening  Lung cancer screening. You may have this screening every year starting at age 39 if you have a 30-pack-year history of smoking and currently smoke or have quit within the past 15 years.  Colorectal cancer screening. All adults should have this screening starting at age 36 and continuing until age 15. Your health care provider may recommend screening at age 23 if you are at increased risk. You will have tests every 1-10 years, depending on your results and the type of screening test.  Diabetes screening. This is done by checking your blood sugar (glucose) after you have not eaten for a while (fasting). You may have this done every 1-3 years.  Mammogram. This may be done every 1-2 years. Talk with your health care provider about how often you should have regular mammograms.  BRCA-related cancer screening. This may be done if you have a family history of breast, ovarian, tubal, or peritoneal cancers.  Other tests  Sexually transmitted disease (STD) testing.  Bone density scan. This is done to screen for osteoporosis. You may have this done starting at age 55. Follow these instructions at home: Eating and drinking  Eat a diet that includes fresh fruits and vegetables, whole grains, lean protein, and low-fat dairy products. Limit  your intake of foods with high amounts of sugar, saturated fats, and salt.  Take vitamin and mineral supplements as recommended by your health care provider.  Do not drink alcohol if your health care provider tells you not to drink.  If you drink alcohol: ? Limit how much you have to 0-1 drink a day. ? Be aware of how much alcohol is in your drink. In the [place] one drink equals one 12 oz bottle of beer (355 mL), one 5 oz glass of wine (148 mL), or one 1 oz glass of hard liquor (44 mL). Lifestyle  Take daily care of your teeth and gums.  Stay active. Exercise for at least 30 minutes on 5 or more days each week.  Do not use any products that contain nicotine or tobacco, such as cigarettes, e-cigarettes, and chewing tobacco. If you need help quitting, ask your health care provider.  If you are sexually active, practice safe sex. Use a condom or other form of protection in order to prevent STIs (sexually transmitted infections).  Talk with your health care provider about taking a low-dose aspirin or statin. What's next?  Go to your health care provider once a year for a well check visit.  Ask your health care provider how often you should have your eyes and teeth checked.  Stay up to date on all vaccines. This information is not intended to replace advice given to you by your health care provider. Make sure you discuss any questions you have with your health care provider. Document Released: 04/27/2015 Document Revised: 03/25/2018 Document Reviewed: 03/25/2018 Elsevier Patient Education  2020 [employer].

## 2018-12-14 NOTE — Progress Notes (Signed)
Name: Abigail Hatfield   MRN: [id number]    DOB: [birth year]   Date:12/14/2018       Progress Note  Subjective  Chief Complaint  Chief Complaint  Patient presents with  . Annual Exam    HPI  Patient presents for annual CPE and follow up  Hyperlipidemia: she is taking red yeast rice, she refuses prescriptions. She is willing to have labs rechecked  Stress; she is very stressed carrying for her husband and also elderly mother, she went to [facility] back in Feb with a sudden onset of memory loss, evaluation was negative and sent home with instructions to return or go see neurologist if symptoms returned, she has been asymptomatic since. Discussed counseling and medications for depression/anxiety but she refuses at this time  Hyperglycemia: on labs done at [facility], denies polyphagia, polydipsia or polyuria  Senile purpura: on both arms, reassurance given  She refuses immunizations also   Diet: discussed importance of increasing calcium intake  Exercise: discussed importance of exercise   USPSTF grade A and B recommendations    Office Visit from 12/14/2018 in [facility]  AUDIT-C Score  0     Depression: Phq 9 is  negative Depression screen [facility] 2/9 12/14/2018 12/07/2018 06/24/2018 12/08/2017 12/07/2017  Decreased Interest 0 0 0 0 0  Down, Depressed, Hopeless 0 0 0 0 0  PHQ - 2 Score 0 0 0 0 0  Altered sleeping 0 - 0 0 0  Tired, decreased energy 0 - 0 0 2  Change in appetite 0 - 0 0 0  Feeling bad or failure about yourself  0 - 0 0 0  Trouble concentrating 0 - 0 0 0  Moving slowly or fidgety/restless 0 - 0 0 0  Suicidal thoughts 0 - 0 0 0  PHQ-9 Score 0 - 0 0 2  Difficult doing work/chores - - Not difficult at all Not difficult at all Not difficult at all   Hypertension: BP Readings from Last 3 Encounters:  12/14/18 110/64  06/24/18 122/70  06/07/18 (!) 96/50   Obesity: Wt Readings from Last 3 Encounters:  12/14/18 152 lb 8 oz (69.2 kg)  12/07/18 150 lb (68 kg)   06/24/18 149 lb 9.6 oz (67.9 kg)   BMI Readings from Last 3 Encounters:  12/14/18 29.78 kg/m  12/07/18 29.29 kg/m  06/24/18 29.22 kg/m     Hep C Screening: 2018  STD testing and prevention (HIV/chl/gon/syphilis): N/A Intimate partner violence: negative screen  Sexual History/Pain during Intercourse: not sexually active in 100 years, husband had medical problems Menstrual History/LMP/Abnormal Bleeding: s/p hysterectomy  Incontinence Symptoms: no problems   Breast cancer:  - Last Mammogram: 12/2017  - BRCA gene screening: N/A  Osteoporosis Screening: 12/2017  Cervical cancer screening: N/A  Skin cancer: discussed atypical lesions - she sees Dr. Nehemiah Massed  Colorectal cancer: repeat 2024  Lung cancer:   Low Dose CT Chest recommended if Age 46-80 years, 30 pack-year currently smoking OR have quit w/in 15years. Patient does not qualify.   ECG:05/2022   Advanced Care Planning: A voluntary discussion about advance care planning including the explanation and discussion of advance directives.  Discussed health care proxy and Living will, and the patient was able to identify a health care proxy as her daughter .  Patient does have a living will at present time.  Lipids: Lab Results  Component Value Date   CHOL 239 (H) 12/07/2017   CHOL 205 (H) 11/26/2016   CHOL 176 12/13/2015  Lab Results  Component Value Date   HDL 78 12/07/2017   HDL 76 11/26/2016   HDL 80 12/13/2015   Lab Results  Component Value Date   LDLCALC 141 (H) 12/07/2017   LDLCALC 109 (H) 11/26/2016   LDLCALC 79 12/13/2015   Lab Results  Component Value Date   TRIG 90 12/07/2017   TRIG 101 11/26/2016   TRIG 84 12/13/2015   Lab Results  Component Value Date   CHOLHDL 3.1 12/07/2017   CHOLHDL 2.7 11/26/2016   CHOLHDL 2.2 12/13/2015   No results found for: LDLDIRECT  Glucose: Glucose, Bld  Date Value Ref Range Status  06/06/2018 100 (H) 70 - 99 mg/dL Final  11/26/2016 81 65 - 99 mg/dL Final   12/13/2015 91 65 - 99 mg/dL Final    Patient Active Problem List   Diagnosis Date Noted  . Obesity (BMI 30.0-34.9) 12/07/2017  . Degenerative joint disease of low back 12/07/2017  . Senile purpura ([place]) 12/07/2017  . Hyperlipidemia 12/21/2014    Past Surgical History:  Procedure Laterality Date  . ABDOMINAL HYSTERECTOMY  1980  . CATARACT EXTRACTION W/PHACO Right 06/24/2017   Procedure: CATARACT EXTRACTION PHACO AND INTRAOCULAR LENS PLACEMENT (IOC);  Surgeon: Birder Robson, MD;  Location: [facility] ORS;  Service: Ophthalmology;  Laterality: Right;  [place] 00:48.0 AP% 16.5 CDE 7.92 Fluid Pack Lot # [id number]  . CATARACT EXTRACTION W/PHACO Left 07/14/2017   Procedure: CATARACT EXTRACTION PHACO AND INTRAOCULAR LENS PLACEMENT (IOC);  Surgeon: Birder Robson, MD;  Location: [facility] ORS;  Service: Ophthalmology;  Laterality: Left;  [place] 00:36.0 AP% 16.9 CDE 6.08 Fluid Pack Lot # [id number] [place]  . CESAREAN SECTION  1971  . CHOLECYSTECTOMY  1972  . COLONOSCOPY WITH PROPOFOL N/A 12/24/2017   Procedure: COLONOSCOPY WITH PROPOFOL;  Surgeon: Jonathon Bellows, MD;  Location: [facility] ENDOSCOPY;  Service: Gastroenterology;  Laterality: N/A;  . EYE SURGERY    . retal fistula repair     Dr. Jamal Collin x 2    Family History  Problem Relation Age of Onset  . Diabetes Mother   . Hypertension Mother   . Heart attack Mother   . Heart failure Father   . Heart disease Father   . Diabetes Brother   . Hypertension Brother   . COPD Brother   . Diabetes Sister     Social History   Socioeconomic History  . Marital status: Married    Spouse name: Percell Miller  . Number of children: 2  . Years of education: some college  . Highest education level: 12th grade  Occupational History  . Occupation: Retired    Comment: since 04/2008  Social Needs  . Financial resource strain: Not hard at all  . Food insecurity    Worry: Never true    Inability: Never true  . Transportation needs    Medical: No     Non-medical: No  Tobacco Use  . Smoking status: Former Smoker    Packs/day: 0.50    Years: 5.00    Pack years: 2.50    Types: Cigarettes    Quit date: 12/07/1988    Years since quitting: 30.0  . Smokeless tobacco: Never Used  . Tobacco comment: smoking cessation materials not required  Substance and Sexual Activity  . Alcohol use: No    Alcohol/week: 0.0 standard drinks  . Drug use: No  . Sexual activity: Not Currently    Partners: Male    Birth control/protection: None    Comment: patient's husband  has prostate issues so she cannot with him. has not in 57yr.  Lifestyle  . Physical activity    Days per week: 0 days    Minutes per session: 0 min  . Stress: Not at all  Relationships  . Social connections    Talks on phone: More than three times a week    Gets together: More than three times a week    Attends religious service: More than 4 times per year    Active member of club or organization: Yes    Attends meetings of clubs or organizations: More than 4 times per year    Relationship status: Married  . Intimate partner violence    Fear of current or ex partner: No    Emotionally abused: No    Physically abused: No    Forced sexual activity: No  Other Topics Concern  . Not on file  Social History Narrative   Mother is 960yrold that has dementia, she has a siActuaryut still checks on her       Husband has prostate issues and early dementia, he has been losing his patience and it has been stressful       Patient has 7 deteriorated disc     Current Outpatient Medications:  .  Black Cohosh 540 MG CAPS, Take 1,620 mg by mouth at bedtime. , Disp: , Rfl:  .  Cholecalciferol (VITAMIN D3) 5000 UNITS CAPS, Take 5,000 Units by mouth every other day. , Disp: , Rfl:  .  OVER THE COUNTER MEDICATION, 1 capsule 2 (two) times daily. , Disp: , Rfl:  .  Red Yeast Rice 600 MG CAPS, Take 1,200-1,800 mg by mouth See admin instructions. Take 1800 mg by mouth in the morning and take 1200  mg by mouth at bedtime, Disp: , Rfl:  .  Valerian 500 MG CAPS, Take 250 mg by mouth daily., Disp: , Rfl:   Allergies  Allergen Reactions  . Moxifloxacin Hcl In Nacl Swelling  . Prednisone Other (See Comments)    Paranoid  . Penicillins Rash and Other (See Comments)    Has patient had a PCN reaction causing immediate rash, facial/tongue/throat swelling, SOB or lightheadedness with hypotension: Unknown Has patient had a PCN reaction causing severe rash involving mucus membranes or skin necrosis: No Has patient had a PCN reaction that required hospitalization: Yes Has patient had a PCN reaction occurring within the last 10 years: No If all of the above answers are "NO", then may proceed with Cephalosporin use.      ROS  Constitutional: Negative for fever or weight change.  Respiratory: Negative for cough and shortness of breath.   Cardiovascular: Negative for chest pain or palpitations.  Gastrointestinal: Negative for abdominal pain, no bowel changes.  Musculoskeletal: Negative for gait problem or joint swelling.  Skin: Negative for rash.  Neurological: Negative for dizziness or headache.  No other specific complaints in a complete review of systems (except as listed in HPI above).  Objective  Vitals:   12/14/18 0845  BP: 110/64  Pulse: 63  Resp: 16  Temp: (!) 96.9 F (36.1 C)  TempSrc: Temporal  SpO2: 97%  Weight: 152 lb 8 oz (69.2 kg)  Height: 5' (1.524 m)    Body mass index is 29.78 kg/m.  Physical Exam  Constitutional: Patient appears well-developed and well-nourished. No distress.  HENT: Head: Normocephalic and atraumatic. Ears: B TMs ok, no erythema or effusion; Nose: Nose normal. Mouth/Throat: Oropharynx is clear and moist. No  oropharyngeal exudate.  Eyes: Conjunctivae and EOM are normal. Pupils are equal, round, and reactive to light. No scleral icterus.  Neck: Normal range of motion. Neck supple. No JVD present. No thyromegaly present.  Cardiovascular:  Normal rate, regular rhythm and normal heart sounds.  No murmur heard. No BLE edema. Pulmonary/Chest: Effort normal and breath sounds normal. No respiratory distress. Abdominal: Soft. Bowel sounds are normal, no distension. There is no tenderness. no masses Breast: no lumps or masses, no nipple discharge or rashes FEMALE GENITALIA:  Not done  RECTAL: not done  Musculoskeletal: Normal range of motion, no joint effusions. No gross deformities Neurological: he is alert and oriented to person, place, and time. No cranial nerve deficit. Coordination, balance, strength, speech and gait are normal.  Skin: Skin is warm and dry. No rash noted. No erythema.  Psychiatric: Patient has a normal mood and affect. behavior is normal. Judgment and thought content normal.  Fall Risk: Fall Risk  12/14/2018 12/07/2018 06/24/2018 12/08/2017 12/07/2017  Falls in the past year? 0 0 0 No No  Number falls in past yr: 0 0 0 - -  Injury with Fall? 0 0 0 - -  Risk for fall due to : - - - Impaired vision -  Risk for fall due to: Comment - - - wears eyeglasses -  Follow up - Falls prevention discussed - - -    Functional Status Survey: Is the patient deaf or have difficulty hearing?: No Does the patient have difficulty seeing, even when wearing glasses/contacts?: No Does the patient have difficulty concentrating, remembering, or making decisions?: No Does the patient have difficulty walking or climbing stairs?: No Does the patient have difficulty dressing or bathing?: No Does the patient have difficulty doing errands alone such as visiting a doctor's office or shopping?: No  Assessment & Plan  1. Memory change  - COMPLETE METABOLIC PANEL WITH GFR - Vitamin B12 - TSH  2. Senile purpura (HCC)  Both arms, stable   3. Pure hypercholesterolemia  - Lipid panel  4. Hyperglycemia  - Hemoglobin A1c  5. Well adult exam  - Lipid panel - COMPLETE METABOLIC PANEL WITH GFR - Vitamin B12 - TSH - Hemoglobin  A1c  -USPSTF grade A and B recommendations reviewed with patient; age-appropriate recommendations, preventive care, screening tests, etc discussed and encouraged; healthy living encouraged; see AVS for patient education given to patient -Discussed importance of 150 minutes of physical activity weekly, eat two servings of fish weekly, eat one serving of tree nuts ( cashews, pistachios, pecans, almonds.Marland Kitchen) every other day, eat 6 servings of fruit/vegetables daily and drink plenty of water and avoid sweet beverages.

## 2018-12-15 LAB — COMPLETE METABOLIC PANEL WITH GFR
AG Ratio: 1.4 (calc) (ref 1.0–2.5)
ALT: 9 U/L (ref 6–29)
AST: 14 U/L (ref 10–35)
Albumin: 4.2 g/dL (ref 3.6–5.1)
Alkaline phosphatase (APISO): 35 U/L — ABNORMAL LOW (ref 37–153)
BUN: 17 mg/dL (ref 7–25)
CO2: 30 mmol/L (ref 20–32)
Calcium: 9.8 mg/dL (ref 8.6–10.4)
Chloride: 105 mmol/L (ref 98–110)
Creat: 0.91 mg/dL (ref 0.60–0.93)
GFR, Est African American: 72 mL/min/{1.73_m2} (ref >=60)
GFR, Est Non African American: 62 mL/min/{1.73_m2} (ref >=60)
Globulin: 2.9 g/dL (calc) (ref 1.9–3.7)
Glucose, Bld: 85 mg/dL (ref 65–99)
Potassium: 4.2 mmol/L (ref 3.5–5.3)
Sodium: 141 mmol/L (ref 135–146)
Total Bilirubin: 0.4 mg/dL (ref 0.2–1.2)
Total Protein: 7.1 g/dL (ref 6.1–8.1)

## 2018-12-15 LAB — HEMOGLOBIN A1C
Hgb A1c MFr Bld: 5.4 % of total Hgb (ref <5.7)
Mean Plasma Glucose: 108 (calc)
eAG (mmol/L): 6 (calc)

## 2018-12-15 LAB — TSH: TSH: 1.58 mIU/L (ref 0.40–4.50)

## 2018-12-15 LAB — LIPID PANEL
Cholesterol: 247 mg/dL — ABNORMAL HIGH (ref <200)
HDL: 72 mg/dL (ref >=50)
LDL Cholesterol (Calc): 153 mg/dL (calc) — ABNORMAL HIGH
Non-HDL Cholesterol (Calc): 175 mg/dL (calc) — ABNORMAL HIGH (ref <130)
Total CHOL/HDL Ratio: 3.4 (calc) (ref <5.0)
Triglycerides: 106 mg/dL (ref <150)

## 2018-12-15 LAB — VITAMIN B12: Vitamin B-12: 279 pg/mL (ref 200–1100)

## 2018-12-16 ENCOUNTER — Telehealth: Payer: Self-pay | Admitting: Family Medicine

## 2018-12-16 DIAGNOSIS — E7800 Pure hypercholesterolemia, unspecified: Secondary | ICD-10-CM

## 2018-12-16 DIAGNOSIS — R739 Hyperglycemia, unspecified: Secondary | ICD-10-CM

## 2018-12-16 NOTE — Telephone Encounter (Signed)
Pt is purchasing B12 and she sees one that is 540mcg high potency and the other 1024mcg time release and they are both rexall / which one should she get

## 2018-12-17 NOTE — Addendum Note (Signed)
Addended by: Inda Coke on: 12/17/2018 02:12 PM   Modules accepted: Orders

## 2018-12-17 NOTE — Telephone Encounter (Signed)
Pt called back in to be advised.   Please assist.

## 2018-12-17 NOTE — Telephone Encounter (Signed)
Left a message informing the patient to speak with the pharmacist regarding recalls on B12 SL.

## 2018-12-21 NOTE — Telephone Encounter (Signed)
Patient is requesting how much b12 pills to take.   Patient is requesting pcp nurse to call back, Patient is upset that no one has called her back.   Patient call back  [phone]

## 2018-12-21 NOTE — Telephone Encounter (Signed)
Spoke with patient and informed her per Dr. Ancil Boozer to take B12 1000 mcg SL daily or injections monthly. Patient states she will take the otc supplement. Also wanted to see if she could talk to [facility] or Chronic Care Management about foods and making a plan out to control her elevated cholesterol.

## 2018-12-23 ENCOUNTER — Encounter: Payer: Self-pay | Admitting: *Deleted

## 2018-12-23 ENCOUNTER — Ambulatory Visit: Payer: Self-pay | Admitting: *Deleted

## 2018-12-23 DIAGNOSIS — E7800 Pure hypercholesterolemia, unspecified: Secondary | ICD-10-CM

## 2018-12-23 DIAGNOSIS — Z638 Other specified problems related to primary support group: Secondary | ICD-10-CM

## 2018-12-23 NOTE — Chronic Care Management (AMB) (Signed)
  Chronic Care Management    Clinical Social Work General Note  12/23/2018 Name: Abigail Hatfield MRN: [id number] DOB: Aug 04, 1945   Patient  is a 73 year old femalewho sees Abigail Sizer, MD for primary care. Dr. Lenox Hatfield the CCM team to consult the patient for consult on foods and making a plan out to control her elevated cholesterol. Referral was placed 12/16/18. Patient's last office visit was 12/14/18.    Abigail Hatfield was given information about Chronic Care Management services today including:  1. CCM service includes personalized support from designated clinical staff supervised by her physician, including individualized plan of care and coordination with other care providers 2. 24/7 contact phone numbers for assistance for urgent and routine care needs. 3. Service will only be billed when office clinical staff spend 20 minutes or more in a month to coordinate care. 4. Only one practitioner may furnish and bill the service in a calendar month. 5. The patient may stop CCM services at any time (effective at the end of the month) by phone call to the office staff. 6. The patient will be responsible for cost sharing (co-pay) of up to 20% of the service fee (after annual deductible is met).  Patient agreed to services and verbal consent obtained.   Review of patient status, including review of consultants reports, relevant laboratory and other test results, and collaboration with appropriate care team members and the patient's provider was performed as part of comprehensive patient evaluation and provision of chronic care management services.    SDOH (Social Determinants of Health) screening performed today. See Care Plan Entry related to challenges with: Stress   Patient discussed stress concerns related to care involved in taking care of her 35 year old mother. Per patient, her mother does have a caregiver which helps, however she will be returning home for about 3 weeks. Patient will  have to come up with a plan to care for her mother during that time. Patient acknowledges the stress but reports that it is to be expected due to her mother's care needs and addiitonal responsibilities. Patient declined assistance with managing her stress but would like assistance with meal ideas in terms of what she can and cannot eat or limit.  Outpatient Encounter Medications as of 12/23/2018  Medication Sig  . Black Cohosh 540 MG CAPS Take 1,620 mg by mouth at bedtime.   . Cholecalciferol (VITAMIN D3) 5000 UNITS CAPS Take 5,000 Units by mouth every other day.   Marland Kitchen OVER THE COUNTER MEDICATION 1 capsule 2 (two) times daily.   . Red Yeast Rice 600 MG CAPS Take 1,200-1,800 mg by mouth See admin instructions. Take 1800 mg by mouth in the morning and take 1200 mg by mouth at bedtime  . Valerian 500 MG CAPS Take 250 mg by mouth daily.   No facility-administered encounter medications on file as of 12/23/2018.     Goals Addressed   None      Follow Up Plan: Appointment scheduled for CCM pharmacist on  follow up with client by phone on: 12/29/18       Abigail Hatfield, [place] Worker  [facility] Care Management [phone]

## 2018-12-23 NOTE — Chronic Care Management (AMB) (Signed)
   Chronic Care Management   Unsuccessful Call Note 12/23/2018 Name: Abigail Hatfield MRN: [id number] DOB: 1945-05-30  Patient  is a 72 year old female who sees Steele Sizer, MD for primary care. Dr. Ancil Boozer asked the CCM team to consult the patient for consult on foods and making a plan out to control her elevated cholesterol.  Referral was placed 12/16/18 . Patient's last office visit was 12/14/18.     This social worker was unable to reach patient via telephone today for consent for CCM services. I have left HIPAA compliant voicemail asking patient to return my call. (unsuccessful outreach #1).   Plan: Will follow-up within 7 business days via telephone.     Elliot Gurney, [place] Administrator[facility] [phone]

## 2018-12-23 NOTE — Patient Instructions (Addendum)
Thank you allowing the Chronic Care Management Team to be a part of your care! It was a pleasure speaking with you today!  Abigail Hatfield was given information about Chronic Care Management services today including:  1. CCM service includes personalized support from designated clinical staff supervised by her physician, including individualized plan of care and coordination with other care providers 2. 24/7 contact phone numbers for assistance for urgent and routine care needs. 3. Service will only be billed when office clinical staff spend 20 minutes or more in a month to coordinate care. 4. Only one practitioner may furnish and bill the service in a calendar month. 5. The patient may stop CCM services at any time (effective at the end of the month) by phone call to the office staff. 6. The patient will be responsible for cost sharing (co-pay) of up to 20% of the service fee (after annual deductible is met).     Marland Kitchen CCM (Chronic Care Management) Team     Ruben Reason PharmD  Clinical Pharmacist  [phone]   [place], LCSW Clinical Social Worker [phone]  Goals Addressed   None      The patient verbalized understanding of instructions provided today and declined a print copy of patient instruction materials.   Telephone follow up appointment with care management team member scheduled for: 12/29/18

## 2018-12-27 ENCOUNTER — Telehealth: Payer: Self-pay | Admitting: Family Medicine

## 2018-12-27 NOTE — Telephone Encounter (Signed)
Patient is calling to let Dr. Ancil Boozer know that she can not find Sublingual B-12 1000.   Patient has tried 6 places she is tired of looking.  What is the difference between time release and Sublingual?   What does Dr. Ancil Boozer want the patient to get 1000 time release.  Please advise [phone]

## 2018-12-29 ENCOUNTER — Ambulatory Visit: Payer: Self-pay | Admitting: Pharmacist

## 2018-12-29 ENCOUNTER — Telehealth: Payer: Self-pay

## 2018-12-29 DIAGNOSIS — E7800 Pure hypercholesterolemia, unspecified: Secondary | ICD-10-CM

## 2018-12-29 DIAGNOSIS — Z638 Other specified problems related to primary support group: Secondary | ICD-10-CM

## 2018-12-29 DIAGNOSIS — R739 Hyperglycemia, unspecified: Secondary | ICD-10-CM

## 2018-12-29 NOTE — Chronic Care Management (AMB) (Signed)
  Chronic Care Management   Note  12/29/2018 Name: Abigail Hatfield MRN: [id number] DOB: 1946/04/05  Incoming call from patient, HIPAA identifiers verified.  Patient wondering how long appointment would take, she has to work today (works one day a week) and is supposed to be at work at 15. Of note, this is when patient appointment was scheduled. Patient scheduled her own appointment, it appears she has scheduled it for the one time that she is unavailable.   Rescheduled patient for an appointment that works better for her schedule.  Follow up plan: Telephone follow up appointment with care management team member scheduled for: 11am on Friday, September 18.  Ruben Reason, PharmD Clinical Pharmacist [place] [phone]

## 2018-12-29 NOTE — Telephone Encounter (Signed)
Patient notified to try SL Vitamin B12 500 iu twice daily and try the vitamin stores. She states she does not go to the vitamin stores, only Agricultural consultant.

## 2018-12-31 ENCOUNTER — Ambulatory Visit (INDEPENDENT_AMBULATORY_CARE_PROVIDER_SITE_OTHER): Payer: PPO | Admitting: Pharmacist

## 2018-12-31 DIAGNOSIS — E7800 Pure hypercholesterolemia, unspecified: Secondary | ICD-10-CM | POA: Diagnosis not present

## 2018-12-31 DIAGNOSIS — R739 Hyperglycemia, unspecified: Secondary | ICD-10-CM

## 2018-12-31 NOTE — Patient Instructions (Signed)
  Thank you allowing the Chronic Care Management Team to be a part of your care!   Goals Addressed            This Visit's Progress   . Lower cholesterol (pt-stated)       Current Barriers:  Marland Kitchen Knowledge Deficits related to basic understanding of hyperlipidemia pathophysiology and self care management . Knowledge Deficits related to understanding of medications prescribed for management of hyperlipidemia  Clinical Pharmacist Goal(s):  Marland Kitchen Over the next 30 days, patient will verbalize understanding of plan for cholesterol management, through diet, exercise, and prescription medication management (statin)  Interventions:  . Counseling on pathophysiology of high cholesterol . Counseling on diet and exercise and the effects on cholesterol . Education on the importance of statins and the benefits of statin over red yeast rice   Patient Self Care Activities:  . Calls provider office for new concerns or questions  Initial goal documentation

## 2018-12-31 NOTE — Chronic Care Management (AMB) (Signed)
Chronic Care Management   Note  12/31/2018 Name: CAROLEANN GERTZ MRN: [id number] DOB: 01-26-1946  Subjective:   Mrs Nazia Haze is a 73 year old patient of Dr. Steele Sizer referred to [facility] clinical pharmacist for medication management- cholesterol. Initial pharmacy outreach today, HIPAA identifiers verified.  Ms. Euresti is very outspoken about not wanting to take medication today. Her main barriers are a desire to not gain weight, a side effect that she has "heard happens a lot". And she does not want the pills to cost "$30" when her supplements are only a couple dollars at [facility].   Objective: Lab Results  Component Value Date   CREATININE 0.91 12/14/2018   CREATININE 0.82 06/06/2018   CREATININE 0.81 11/26/2016    Lab Results  Component Value Date   HGBA1C 5.4 12/14/2018    Lipid Panel     Component Value Date/Time   CHOL 247 (H) 12/14/2018 0932   CHOL 173 12/21/2014 0958   TRIG 106 12/14/2018 0932   HDL 72 12/14/2018 0932   HDL 63 12/21/2014 0958   CHOLHDL 3.4 12/14/2018 0932   VLDL 20 11/26/2016 0808   LDLCALC 153 (H) 12/14/2018 0932    BP Readings from Last 3 Encounters:  12/14/18 110/64  06/24/18 122/70  06/07/18 (!) 96/50    Allergies  Allergen Reactions  . Moxifloxacin Hcl In Nacl Swelling  . Prednisone Other (See Comments)    Paranoid  . Penicillins Rash and Other (See Comments)    Has patient had a PCN reaction causing immediate rash, facial/tongue/throat swelling, SOB or lightheadedness with hypotension: Unknown Has patient had a PCN reaction causing severe rash involving mucus membranes or skin necrosis: No Has patient had a PCN reaction that required hospitalization: Yes Has patient had a PCN reaction occurring within the last 10 years: No If all of the above answers are "NO", then may proceed with Cephalosporin use.     Medications Reviewed Today    Reviewed by Steele Sizer, MD (Physician) on 12/14/18 at [phone]  Med List Status:  <None>  Medication Order Taking? Sig Documenting Provider Last Dose Status Informant  Black Cohosh 540 MG CAPS [id number] Yes Take 1,620 mg by mouth at bedtime.  [provider] Taking Active Self           Med Note Modena Nunnery, Ivar Drape   Fri Jun 12, 2017  1:30 PM)    Cholecalciferol (VITAMIN D3) 5000 UNITS CAPS [id number] Yes Take 5,000 Units by mouth every other day.  [provider] Taking Active Self           Med Note Modena Nunnery, Ivar Drape   Fri Jun 12, 2017  1:30 PM)    OVER THE COUNTER MEDICATION [id number] Yes 1 capsule 2 (two) times daily.  [provider] Taking Active   Red Yeast Rice 600 MG CAPS [id number] Yes Take 1,200-1,800 mg by mouth See admin instructions. Take 1800 mg by mouth in the morning and take 1200 mg by mouth at bedtime [provider] Taking Active Self  Valerian 500 MG CAPS [id number] Yes Take 250 mg by mouth daily. [provider] Taking Active            Assessment:    Intervention  YES NO  Explanation  Needs additional therapy      Medication requires monitoring []  []    Preventative therapy   [x]  []  Statin- TC>200, ASCVD = 11.2%   Other pertinent pharmacist  counseling      Goals  Addressed            This Visit's Progress   . Lower cholesterol (pt-stated)       Current Barriers:  Marland Kitchen Knowledge Deficits related to basic understanding of hyperlipidemia pathophysiology and self care management . Knowledge Deficits related to understanding of medications prescribed for management of hyperlipidemia  Clinical Pharmacist Goal(s):  Marland Kitchen Over the next 30 days, patient will verbalize understanding of plan for cholesterol management, through diet, exercise, and prescription medication management (statin)  Interventions:  . Counseling on pathophysiology of high cholesterol . Counseling on diet and exercise and the effects on cholesterol . Education on the importance of statins and the benefits of statin over red yeast rice    Patient Self Care Activities:  . Calls provider office for new concerns or questions  Initial goal documentation         Plan: Recommendations discussed with provider - simvastatin 40mg    - PharmD to investigate cash price at [facility]; simvastatin is Tier 1 on [employer] health plan   Follow up: Telephone follow up appointment with care management team member scheduled for: 2 weeks with PharmD  Ruben Reason, PharmD Clinical Pharmacist [place] [phone]

## 2019-01-02 ENCOUNTER — Other Ambulatory Visit: Payer: Self-pay | Admitting: Family Medicine

## 2019-01-02 MED ORDER — SIMVASTATIN 20 MG PO TABS: 20.0000 mg | ORAL_TABLET | Freq: Every day | ORAL | 0 refills | Status: DC

## 2019-01-04 ENCOUNTER — Telehealth: Payer: Self-pay

## 2019-01-04 NOTE — Telephone Encounter (Signed)
Spoke with patient and informed her Dr. Ancil Boozer sent in Simvastatin to [facility] for 90 day prescription. She states she spoke with Almyra Free regarding the cholesterol medication. She also only wants to pick up a 30 day prescription from [facility] to see how she tolerates the medication first FYI.

## 2019-01-04 NOTE — Telephone Encounter (Signed)
Copied from [place] [phone]. Topic: General - Other >> Jan 04, 2019  8:41 AM Keene Breath wrote: Reason for CRM: Patient called to check on the status of a medication for cholesterol that she said the nurse was going to ask the doctor about.  The patient still has not heard anything yet and would like to speak with the nurse and get the status.  Please advise and call patient back at [phone]

## 2019-01-04 NOTE — Telephone Encounter (Signed)
Copied from [place] [phone]. Topic: General - Other >> Jan 04, 2019  8:41 AM Keene Breath wrote: Reason for CRM: Patient called to check on the status of a medication for cholesterol that she said the nurse was going to ask the doctor about.  The patient still has not heard anything yet and would like to speak with the nurse and get the status.  Please advise and call patient back at [phone]

## 2019-01-05 DIAGNOSIS — M9901 Segmental and somatic dysfunction of cervical region: Secondary | ICD-10-CM | POA: Diagnosis not present

## 2019-01-05 DIAGNOSIS — M5033 Other cervical disc degeneration, cervicothoracic region: Secondary | ICD-10-CM | POA: Diagnosis not present

## 2019-01-05 DIAGNOSIS — M9903 Segmental and somatic dysfunction of lumbar region: Secondary | ICD-10-CM | POA: Diagnosis not present

## 2019-01-05 DIAGNOSIS — M722 Plantar fascial fibromatosis: Secondary | ICD-10-CM | POA: Diagnosis not present

## 2019-01-05 DIAGNOSIS — M62830 Muscle spasm of back: Secondary | ICD-10-CM | POA: Diagnosis not present

## 2019-01-17 ENCOUNTER — Telehealth: Payer: Self-pay

## 2019-01-17 NOTE — Telephone Encounter (Signed)
Patient called.  Patient aware.  

## 2019-01-17 NOTE — Telephone Encounter (Signed)
Copied from [place] [phone]. Topic: General - Other >> Jan 17, 2019 10:08 AM Rainey Pines A wrote: Patient would like a callback from nurse in regards to if she should still be taking the red yeast rice along with her simvastatin

## 2019-01-24 ENCOUNTER — Ambulatory Visit
Admission: RE | Admit: 2019-01-24 | Discharge: 2019-01-24 | Disposition: A | Discharge status: Home / Self Care | Payer: PPO | Source: Ambulatory Visit | Attending: Family Medicine | Admitting: Family Medicine

## 2019-01-24 DIAGNOSIS — Z1231 Encounter for screening mammogram for malignant neoplasm of breast: Secondary | ICD-10-CM

## 2019-02-01 ENCOUNTER — Telehealth: Payer: Self-pay | Admitting: Family Medicine

## 2019-02-01 NOTE — Telephone Encounter (Signed)
Pt states she was advised to do 3 things to bring her calcium up. She can only remember one - that was to drink milk.  Would like a call back to advise what the other is.

## 2019-02-02 DIAGNOSIS — M62830 Muscle spasm of back: Secondary | ICD-10-CM | POA: Diagnosis not present

## 2019-02-02 DIAGNOSIS — M9903 Segmental and somatic dysfunction of lumbar region: Secondary | ICD-10-CM | POA: Diagnosis not present

## 2019-02-02 DIAGNOSIS — M9901 Segmental and somatic dysfunction of cervical region: Secondary | ICD-10-CM | POA: Diagnosis not present

## 2019-02-02 DIAGNOSIS — M5033 Other cervical disc degeneration, cervicothoracic region: Secondary | ICD-10-CM | POA: Diagnosis not present

## 2019-02-02 NOTE — Telephone Encounter (Signed)
Patient notified about foods high in calcium such as milk, cheese, yogurt, tofu.

## 2019-03-02 DIAGNOSIS — M62830 Muscle spasm of back: Secondary | ICD-10-CM | POA: Diagnosis not present

## 2019-03-02 DIAGNOSIS — M9903 Segmental and somatic dysfunction of lumbar region: Secondary | ICD-10-CM | POA: Diagnosis not present

## 2019-03-02 DIAGNOSIS — M5033 Other cervical disc degeneration, cervicothoracic region: Secondary | ICD-10-CM | POA: Diagnosis not present

## 2019-03-02 DIAGNOSIS — M9901 Segmental and somatic dysfunction of cervical region: Secondary | ICD-10-CM | POA: Diagnosis not present

## 2019-03-03 ENCOUNTER — Other Ambulatory Visit: Payer: Self-pay | Admitting: Family Medicine

## 2019-03-16 DIAGNOSIS — M9903 Segmental and somatic dysfunction of lumbar region: Secondary | ICD-10-CM | POA: Diagnosis not present

## 2019-03-16 DIAGNOSIS — M9901 Segmental and somatic dysfunction of cervical region: Secondary | ICD-10-CM | POA: Diagnosis not present

## 2019-03-16 DIAGNOSIS — M5033 Other cervical disc degeneration, cervicothoracic region: Secondary | ICD-10-CM | POA: Diagnosis not present

## 2019-03-16 DIAGNOSIS — M62830 Muscle spasm of back: Secondary | ICD-10-CM | POA: Diagnosis not present

## 2019-03-21 DIAGNOSIS — M9903 Segmental and somatic dysfunction of lumbar region: Secondary | ICD-10-CM | POA: Diagnosis not present

## 2019-03-21 DIAGNOSIS — M5033 Other cervical disc degeneration, cervicothoracic region: Secondary | ICD-10-CM | POA: Diagnosis not present

## 2019-03-21 DIAGNOSIS — M62830 Muscle spasm of back: Secondary | ICD-10-CM | POA: Diagnosis not present

## 2019-03-21 DIAGNOSIS — M9901 Segmental and somatic dysfunction of cervical region: Secondary | ICD-10-CM | POA: Diagnosis not present

## 2019-04-05 DIAGNOSIS — M9901 Segmental and somatic dysfunction of cervical region: Secondary | ICD-10-CM | POA: Diagnosis not present

## 2019-04-05 DIAGNOSIS — M9903 Segmental and somatic dysfunction of lumbar region: Secondary | ICD-10-CM | POA: Diagnosis not present

## 2019-04-05 DIAGNOSIS — M5033 Other cervical disc degeneration, cervicothoracic region: Secondary | ICD-10-CM | POA: Diagnosis not present

## 2019-04-05 DIAGNOSIS — M62830 Muscle spasm of back: Secondary | ICD-10-CM | POA: Diagnosis not present

## 2019-04-13 DIAGNOSIS — M9901 Segmental and somatic dysfunction of cervical region: Secondary | ICD-10-CM | POA: Diagnosis not present

## 2019-04-13 DIAGNOSIS — M62830 Muscle spasm of back: Secondary | ICD-10-CM | POA: Diagnosis not present

## 2019-04-13 DIAGNOSIS — M9903 Segmental and somatic dysfunction of lumbar region: Secondary | ICD-10-CM | POA: Diagnosis not present

## 2019-04-13 DIAGNOSIS — M5033 Other cervical disc degeneration, cervicothoracic region: Secondary | ICD-10-CM | POA: Diagnosis not present

## 2019-05-08 DIAGNOSIS — U071 COVID-19: Secondary | ICD-10-CM | POA: Diagnosis not present

## 2019-05-08 DIAGNOSIS — Z20822 Contact with and (suspected) exposure to covid-19: Secondary | ICD-10-CM | POA: Diagnosis not present

## 2019-05-08 DIAGNOSIS — Z03818 Encounter for observation for suspected exposure to other biological agents ruled out: Secondary | ICD-10-CM | POA: Diagnosis not present

## 2019-05-08 DIAGNOSIS — Z20828 Contact with and (suspected) exposure to other viral communicable diseases: Secondary | ICD-10-CM | POA: Diagnosis not present

## 2019-05-12 ENCOUNTER — Telehealth: Payer: Self-pay | Admitting: Family Medicine

## 2019-05-12 ENCOUNTER — Ambulatory Visit: Payer: Self-pay | Admitting: *Deleted

## 2019-05-12 NOTE — Telephone Encounter (Signed)
Patient is calling to report that she tested positive for COVID. Patient reports no fever, no congestion, Patient does report fatigue. Patient spoke with nurse  And she is wanting to know she wait 10 days or 14 days before going out? Please advise CB- [phone]

## 2019-05-12 NOTE — Telephone Encounter (Signed)
Contacted pt regarding COVID quarantine; pt given guidelines:   All persons with fever and respiratory symptoms should isolate themselves until ALL conditions listed below are met: - at least 10 days since symptoms onset - AND 3 consecutive days fever free without antipyretics (acetaminophen [Tylenol] or ibuprofen [Advil]) - AND improvement in respiratory symptoms Pt advised to contact her PCP for furhter recommendations; she verbalized understanding; the pt sees Dr Ancil Boozer, [facility]; will route to office for notification.  Reason for Disposition . General information question, no triage required and triager able to answer question  Answer Assessment - Initial Assessment Questions 1. REASON FOR CALL or QUESTION: "What is your reason for calling today?" or "How can I best help you?" or "What question do you have that I can help answer?"     quarantine guidelines  Protocols used: INFORMATION ONLY CALL - NO TRIAGE-A-AH

## 2019-05-13 NOTE — Telephone Encounter (Signed)
Called patient. Patient informed 10 days was good.

## 2019-05-25 DIAGNOSIS — M9901 Segmental and somatic dysfunction of cervical region: Secondary | ICD-10-CM | POA: Diagnosis not present

## 2019-05-25 DIAGNOSIS — M62830 Muscle spasm of back: Secondary | ICD-10-CM | POA: Diagnosis not present

## 2019-05-25 DIAGNOSIS — M9903 Segmental and somatic dysfunction of lumbar region: Secondary | ICD-10-CM | POA: Diagnosis not present

## 2019-05-25 DIAGNOSIS — M5033 Other cervical disc degeneration, cervicothoracic region: Secondary | ICD-10-CM | POA: Diagnosis not present

## 2019-05-25 DIAGNOSIS — Z961 Presence of intraocular lens: Secondary | ICD-10-CM | POA: Diagnosis not present

## 2019-06-01 DIAGNOSIS — M9903 Segmental and somatic dysfunction of lumbar region: Secondary | ICD-10-CM | POA: Diagnosis not present

## 2019-06-01 DIAGNOSIS — M5033 Other cervical disc degeneration, cervicothoracic region: Secondary | ICD-10-CM | POA: Diagnosis not present

## 2019-06-01 DIAGNOSIS — M9901 Segmental and somatic dysfunction of cervical region: Secondary | ICD-10-CM | POA: Diagnosis not present

## 2019-06-01 DIAGNOSIS — M62830 Muscle spasm of back: Secondary | ICD-10-CM | POA: Diagnosis not present

## 2019-06-22 DIAGNOSIS — M9901 Segmental and somatic dysfunction of cervical region: Secondary | ICD-10-CM | POA: Diagnosis not present

## 2019-06-22 DIAGNOSIS — M5033 Other cervical disc degeneration, cervicothoracic region: Secondary | ICD-10-CM | POA: Diagnosis not present

## 2019-06-22 DIAGNOSIS — M9903 Segmental and somatic dysfunction of lumbar region: Secondary | ICD-10-CM | POA: Diagnosis not present

## 2019-06-22 DIAGNOSIS — M62830 Muscle spasm of back: Secondary | ICD-10-CM | POA: Diagnosis not present

## 2019-07-01 ENCOUNTER — Other Ambulatory Visit: Payer: Self-pay | Admitting: Family Medicine

## 2019-07-01 NOTE — Telephone Encounter (Signed)
Requested Prescriptions  Pending Prescriptions Disp Refills  . simvastatin (ZOCOR) 20 MG tablet [Pharmacy Med Name: SIMVASTATIN 20 MG TAB] 90 tablet 0    Sig: TAKE ONE TABLET BY MOUTH AT BEDTIME     Cardiovascular:  Antilipid - Statins Failed - 07/01/2019  9:30 AM      Failed - Total Cholesterol in normal range and within 360 days    Cholesterol, Total  Date Value Ref Range Status  12/21/2014 173 100 - 199 mg/dL Final   Cholesterol  Date Value Ref Range Status  12/14/2018 247 (H) <200 mg/dL Final         Failed - LDL in normal range and within 360 days    LDL Cholesterol (Calc)  Date Value Ref Range Status  12/14/2018 153 (H) mg/dL (calc) Final    Comment:    Reference range: <100 . Desirable range <100 mg/dL for primary prevention;   <70 mg/dL for patients with CHD or diabetic patients  with > or = 2 CHD risk factors. Marland Kitchen LDL-C is now calculated using the Martin-Hopkins  calculation, which is a validated novel method providing  better accuracy than the Friedewald equation in the  estimation of LDL-C.  Cresenciano Genre et al. Annamaria Helling. [id number]): 2061-2068  (http://education.[employer].com/faq/FAQ164)          Passed - HDL in normal range and within 360 days    HDL  Date Value Ref Range Status  12/14/2018 72 > OR = 50 mg/dL Final  12/21/2014 63 >39 mg/dL Final    Comment:    According to ATP-III Guidelines, HDL-C >59 mg/dL is considered a negative risk factor for CHD.          Passed - Triglycerides in normal range and within 360 days    Triglycerides  Date Value Ref Range Status  12/14/2018 106 <150 mg/dL Final         Passed - Patient is not pregnant      Passed - Valid encounter within last 12 months    Recent Outpatient Visits          6 months ago Memory change   [facility] Steele Sizer, MD   1 year ago Episode of memory loss   [place], [place], [place]   1 year ago Well woman exam   [facility] Steele Sizer, MD   2 years ago Screening for breast cancer   [place], [place]   3 years ago Screening for breast cancer   [place], [place]      Future Appointments            In 5 months  [facility], [place]   In 5 months Steele Sizer, MD [facility]

## 2019-07-05 DIAGNOSIS — M5033 Other cervical disc degeneration, cervicothoracic region: Secondary | ICD-10-CM | POA: Diagnosis not present

## 2019-07-05 DIAGNOSIS — M9901 Segmental and somatic dysfunction of cervical region: Secondary | ICD-10-CM | POA: Diagnosis not present

## 2019-07-05 DIAGNOSIS — M62830 Muscle spasm of back: Secondary | ICD-10-CM | POA: Diagnosis not present

## 2019-07-05 DIAGNOSIS — M9903 Segmental and somatic dysfunction of lumbar region: Secondary | ICD-10-CM | POA: Diagnosis not present

## 2019-07-20 DIAGNOSIS — M62830 Muscle spasm of back: Secondary | ICD-10-CM | POA: Diagnosis not present

## 2019-07-20 DIAGNOSIS — M9901 Segmental and somatic dysfunction of cervical region: Secondary | ICD-10-CM | POA: Diagnosis not present

## 2019-07-20 DIAGNOSIS — M5033 Other cervical disc degeneration, cervicothoracic region: Secondary | ICD-10-CM | POA: Diagnosis not present

## 2019-07-20 DIAGNOSIS — M9903 Segmental and somatic dysfunction of lumbar region: Secondary | ICD-10-CM | POA: Diagnosis not present

## 2019-07-23 DIAGNOSIS — B373 Candidiasis of vulva and vagina: Secondary | ICD-10-CM | POA: Diagnosis not present

## 2019-07-23 DIAGNOSIS — A499 Bacterial infection, unspecified: Secondary | ICD-10-CM | POA: Diagnosis not present

## 2019-07-23 DIAGNOSIS — R300 Dysuria: Secondary | ICD-10-CM | POA: Diagnosis not present

## 2019-07-23 DIAGNOSIS — N390 Urinary tract infection, site not specified: Secondary | ICD-10-CM | POA: Diagnosis not present

## 2019-07-30 DIAGNOSIS — N390 Urinary tract infection, site not specified: Secondary | ICD-10-CM | POA: Diagnosis not present

## 2019-07-30 DIAGNOSIS — A499 Bacterial infection, unspecified: Secondary | ICD-10-CM | POA: Diagnosis not present

## 2019-08-10 ENCOUNTER — Other Ambulatory Visit: Payer: Self-pay

## 2019-08-10 ENCOUNTER — Ambulatory Visit (INDEPENDENT_AMBULATORY_CARE_PROVIDER_SITE_OTHER): Payer: PPO | Admitting: Family Medicine

## 2019-08-10 ENCOUNTER — Encounter: Payer: Self-pay | Admitting: Family Medicine

## 2019-08-10 VITALS — BP 120/70 | HR 77 | Temp 97.3°F | Resp 16 | Ht 61.0 in | Wt 156.8 lb

## 2019-08-10 DIAGNOSIS — N952 Postmenopausal atrophic vaginitis: Secondary | ICD-10-CM

## 2019-08-10 DIAGNOSIS — R319 Hematuria, unspecified: Secondary | ICD-10-CM

## 2019-08-10 DIAGNOSIS — E7800 Pure hypercholesterolemia, unspecified: Secondary | ICD-10-CM

## 2019-08-10 DIAGNOSIS — E538 Deficiency of other specified B group vitamins: Secondary | ICD-10-CM | POA: Diagnosis not present

## 2019-08-10 DIAGNOSIS — Z79899 Other long term (current) drug therapy: Secondary | ICD-10-CM | POA: Diagnosis not present

## 2019-08-10 DIAGNOSIS — D692 Other nonthrombocytopenic purpura: Secondary | ICD-10-CM | POA: Diagnosis not present

## 2019-08-10 MED ORDER — PREMARIN 0.625 MG/GM VA CREA: 1.0000 | TOPICAL_CREAM | Freq: Every day | VAGINAL | 12 refills | Status: DC

## 2019-08-10 NOTE — Progress Notes (Signed)
Name: Abigail Hatfield   MRN: [id number]    DOB: October 28, 1945   Date:08/10/2019       Progress Note  Subjective  Chief Complaint  Chief Complaint  Patient presents with  . Hematuria    Went to [facility] had a UTI on 07/23/2019, however, during the repeat of  her UA check she still has blood in her urine. Urology referral?     HPI  Hematuria: she went to Urgent Care at [facility] 04/10 for dysuria and frequency, she went back to recheck urine and still had blood in urine and was advised to come in for evaluation. She took antibiotics and dysuria resolved , she still has urinary frequency She denies fever, chills , back pain and is s/p hysterectomy . She has never been on HRT's  Senile purpura: stable on both arms  Dyslipidemia: she is still on simvastatin , denies myalgias, we will recheck labs   Low B12: taking B12 supplementation otc   Plantar Fascitis left foot: doing home exercises, changed her shoes, under the care of Dr. Elvina Mattes, symptoms gradually improving    Patient Active Problem List   Diagnosis Date Noted  . Obesity (BMI 30.0-34.9) 12/07/2017  . Degenerative joint disease of low back 12/07/2017  . Senile purpura ([place]) 12/07/2017  . Hyperlipidemia 12/21/2014    Past Surgical History:  Procedure Laterality Date  . ABDOMINAL HYSTERECTOMY  1980  . CATARACT EXTRACTION W/PHACO Right 06/24/2017   Procedure: CATARACT EXTRACTION PHACO AND INTRAOCULAR LENS PLACEMENT (IOC);  Surgeon: Birder Robson, MD;  Location: [facility] ORS;  Service: Ophthalmology;  Laterality: Right;  [place] 00:48.0 AP% 16.5 CDE 7.92 Fluid Pack Lot # [id number]  . CATARACT EXTRACTION W/PHACO Left 07/14/2017   Procedure: CATARACT EXTRACTION PHACO AND INTRAOCULAR LENS PLACEMENT (IOC);  Surgeon: Birder Robson, MD;  Location: [facility] ORS;  Service: Ophthalmology;  Laterality: Left;  [place] 00:36.0 AP% 16.9 CDE 6.08 Fluid Pack Lot # [id number] [place]  . CESAREAN SECTION  1971  .  CHOLECYSTECTOMY  1972  . COLONOSCOPY WITH PROPOFOL N/A 12/24/2017   Procedure: COLONOSCOPY WITH PROPOFOL;  Surgeon: Jonathon Bellows, MD;  Location: [facility] ENDOSCOPY;  Service: Gastroenterology;  Laterality: N/A;  . EYE SURGERY    . retal fistula repair     Dr. Jamal Collin x 2    Family History  Problem Relation Age of Onset  . Diabetes Mother   . Hypertension Mother   . Heart attack Mother   . Heart failure Father   . Heart disease Father   . Diabetes Brother   . Hypertension Brother   . COPD Brother   . Diabetes Sister   . Breast cancer Neg Hx     Social History   Tobacco Use  . Smoking status: Former Smoker    Packs/day: 0.50    Years: 5.00    Pack years: 2.50    Types: Cigarettes    Quit date: 12/07/1988    Years since quitting: 30.6  . Smokeless tobacco: Never Used  . Tobacco comment: smoking cessation materials not required  Substance Use Topics  . Alcohol use: No    Alcohol/week: 0.0 standard drinks     Current Outpatient Medications:  .  Cholecalciferol (VITAMIN D3) 5000 UNITS CAPS, Take 5,000 Units by mouth every other day. , Disp: , Rfl:  .  Evening Primrose Oil 1000 MG CAPS, Take 3 capsules by mouth daily., Disp: , Rfl:  .  OVER THE COUNTER MEDICATION, 1 capsule 2 (two)  times daily. , Disp: , Rfl:  .  pyridoxine (B-6) 100 MG tablet, Take 100 mg by mouth daily., Disp: , Rfl:  .  simvastatin (ZOCOR) 20 MG tablet, TAKE ONE TABLET BY MOUTH AT BEDTIME, Disp: 90 tablet, Rfl: 0 .  vitamin B-12 (CYANOCOBALAMIN) 1000 MCG tablet, Take 1,000 mcg by mouth daily., Disp: , Rfl:  .  Black Cohosh 540 MG CAPS, Take 1,620 mg by mouth at bedtime. , Disp: , Rfl:  .  Red Yeast Rice 600 MG CAPS, Take 1,200-1,800 mg by mouth See admin instructions. Take 1800 mg by mouth in the morning and take 1200 mg by mouth at bedtime, Disp: , Rfl:  .  Valerian 500 MG CAPS, Take 1,000 mg by mouth daily. , Disp: , Rfl:   Allergies  Allergen Reactions  . Moxifloxacin Hcl In Nacl Swelling  . Prednisone  Other (See Comments)    Paranoid  . Penicillins Rash and Other (See Comments)    Has patient had a PCN reaction causing immediate rash, facial/tongue/throat swelling, SOB or lightheadedness with hypotension: Unknown Has patient had a PCN reaction causing severe rash involving mucus membranes or skin necrosis: No Has patient had a PCN reaction that required hospitalization: Yes Has patient had a PCN reaction occurring within the last 10 years: No If all of the above answers are "NO", then may proceed with Cephalosporin use.     I personally reviewed active problem list, medication list, allergies, family history, social history with the patient/caregiver today.   ROS  Constitutional: Negative for fever or weight change.  Respiratory: Negative for cough and shortness of breath.   Cardiovascular: Negative for chest pain or palpitations.  Gastrointestinal: Negative for abdominal pain, no bowel changes.  Musculoskeletal: Negative for gait problem or joint swelling.  Skin: Negative for rash.  Neurological: Negative for dizziness or headache.  No other specific complaints in a complete review of systems (except as listed in HPI above).  Objective  Vitals:   08/10/19 0925  BP: 120/70  Pulse: 77  Resp: 16  Temp: (!) 97.3 F (36.3 C)  TempSrc: Temporal  SpO2: 98%  Weight: 156 lb 12.8 oz (71.1 kg)  Height: 5\' 1"  (1.549 m)    Body mass index is 29.63 kg/m.  Physical Exam  Constitutional: Patient appears well-developed and well-nourished.  No distress.  HEENT: head atraumatic, normocephalic, pupils equal and reactive to light Pelvic: vaginal atrophy, urethra also has atrophy but no lesions, normal bimanual exam, cervix absent  Cardiovascular: Normal rate, regular rhythm and normal heart sounds.  No murmur heard. No BLE edema. Pulmonary/Chest: Effort normal and breath sounds normal. No respiratory distress. Abdominal: Soft.  There is no tenderness. Psychiatric: Patient has a  normal mood and affect. behavior is normal. Judgment and thought content normal.  PHQ2/9: Depression screen [facility] 2/9 08/10/2019 12/23/2018 12/14/2018 12/07/2018 06/24/2018  Decreased Interest 0 0 0 0 0  Down, Depressed, Hopeless 0 0 0 0 0  PHQ - 2 Score 0 0 0 0 0  Altered sleeping 0 - 0 - 0  Tired, decreased energy 0 - 0 - 0  Change in appetite 0 - 0 - 0  Feeling bad or failure about yourself  0 - 0 - 0  Trouble concentrating 0 - 0 - 0  Moving slowly or fidgety/restless 0 - 0 - 0  Suicidal thoughts 0 - 0 - 0  PHQ-9 Score 0 - 0 - 0  Difficult doing work/chores Not difficult at all - - - Not  difficult at all    phq 9 is negative   Fall Risk: Fall Risk  08/10/2019 12/14/2018 12/07/2018 06/24/2018 12/08/2017  Falls in the past year? 0 0 0 0 No  Number falls in past yr: 0 0 0 0 -  Injury with Fall? 0 0 0 0 -  Risk for fall due to : - - - - Impaired vision  Risk for fall due to: Comment - - - - wears eyeglasses  Follow up - - Falls prevention discussed - -     Functional Status Survey: Is the patient deaf or have difficulty hearing?: No Does the patient have difficulty seeing, even when wearing glasses/contacts?: No Does the patient have difficulty concentrating, remembering, or making decisions?: No Does the patient have difficulty walking or climbing stairs?: No Does the patient have difficulty dressing or bathing?: No Does the patient have difficulty doing errands alone such as visiting a doctor's office or shopping?: No    Assessment & Plan  1. Pure hypercholesterolemia  - Lipid panel  2. Senile purpura (HCC)  Stable  3. Low serum vitamin B12  - Vitamin B12  4. Encounter for long-term (current) use of high-risk medication  - COMPLETE METABOLIC PANEL WITH GFR  5. Hematuria, unspecified type  - Urinalysis, Complete  6. Vaginal atrophy  - conjugated estrogens (PREMARIN) vaginal cream; Place 1 Applicatorful vaginally daily. One pea size on introitus  Dispense: 42.5 g;  Refill: 12

## 2019-08-11 LAB — COMPLETE METABOLIC PANEL WITH GFR
AG Ratio: 1.6 (calc) (ref 1.0–2.5)
ALT: 10 U/L (ref 6–29)
AST: 14 U/L (ref 10–35)
Albumin: 4.4 g/dL (ref 3.6–5.1)
Alkaline phosphatase (APISO): 38 U/L (ref 37–153)
BUN: 18 mg/dL (ref 7–25)
CO2: 32 mmol/L (ref 20–32)
Calcium: 9.9 mg/dL (ref 8.6–10.4)
Chloride: 103 mmol/L (ref 98–110)
Creat: 0.77 mg/dL (ref 0.60–0.93)
GFR, Est African American: 88 mL/min/{1.73_m2} (ref >=60)
GFR, Est Non African American: 76 mL/min/{1.73_m2} (ref >=60)
Globulin: 2.7 g/dL (calc) (ref 1.9–3.7)
Glucose, Bld: 93 mg/dL (ref 65–99)
Potassium: 4.9 mmol/L (ref 3.5–5.3)
Sodium: 142 mmol/L (ref 135–146)
Total Bilirubin: 0.3 mg/dL (ref 0.2–1.2)
Total Protein: 7.1 g/dL (ref 6.1–8.1)

## 2019-08-11 LAB — URINALYSIS, COMPLETE
Bacteria, UA: NONE SEEN /HPF
Bilirubin Urine: NEGATIVE
Glucose, UA: NEGATIVE
Hyaline Cast: NONE SEEN /LPF
Ketones, ur: NEGATIVE
Nitrite: NEGATIVE
Protein, ur: NEGATIVE
RBC / HPF: NONE SEEN /HPF (ref 0–2)
Specific Gravity, Urine: 1.009 (ref 1.001–1.03)
pH: 7.5 (ref 5.0–8.0)

## 2019-08-11 LAB — LIPID PANEL
Cholesterol: 191 mg/dL (ref <200)
HDL: 71 mg/dL (ref >=50)
LDL Cholesterol (Calc): 99 mg/dL (calc)
Non-HDL Cholesterol (Calc): 120 mg/dL (calc) (ref <130)
Total CHOL/HDL Ratio: 2.7 (calc) (ref <5.0)
Triglycerides: 118 mg/dL (ref <150)

## 2019-08-11 LAB — VITAMIN B12: Vitamin B-12: 1350 pg/mL — ABNORMAL HIGH (ref 200–1100)

## 2019-08-12 ENCOUNTER — Telehealth: Payer: Self-pay

## 2019-08-12 NOTE — Telephone Encounter (Signed)
Copied from [place] [id number]. Topic: General - Other >> Aug 12, 2019  9:29 AM Celene Kras wrote: Reason for CRM: Pt called stating that she received the results for her labs and urine sample; however, pt is concerned because the blood in her urine was not discussed. Please advise.

## 2019-08-15 NOTE — Telephone Encounter (Signed)
Patient called she said she felt better with the lubricant you used during the exam. She was confused about what you said and she does not want to see a urologist at this time. She is not going to think about it anymore. She would like for you to recheck when she has her physical to make sure no blood is in her urine.

## 2019-08-23 DIAGNOSIS — M5033 Other cervical disc degeneration, cervicothoracic region: Secondary | ICD-10-CM | POA: Diagnosis not present

## 2019-08-23 DIAGNOSIS — M9903 Segmental and somatic dysfunction of lumbar region: Secondary | ICD-10-CM | POA: Diagnosis not present

## 2019-08-23 DIAGNOSIS — M62830 Muscle spasm of back: Secondary | ICD-10-CM | POA: Diagnosis not present

## 2019-08-23 DIAGNOSIS — M9901 Segmental and somatic dysfunction of cervical region: Secondary | ICD-10-CM | POA: Diagnosis not present

## 2019-09-21 DIAGNOSIS — M9903 Segmental and somatic dysfunction of lumbar region: Secondary | ICD-10-CM | POA: Diagnosis not present

## 2019-09-21 DIAGNOSIS — M5033 Other cervical disc degeneration, cervicothoracic region: Secondary | ICD-10-CM | POA: Diagnosis not present

## 2019-09-21 DIAGNOSIS — M62830 Muscle spasm of back: Secondary | ICD-10-CM | POA: Diagnosis not present

## 2019-09-21 DIAGNOSIS — M9901 Segmental and somatic dysfunction of cervical region: Secondary | ICD-10-CM | POA: Diagnosis not present

## 2019-09-28 ENCOUNTER — Other Ambulatory Visit: Payer: Self-pay

## 2019-09-28 MED ORDER — SIMVASTATIN 20 MG PO TABS: 20.0000 mg | ORAL_TABLET | Freq: Every day | ORAL | 0 refills | Status: DC

## 2019-10-19 DIAGNOSIS — M9903 Segmental and somatic dysfunction of lumbar region: Secondary | ICD-10-CM | POA: Diagnosis not present

## 2019-10-19 DIAGNOSIS — M9901 Segmental and somatic dysfunction of cervical region: Secondary | ICD-10-CM | POA: Diagnosis not present

## 2019-10-19 DIAGNOSIS — M62830 Muscle spasm of back: Secondary | ICD-10-CM | POA: Diagnosis not present

## 2019-10-19 DIAGNOSIS — M5033 Other cervical disc degeneration, cervicothoracic region: Secondary | ICD-10-CM | POA: Diagnosis not present

## 2019-11-23 DIAGNOSIS — M5033 Other cervical disc degeneration, cervicothoracic region: Secondary | ICD-10-CM | POA: Diagnosis not present

## 2019-11-23 DIAGNOSIS — M9901 Segmental and somatic dysfunction of cervical region: Secondary | ICD-10-CM | POA: Diagnosis not present

## 2019-11-23 DIAGNOSIS — M9903 Segmental and somatic dysfunction of lumbar region: Secondary | ICD-10-CM | POA: Diagnosis not present

## 2019-11-23 DIAGNOSIS — M62830 Muscle spasm of back: Secondary | ICD-10-CM | POA: Diagnosis not present

## 2019-11-30 ENCOUNTER — Ambulatory Visit: Payer: PPO | Admitting: Dermatology

## 2019-11-30 ENCOUNTER — Other Ambulatory Visit: Payer: Self-pay

## 2019-11-30 DIAGNOSIS — L814 Other melanin hyperpigmentation: Secondary | ICD-10-CM | POA: Diagnosis not present

## 2019-11-30 DIAGNOSIS — L820 Inflamed seborrheic keratosis: Secondary | ICD-10-CM

## 2019-11-30 DIAGNOSIS — D229 Melanocytic nevi, unspecified: Secondary | ICD-10-CM

## 2019-11-30 DIAGNOSIS — D692 Other nonthrombocytopenic purpura: Secondary | ICD-10-CM | POA: Diagnosis not present

## 2019-11-30 DIAGNOSIS — L821 Other seborrheic keratosis: Secondary | ICD-10-CM | POA: Diagnosis not present

## 2019-11-30 DIAGNOSIS — Z1283 Encounter for screening for malignant neoplasm of skin: Secondary | ICD-10-CM | POA: Diagnosis not present

## 2019-11-30 DIAGNOSIS — L578 Other skin changes due to chronic exposure to nonionizing radiation: Secondary | ICD-10-CM | POA: Diagnosis not present

## 2019-11-30 DIAGNOSIS — D1800 Hemangioma unspecified site: Secondary | ICD-10-CM

## 2019-11-30 NOTE — Progress Notes (Signed)
   Follow-Up Visit   Subjective  Abigail Hatfield is a 74 y.o. female who presents for the following: Annual Exam (patient has noticed lesions on her B/L temple/hairline areas - she used an OTC topical cream which decreased the size of the lesion). The patient presents for Total-Body Skin Exam (TBSE) for skin cancer screening and mole check.  The following portions of the chart were reviewed this encounter and updated as appropriate:  Tobacco  Allergies  Meds  Problems  Med Hx  Surg Hx  Fam Hx     Review of Systems:  No other skin or systemic complaints except as noted in HPI or Assessment and Plan.  Objective  Well appearing patient in no apparent distress; mood and affect are within normal limits.  A full examination was performed including scalp, head, eyes, ears, nose, lips, neck, chest, axillae, abdomen, back, buttocks, bilateral upper extremities, bilateral lower extremities, hands, feet, fingers, toes, fingernails, and toenails. All findings within normal limits unless otherwise noted below.  Objective  R temple, L forehead/hairline (2): Erythematous keratotic or waxy stuck-on papule or plaque.   Assessment & Plan  Inflamed seborrheic keratosis (2) R temple, L forehead/hairline  Destruction of lesion - R temple, L forehead/hairline Complexity: simple   Destruction method: cryotherapy   Informed consent: discussed and consent obtained   Timeout:  patient name, date of birth, surgical site, and procedure verified Lesion destroyed using liquid nitrogen: Yes   Region frozen until ice ball extended beyond lesion: Yes   Outcome: patient tolerated procedure well with no complications   Post-procedure details: wound care instructions given    Skin cancer screening   Lentigines - Scattered tan macules - Discussed due to sun exposure - Benign, observe - Call for any changes  Seborrheic Keratoses - Stuck-on, waxy, tan-brown papules and plaques  - Discussed benign  etiology and prognosis. - Observe - Call for any changes  Melanocytic Nevi - Tan-brown and/or pink-flesh-colored symmetric macules and papules - Benign appearing on exam today - Observation - Call clinic for new or changing moles - Recommend daily use of broad spectrum spf 30+ sunscreen to sun-exposed areas.   Hemangiomas - Red papules - Discussed benign nature - Observe - Call for any changes  Actinic Damage - diffuse scaly erythematous macules with underlying dyspigmentation - Recommend daily broad spectrum sunscreen SPF 30+ to sun-exposed areas, reapply every 2 hours as needed.  - Call for new or changing lesions.  Purpura - Violaceous macules and patches - Benign - Related to age, sun damage and/or use of blood thinners - Observe - Can use OTC arnica containing moisturizer such as Dermend Bruise Formula if desired - Call for worsening or other concerns  Skin cancer screening performed today.  Return in about 1 year (around 11/29/2020) for TBSE.  Luther Redo, CMA, am acting as scribe for Sarina Ser, MD .  Documentation: I have reviewed the above documentation for accuracy and completeness, and I agree with the above.  Sarina Ser, MD

## 2019-12-06 ENCOUNTER — Encounter: Payer: Self-pay | Admitting: Dermatology

## 2019-12-13 ENCOUNTER — Ambulatory Visit: Payer: PPO

## 2019-12-15 NOTE — Progress Notes (Addendum)
Name: Abigail Hatfield   MRN: [id number]    DOB: [birth year]   Date:12/16/2019       Progress Note  Subjective  Chief Complaint  Chief Complaint  Patient presents with  . Annual Exam    HPI  Patient presents for annual CPE and follow up  Hyperlipidemia: she is taking simvastatin now and last LDL is down to 99, no side effects of medication, Doing excellent and we will recheck yearly    Stress; she is very stressed carrying for her husband, his dementia is progressing. She is no longer mother's caregiver, her aunt stays with her mother 6 months of the year.   Hyperglycemia: on labs done at [facility], denies polyphagia, polydipsia or polyuria. Last A1C was normal, recheck next visit   Senile purpura: on both arms, reassurance given again today   Chronic back pain: she sees a chiropractor, has daily pain, from cervical spine to lumbar spine, uses tens units to control symptoms, no radiculitis, discussed importance of physical activity. No bowel incontinence  B12 deficiency: continue supplementation, denies fatigue   Diet: she has increased calcium intake, drinking 8 oz of milk daily, she is not having enough fruit and vegetables.  Exercise:     Office Visit from 12/16/2019 in [facility]  AUDIT-C Score 0     Depression: Phq 9 is  negative Depression screen [facility] 2/9 12/16/2019 08/10/2019 12/23/2018 12/14/2018 12/07/2018  Decreased Interest 0 0 0 0 0  Down, Depressed, Hopeless 0 0 0 0 0  PHQ - 2 Score 0 0 0 0 0  Altered sleeping - 0 - 0 -  Tired, decreased energy - 0 - 0 -  Change in appetite - 0 - 0 -  Feeling bad or failure about yourself  - 0 - 0 -  Trouble concentrating - 0 - 0 -  Moving slowly or fidgety/restless - 0 - 0 -  Suicidal thoughts - 0 - 0 -  PHQ-9 Score - 0 - 0 -  Difficult doing work/chores - Not difficult at all - - -   Hypertension: BP Readings from Last 3 Encounters:  12/16/19 130/80  08/10/19 120/70  12/14/18 110/64   Obesity: Wt Readings  from Last 3 Encounters:  12/16/19 158 lb 9.6 oz (71.9 kg)  08/10/19 156 lb 12.8 oz (71.1 kg)  12/14/18 152 lb 8 oz (69.2 kg)   BMI Readings from Last 3 Encounters:  12/16/19 30.97 kg/m  08/10/19 29.63 kg/m  12/14/18 29.78 kg/m     Vaccines:  Shingrix: 90-64 yo and ask insurance if covered when patient above 45 yo Pneumonia:  educated and discussed with patient - refused. Flu: educated and discussed with patient - refused   Hep C Screening: 11/26/2016 STD testing and prevention (HIV/chl/gon/syphilis):Not interested  Intimate partner violence: negative screen  Sexual History :not sexually active in the past 20 years  Menstrual History/LMP/Abnormal Bleeding: s/p hysterectomy   Incontinence Symptoms: only when she coughs very hard or hold too long to go to the bathroom, discussed kegel exercises  Breast cancer:  - Last Mammogram: 01/24/2019 - BRCA gene screening: N/A  Osteoporosis: Discussed high calcium and vitamin D supplementation, weight bearing exercises  Cervical cancer screening: Hysterectomy 1980  Skin cancer: Discussed monitoring for atypical lesions  Colorectal cancer: 12/24/2017 Lung cancer: Low Dose CT Chest recommended if Age 61-80 years, 30 pack-year currently smoking OR have quit w/in 15years. Patient does qualify.   ECG: 06/07/2018  Advanced Care Planning: A voluntary discussion about advance  care planning including the explanation and discussion of advance directives.  Discussed health care proxy and Living will, and the patient was able to identify a health care proxy as daughter .  Patient does have a living will at present time. If patient does have living will, I have requested they bring this to the clinic to be scanned in to their chart.  Lipids: Lab Results  Component Value Date   CHOL 191 08/10/2019   CHOL 247 (H) 12/14/2018   CHOL 239 (H) 12/07/2017   Lab Results  Component Value Date   HDL 71 08/10/2019   HDL 72 12/14/2018   HDL 78 12/07/2017    Lab Results  Component Value Date   LDLCALC 99 08/10/2019   LDLCALC 153 (H) 12/14/2018   LDLCALC 141 (H) 12/07/2017   Lab Results  Component Value Date   TRIG 118 08/10/2019   TRIG 106 12/14/2018   TRIG 90 12/07/2017   Lab Results  Component Value Date   CHOLHDL 2.7 08/10/2019   CHOLHDL 3.4 12/14/2018   CHOLHDL 3.1 12/07/2017   No results found for: LDLDIRECT  Glucose: Glucose, Bld  Date Value Ref Range Status  08/10/2019 93 65 - 99 mg/dL Final    Comment:    .            Fasting reference interval .   12/14/2018 85 65 - 99 mg/dL Final    Comment:    .            Fasting reference interval .   06/06/2018 100 (H) 70 - 99 mg/dL Final    Patient Active Problem List   Diagnosis Date Noted  . Obesity (BMI 30.0-34.9) 12/07/2017  . Degenerative joint disease of low back 12/07/2017  . Senile purpura ([place]) 12/07/2017  . Hyperlipidemia 12/21/2014    Past Surgical History:  Procedure Laterality Date  . ABDOMINAL HYSTERECTOMY  1980  . CATARACT EXTRACTION W/PHACO Right 06/24/2017   Procedure: CATARACT EXTRACTION PHACO AND INTRAOCULAR LENS PLACEMENT (IOC);  Surgeon: Birder Robson, MD;  Location: [facility] ORS;  Service: Ophthalmology;  Laterality: Right;  [place] 00:48.0 AP% 16.5 CDE 7.92 Fluid Pack Lot # [id number]  . CATARACT EXTRACTION W/PHACO Left 07/14/2017   Procedure: CATARACT EXTRACTION PHACO AND INTRAOCULAR LENS PLACEMENT (IOC);  Surgeon: Birder Robson, MD;  Location: [facility] ORS;  Service: Ophthalmology;  Laterality: Left;  [place] 00:36.0 AP% 16.9 CDE 6.08 Fluid Pack Lot # [id number] [place]  . CESAREAN SECTION  1971  . CHOLECYSTECTOMY  1972  . COLONOSCOPY WITH PROPOFOL N/A 12/24/2017   Procedure: COLONOSCOPY WITH PROPOFOL;  Surgeon: Jonathon Bellows, MD;  Location: [facility] ENDOSCOPY;  Service: Gastroenterology;  Laterality: N/A;  . EYE SURGERY    . retal fistula repair     Dr. Jamal Collin x 2    Family History  Problem Relation Age of Onset  . Diabetes  Mother   . Hypertension Mother   . Heart attack Mother   . Heart failure Father   . Heart disease Father   . Diabetes Brother   . Hypertension Brother   . COPD Brother   . Diabetes Sister   . Breast cancer Neg Hx     Social History   Socioeconomic History  . Marital status: Married    Spouse name: Percell Miller  . Number of children: 2  . Years of education: some college  . Highest education level: 12th grade  Occupational History  . Occupation: Retired    Comment: since  04/2008  Tobacco Use  . Smoking status: Former Smoker    Packs/day: 0.50    Years: 5.00    Pack years: 2.50    Types: Cigarettes    Quit date: 12/07/1988    Years since quitting: 31.0  . Smokeless tobacco: Never Used  . Tobacco comment: smoking cessation materials not required  Vaping Use  . Vaping Use: Never used  Substance and Sexual Activity  . Alcohol use: No    Alcohol/week: 0.0 standard drinks  . Drug use: No  . Sexual activity: Not Currently    Partners: Male    Birth control/protection: None    Comment: patient's husband has prostate issues so she cannot with him. has not in 22yr.  Other Topics Concern  . Not on file  Social History Narrative   Mother is 95yrold that has dementia, she has a siActuaryut still checks on her       Husband has prostate issues and early dementia, he has been losing his patience and it has been stressful       Patient has 7 deteriorated disc   Social Determinants of Health   Financial Resource Strain: Low Risk   . Difficulty of Paying Living Expenses: Not hard at all  Food Insecurity: No Food Insecurity  . Worried About RuCharity fundraisern the Last Year: Never true  . Ran Out of Food in the Last Year: Never true  Transportation Needs: No Transportation Needs  . Lack of Transportation (Medical): No  . Lack of Transportation (Non-Medical): No  Physical Activity: Inactive  . Days of Exercise per Week: 0 days  . Minutes of Exercise per Session: 0 min   Stress: No Stress Concern Present  . Feeling of Stress : Only a little  Social Connections: Moderately Integrated  . Frequency of Communication with Friends and Family: More than three times a week  . Frequency of Social Gatherings with Friends and Family: Once a week  . Attends Religious Services: More than 4 times per year  . Active Member of Clubs or Organizations: No  . Attends ClArchivisteetings: Never  . Marital Status: Married  InHuman resources officeriolence: Not At Risk  . Fear of Current or Ex-Partner: No  . Emotionally Abused: No  . Physically Abused: No  . Sexually Abused: No     Current Outpatient Medications:  .  Cholecalciferol (VITAMIN D3) 5000 UNITS CAPS, Take 5,000 Units by mouth every other day. , Disp: , Rfl:  .  conjugated estrogens (PREMARIN) vaginal cream, Place 1 Applicatorful vaginally daily. One pea size on introitus, Disp: 42.5 g, Rfl: 12 .  Evening Primrose Oil 1000 MG CAPS, Take 3 capsules by mouth daily., Disp: , Rfl:  .  OVER THE COUNTER MEDICATION, 1 capsule 2 (two) times daily. , Disp: , Rfl:  .  pyridoxine (B-6) 100 MG tablet, Take 100 mg by mouth daily., Disp: , Rfl:  .  simvastatin (ZOCOR) 20 MG tablet, Take 1 tablet (20 mg total) by mouth at bedtime., Disp: 90 tablet, Rfl: 0 .  vitamin B-12 (CYANOCOBALAMIN) 1000 MCG tablet, Take 1,000 mcg by mouth daily., Disp: , Rfl:   Allergies  Allergen Reactions  . Moxifloxacin Hcl In Nacl Swelling  . Prednisone Other (See Comments)    Paranoid  . Penicillins Rash and Other (See Comments)    Has patient had a PCN reaction causing immediate rash, facial/tongue/throat swelling, SOB or lightheadedness with hypotension: Unknown Has patient had a PCN  reaction causing severe rash involving mucus membranes or skin necrosis: No Has patient had a PCN reaction that required hospitalization: Yes Has patient had a PCN reaction occurring within the last 10 years: No If all of the above answers are "NO", then may  proceed with Cephalosporin use.      ROS  Constitutional: Negative for fever or weight change.  Respiratory: Negative for cough and shortness of breath.   Cardiovascular: Negative for chest pain or palpitations.  Gastrointestinal: Negative for abdominal pain, no bowel changes.  Musculoskeletal: Negative for gait problem or joint swelling.  Skin: Negative for rash.  Neurological: Negative for dizziness or headache.  No other specific complaints in a complete review of systems (except as listed in HPI above).  Objective  Vitals:   12/16/19 0817  BP: 130/80  Pulse: 71  Resp: 16  Temp: 97.8 F (36.6 C)  TempSrc: Oral  SpO2: 100%  Weight: 158 lb 9.6 oz (71.9 kg)  Height: 5' (1.524 m)    Body mass index is 30.97 kg/m.  Physical Exam  Constitutional: Patient appears well-developed and obese . No distress.  HENT: Head: Normocephalic and atraumatic. Ears: B TMs ok, no erythema or effusion; Nose: not done Mouth/Throat: not done  Eyes: Conjunctivae and EOM are normal. Pupils are equal, round, and reactive to light. No scleral icterus.  Neck: Normal range of motion. Neck supple. No JVD present. No thyromegaly present.  Cardiovascular: Normal rate, regular rhythm and normal heart sounds.  No murmur heard. No BLE edema. Pulmonary/Chest: Effort normal and breath sounds normal. No respiratory distress. Abdominal: Soft. Bowel sounds are normal, no distension. There is no tenderness. no masses Breast: no lumps or masses, no nipple discharge or rashes FEMALE GENITALIA:  Not done RECTAL: not done  Musculoskeletal: tens units on her back, tender to palpation on lumbar spine, negative straight leg raise Neurological: he is alert and oriented to person, place, and time. No cranial nerve deficit. Coordination, balance, strength, speech and gait are normal.  Skin: Skin is warm and dry. No rash noted. Excoriation on both arms and also senile purpura  Psychiatric: Patient has a normal mood and  affect. behavior is normal. Judgment and thought content normal.  Fall Risk: Fall Risk  12/16/2019 08/10/2019 12/14/2018 12/07/2018 06/24/2018  Falls in the past year? 0 0 0 0 0  Number falls in past yr: 0 0 0 0 0  Injury with Fall? 0 0 0 0 0  Risk for fall due to : - - - - -  Risk for fall due to: Comment - - - - -  Follow up - - - Falls prevention discussed -     Functional Status Survey: Is the patient deaf or have difficulty hearing?: No Does the patient have difficulty seeing, even when wearing glasses/contacts?: No Does the patient have difficulty concentrating, remembering, or making decisions?: No Does the patient have difficulty walking or climbing stairs?: No Does the patient have difficulty dressing or bathing?: No Does the patient have difficulty doing errands alone such as visiting a doctor's office or shopping?: No   Assessment & Plan  1. Well adult exam  Discussed importance of exercise   2. Need for vaccination with 13-polyvalent pneumococcal conjugate vaccine  Refused   3. Needs flu shot  Refused   4. Breast cancer screening by mammogram  - MM 3D SCREEN BREAST BILATERAL; Future  5. Pure hypercholesterolemia  On statin therapy   - Lipid panel - COMPLETE METABOLIC PANEL WITH GFR  6. Senile purpura (HCC)  Stable   7. Low serum vitamin B12  She is still taking supplementation, we will recheck next visit   8. Stress due to family tension  Does not want medications  9. Hyperglycemia  - Hemoglobin A1c   10. DDD (degenerative disc disease), lumbosacral   11. Chronic back pain, unspecified back location, unspecified back pain laterality   -USPSTF grade A and B recommendations reviewed with patient; age-appropriate recommendations, preventive care, screening tests, etc discussed and encouraged; healthy living encouraged; see AVS for patient education given to patient -Discussed importance of 150 minutes of physical activity weekly, eat two  servings of fish weekly, eat one serving of tree nuts ( cashews, pistachios, pecans, almonds.Marland Kitchen) every other day, eat 6 servings of fruit/vegetables daily and drink plenty of water and avoid sweet beverages.

## 2019-12-16 ENCOUNTER — Other Ambulatory Visit: Payer: Self-pay

## 2019-12-16 ENCOUNTER — Encounter: Payer: Self-pay | Admitting: Family Medicine

## 2019-12-16 ENCOUNTER — Ambulatory Visit (INDEPENDENT_AMBULATORY_CARE_PROVIDER_SITE_OTHER): Payer: PPO | Admitting: Family Medicine

## 2019-12-16 ENCOUNTER — Telehealth: Payer: Self-pay | Admitting: Family Medicine

## 2019-12-16 VITALS — BP 130/80 | HR 71 | Temp 97.8°F | Resp 16 | Ht 60.0 in | Wt 158.6 lb

## 2019-12-16 DIAGNOSIS — Z23 Encounter for immunization: Secondary | ICD-10-CM

## 2019-12-16 DIAGNOSIS — Z638 Other specified problems related to primary support group: Secondary | ICD-10-CM

## 2019-12-16 DIAGNOSIS — Z1231 Encounter for screening mammogram for malignant neoplasm of breast: Secondary | ICD-10-CM | POA: Diagnosis not present

## 2019-12-16 DIAGNOSIS — M549 Dorsalgia, unspecified: Secondary | ICD-10-CM | POA: Diagnosis not present

## 2019-12-16 DIAGNOSIS — G8929 Other chronic pain: Secondary | ICD-10-CM

## 2019-12-16 DIAGNOSIS — E538 Deficiency of other specified B group vitamins: Secondary | ICD-10-CM

## 2019-12-16 DIAGNOSIS — E7800 Pure hypercholesterolemia, unspecified: Secondary | ICD-10-CM | POA: Diagnosis not present

## 2019-12-16 DIAGNOSIS — D692 Other nonthrombocytopenic purpura: Secondary | ICD-10-CM

## 2019-12-16 DIAGNOSIS — M5137 Other intervertebral disc degeneration, lumbosacral region: Secondary | ICD-10-CM

## 2019-12-16 DIAGNOSIS — R739 Hyperglycemia, unspecified: Secondary | ICD-10-CM | POA: Diagnosis not present

## 2019-12-16 DIAGNOSIS — Z0000 Encounter for general adult medical examination without abnormal findings: Secondary | ICD-10-CM | POA: Diagnosis not present

## 2019-12-16 NOTE — Telephone Encounter (Signed)
FYI/ Pt stated she forgot to mention to Dr. Ancil Boozer that she has pinched nerves in her back /

## 2019-12-16 NOTE — Patient Instructions (Signed)
Preventive Care 51 Years and Older, Female Preventive care refers to lifestyle choices and visits with your health care provider that can promote health and wellness. This includes:  A yearly physical exam. This is also called an annual well check.  Regular dental and eye exams.  Immunizations.  Screening for certain conditions.  Healthy lifestyle choices, such as diet and exercise. What can I expect for my preventive care visit? Physical exam Your health care provider will check:  Height and weight. These may be used to calculate body mass index (BMI), which is a measurement that tells if you are at a healthy weight.  Heart rate and blood pressure.  Your skin for abnormal spots. Counseling Your health care provider may ask you questions about:  Alcohol, tobacco, and drug use.  Emotional well-being.  Home and relationship well-being.  Sexual activity.  Eating habits.  History of falls.  Memory and ability to understand (cognition).  Work and work Statistician.  Pregnancy and menstrual history. What immunizations do I need?  Influenza (flu) vaccine  This is recommended every year. Tetanus, diphtheria, and pertussis (Tdap) vaccine  You may need a Td booster every 10 years. Varicella (chickenpox) vaccine  You may need this vaccine if you have not already been vaccinated. Zoster (shingles) vaccine  You may need this after age 39. Pneumococcal conjugate (PCV13) vaccine  One dose is recommended after age 73. Pneumococcal polysaccharide (PPSV23) vaccine  One dose is recommended after age 13. Measles, mumps, and rubella (MMR) vaccine  You may need at least one dose of MMR if you were born in 1957 or later. You may also need a second dose. Meningococcal conjugate (MenACWY) vaccine  You may need this if you have certain conditions. Hepatitis A vaccine  You may need this if you have certain conditions or if you travel or work in places where you may be exposed  to hepatitis A. Hepatitis B vaccine  You may need this if you have certain conditions or if you travel or work in places where you may be exposed to hepatitis B. Haemophilus influenzae type b (Hib) vaccine  You may need this if you have certain conditions. You may receive vaccines as individual doses or as more than one vaccine together in one shot (combination vaccines). Talk with your health care provider about the risks and benefits of combination vaccines. What tests do I need? Blood tests  Lipid and cholesterol levels. These may be checked every 5 years, or more frequently depending on your overall health.  Hepatitis C test.  Hepatitis B test. Screening  Lung cancer screening. You may have this screening every year starting at age 69 if you have a 30-pack-year history of smoking and currently smoke or have quit within the past 15 years.  Colorectal cancer screening. All adults should have this screening starting at age 71 and continuing until age 4. Your health care provider may recommend screening at age 64 if you are at increased risk. You will have tests every 1-10 years, depending on your results and the type of screening test.  Diabetes screening. This is done by checking your blood sugar (glucose) after you have not eaten for a while (fasting). You may have this done every 1-3 years.  Mammogram. This may be done every 1-2 years. Talk with your health care provider about how often you should have regular mammograms.  BRCA-related cancer screening. This may be done if you have a family history of breast, ovarian, tubal, or peritoneal cancers.  Other tests °· Sexually transmitted disease (STD) testing. °· Bone density scan. This is done to screen for osteoporosis. You may have this done starting at age 65. °Follow these instructions at home: °Eating and drinking °· Eat a diet that includes fresh fruits and vegetables, whole grains, lean protein, and low-fat dairy products. Limit  your intake of foods with high amounts of sugar, saturated fats, and salt. °· Take vitamin and mineral supplements as recommended by your health care provider. °· Do not drink alcohol if your health care provider tells you not to drink. °· If you drink alcohol: °? Limit how much you have to 0-1 drink a day. °? Be aware of how much alcohol is in your drink. In the [place] one drink equals one 12 oz bottle of beer (355 mL), one 5 oz glass of wine (148 mL), or one 1½ oz glass of hard liquor (44 mL). °Lifestyle °· Take daily care of your teeth and gums. °· Stay active. Exercise for at least 30 minutes on 5 or more days each week. °· Do not use any products that contain nicotine or tobacco, such as cigarettes, e-cigarettes, and chewing tobacco. If you need help quitting, ask your health care provider. °· If you are sexually active, practice safe sex. Use a condom or other form of protection in order to prevent STIs (sexually transmitted infections). °· Talk with your health care provider about taking a low-dose aspirin or statin. °What's next? °· Go to your health care provider once a year for a well check visit. °· Ask your health care provider how often you should have your eyes and teeth checked. °· Stay up to date on all vaccines. °This information is not intended to replace advice given to you by your health care provider. Make sure you discuss any questions you have with your health care provider. °Document Revised: 03/25/2018 Document Reviewed: 03/25/2018 °Elsevier Patient Education © 2020 [employer] ° °

## 2019-12-17 LAB — COMPLETE METABOLIC PANEL WITH GFR
AG Ratio: 1.6 (calc) (ref 1.0–2.5)
ALT: 19 U/L (ref 6–29)
AST: 20 U/L (ref 10–35)
Albumin: 4.3 g/dL (ref 3.6–5.1)
Alkaline phosphatase (APISO): 40 U/L (ref 37–153)
BUN: 14 mg/dL (ref 7–25)
CO2: 27 mmol/L (ref 20–32)
Calcium: 9.5 mg/dL (ref 8.6–10.4)
Chloride: 106 mmol/L (ref 98–110)
Creat: 0.77 mg/dL (ref 0.60–0.93)
GFR, Est African American: 88 mL/min/{1.73_m2} (ref >=60)
GFR, Est Non African American: 76 mL/min/{1.73_m2} (ref >=60)
Globulin: 2.7 g/dL (calc) (ref 1.9–3.7)
Glucose, Bld: 89 mg/dL (ref 65–99)
Potassium: 4.7 mmol/L (ref 3.5–5.3)
Sodium: 142 mmol/L (ref 135–146)
Total Bilirubin: 0.3 mg/dL (ref 0.2–1.2)
Total Protein: 7 g/dL (ref 6.1–8.1)

## 2019-12-17 LAB — LIPID PANEL
Cholesterol: 186 mg/dL (ref <200)
HDL: 73 mg/dL (ref >=50)
LDL Cholesterol (Calc): 93 mg/dL (calc)
Non-HDL Cholesterol (Calc): 113 mg/dL (calc) (ref <130)
Total CHOL/HDL Ratio: 2.5 (calc) (ref <5.0)
Triglycerides: 106 mg/dL (ref <150)

## 2019-12-17 LAB — HEMOGLOBIN A1C
Hgb A1c MFr Bld: 5.5 % of total Hgb (ref <5.7)
Mean Plasma Glucose: 111 (calc)
eAG (mmol/L): 6.2 (calc)

## 2019-12-19 ENCOUNTER — Other Ambulatory Visit: Payer: Self-pay | Admitting: Family Medicine

## 2019-12-20 ENCOUNTER — Other Ambulatory Visit: Payer: Self-pay | Admitting: Family Medicine

## 2019-12-20 DIAGNOSIS — Z1231 Encounter for screening mammogram for malignant neoplasm of breast: Secondary | ICD-10-CM

## 2019-12-21 DIAGNOSIS — M9901 Segmental and somatic dysfunction of cervical region: Secondary | ICD-10-CM | POA: Diagnosis not present

## 2019-12-21 DIAGNOSIS — M5033 Other cervical disc degeneration, cervicothoracic region: Secondary | ICD-10-CM | POA: Diagnosis not present

## 2019-12-21 DIAGNOSIS — M9903 Segmental and somatic dysfunction of lumbar region: Secondary | ICD-10-CM | POA: Diagnosis not present

## 2019-12-21 DIAGNOSIS — M62830 Muscle spasm of back: Secondary | ICD-10-CM | POA: Diagnosis not present

## 2020-01-18 DIAGNOSIS — M5033 Other cervical disc degeneration, cervicothoracic region: Secondary | ICD-10-CM | POA: Diagnosis not present

## 2020-01-18 DIAGNOSIS — M62830 Muscle spasm of back: Secondary | ICD-10-CM | POA: Diagnosis not present

## 2020-01-18 DIAGNOSIS — M9901 Segmental and somatic dysfunction of cervical region: Secondary | ICD-10-CM | POA: Diagnosis not present

## 2020-01-18 DIAGNOSIS — M9903 Segmental and somatic dysfunction of lumbar region: Secondary | ICD-10-CM | POA: Diagnosis not present

## 2020-01-19 NOTE — Progress Notes (Signed)
Name: Abigail Hatfield   MRN: [id number]    DOB: February 10, 1946   Date:01/20/2020       Progress Note  Subjective  Chief Complaint  Chief Complaint  Patient presents with  . Leg Pain    right leg ache, comes and goes.  Oset several months    HPI  Right leg pain: she states she noticed pain on lower leg over the past 2 months, yesterday it was the entire anterior leg including her knee, it is like a toothache, she states sometimes she feels like her leg will give out. She states she can not alternate legs when going up or down stairs, has to support with left leg, she feels like it will give it up. She sees chiropractor monthly for her chronic back pain, she has DDD. She denies bowel or bladder incontinence. No burning or tingling on leg  Stress; she is very stressed carrying for her husband, his dementia is progressing, he loses his temper quickly, he screams and gets upset easily . She is no longer mother's caregiver, her aunt stays with her mother 6 months of the year and she is still here. Her mother is 49 yo and has dementia.    Patient Active Problem List   Diagnosis Date Noted  . Obesity (BMI 30.0-34.9) 12/07/2017  . Degenerative joint disease of low back 12/07/2017  . Senile purpura ([place]) 12/07/2017  . Hyperlipidemia 12/21/2014    Past Surgical History:  Procedure Laterality Date  . ABDOMINAL HYSTERECTOMY  1980  . CATARACT EXTRACTION W/PHACO Right 06/24/2017   Procedure: CATARACT EXTRACTION PHACO AND INTRAOCULAR LENS PLACEMENT (IOC);  Surgeon: Birder Robson, MD;  Location: [facility] ORS;  Service: Ophthalmology;  Laterality: Right;  [place] 00:48.0 AP% 16.5 CDE 7.92 Fluid Pack Lot # [id number]  . CATARACT EXTRACTION W/PHACO Left 07/14/2017   Procedure: CATARACT EXTRACTION PHACO AND INTRAOCULAR LENS PLACEMENT (IOC);  Surgeon: Birder Robson, MD;  Location: [facility] ORS;  Service: Ophthalmology;  Laterality: Left;  [place] 00:36.0 AP% 16.9 CDE 6.08 Fluid Pack Lot # [id number] [place]  . CESAREAN SECTION  1971  . CHOLECYSTECTOMY  1972  . COLONOSCOPY WITH PROPOFOL N/A 12/24/2017   Procedure: COLONOSCOPY WITH PROPOFOL;  Surgeon: Jonathon Bellows, MD;  Location[facility];  Service: Gastroenterology;  Laterality: N/A;  . EYE SURGERY    . retal fistula repair     Dr. Jamal Collin x 2    Family History  Problem Relation Age of Onset  . Diabetes Mother   . Hypertension Mother   . Heart attack Mother   . Heart failure Father   . Heart disease Father   . Diabetes Brother   . Hypertension Brother   . COPD Brother   . Diabetes Sister   . Breast cancer Neg Hx     Social History   Tobacco Use  . Smoking status: Former Smoker    Packs/day: 0.50    Years: 5.00    Pack years: 2.50    Types: Cigarettes    Quit date: 12/07/1988    Years since quitting: 31.1  . Smokeless tobacco: Never Used  . Tobacco comment: smoking cessation materials not required  Substance Use Topics  . Alcohol use: No    Alcohol/week: 0.0 standard drinks     Current Outpatient Medications:  .  Cholecalciferol (VITAMIN D3) 5000 UNITS CAPS, Take 5,000 Units by mouth every other day. , Disp: , Rfl:  .  conjugated estrogens (PREMARIN) vaginal cream, Place 1 Applicatorful  vaginally daily. One pea size on introitus, Disp: 42.5 g, Rfl: 12 .  Evening Primrose Oil 1000 MG CAPS, Take 3 capsules by mouth daily., Disp: , Rfl:  .  OVER THE COUNTER MEDICATION, 1 capsule 2 (two) times daily. , Disp: , Rfl:  .  pyridoxine (B-6) 100 MG tablet, Take 100 mg by mouth daily., Disp: , Rfl:  .  simvastatin (ZOCOR) 20 MG tablet, TAKE ONE TABLET BY MOUTH AT BEDTIME, Disp: 90 tablet, Rfl: 0 .  vitamin B-12 (CYANOCOBALAMIN) 1000 MCG tablet, Take 1,000 mcg by mouth daily., Disp: , Rfl:   Allergies  Allergen Reactions  . Moxifloxacin Hcl In Nacl Swelling  . Prednisone Other (See Comments)    Paranoid  . Penicillins Rash and Other (See Comments)    Has patient had a PCN reaction causing immediate rash,  facial/tongue/throat swelling, SOB or lightheadedness with hypotension: Unknown Has patient had a PCN reaction causing severe rash involving mucus membranes or skin necrosis: No Has patient had a PCN reaction that required hospitalization: Yes Has patient had a PCN reaction occurring within the last 10 years: No If all of the above answers are "NO", then may proceed with Cephalosporin use.     I personally reviewed active problem list, medication list, allergies, family history, social history, health maintenance with the patient/caregiver today.   ROS  Constitutional: Negative for fever or weight change.  Respiratory: Negative for cough and shortness of breath.   Cardiovascular: Negative for chest pain or palpitations.  Gastrointestinal: Negative for abdominal pain, no bowel changes.  Musculoskeletal: Negative for gait problem or joint swelling.  Skin: Negative for rash.  Neurological: Negative for dizziness or headache.  No other specific complaints in a complete review of systems (except as listed in HPI above).  Objective  Vitals:   01/20/20 0819  BP: 108/74  Pulse: 89  Resp: 14  Temp: 98.8 F (37.1 C)  SpO2: 100%  Weight: 161 lb 9.6 oz (73.3 kg)  Height: 5' (1.524 m)    Body mass index is 31.56 kg/m.  Physical Exam  Constitutional: Patient appears well-developed and well-nourished. Obese  No distress.  HEENT: head atraumatic, normocephalic, pupils equal and reactive to light, neck supple Cardiovascular: Normal rate, regular rhythm and normal heart sounds.  No murmur heard. No BLE edema. Pulmonary/Chest: Effort normal and breath sounds normal. No respiratory distress. Abdominal: Soft.  There is no tenderness. Psychiatric: Patient has a normal mood and affect. behavior is normal. Judgment and thought content normal. Muscular Skeletal: normal knee and hip exam, normal reflexes patella bilaterally, mild difficulty going on the exam table, pain during palpation of right  lumbar spine. Negative straight leg raise  Recent Results (from the past 2160 hour(s))  Lipid panel     Status: None   Collection Time: 12/16/19  9:16 AM  Result Value Ref Range   Cholesterol 186 <200 mg/dL   HDL 73 > OR = 50 mg/dL   Triglycerides 106 <150 mg/dL   LDL Cholesterol (Calc) 93 mg/dL (calc)    Comment: Reference range: <100 . Desirable range <100 mg/dL for primary prevention;   <70 mg/dL for patients with CHD or diabetic patients  with > or = 2 CHD risk factors. Marland Kitchen LDL-C is now calculated using the Martin-Hopkins  calculation, which is a validated novel method providing  better accuracy than the Friedewald equation in the  estimation of LDL-C.  Cresenciano Genre et al. Annamaria Helling. 7026;378(58): 2061-2068  (http://education.[employer].com/faq/FAQ164)    Total CHOL/HDL Ratio 2.5 <5.0 (  calc)   Non-HDL Cholesterol (Calc) 113 <130 mg/dL (calc)    Comment: For patients with diabetes plus 1 major ASCVD risk  factor, treating to a non-HDL-C goal of <100 mg/dL  (LDL-C of <70 mg/dL) is considered a therapeutic  option.   COMPLETE METABOLIC PANEL WITH GFR     Status: None   Collection Time: 12/16/19  9:16 AM  Result Value Ref Range   Glucose, Bld 89 65 - 99 mg/dL    Comment: .            Fasting reference interval .    BUN 14 7 - 25 mg/dL   Creat 0.77 0.60 - 0.93 mg/dL    Comment: For patients >42 years of age, the reference limit for Creatinine is approximately 13% higher for people identified as African-American. .    GFR, Est Non African American 76 > OR = 60 mL/min/1.25m2   GFR, Est African American 88 > OR = 60 mL/min/1.23m2   BUN/Creatinine Ratio NOT APPLICABLE 6 - 22 (calc)   Sodium 142 135 - 146 mmol/L   Potassium 4.7 3.5 - 5.3 mmol/L   Chloride 106 98 - 110 mmol/L   CO2 27 20 - 32 mmol/L   Calcium 9.5 8.6 - 10.4 mg/dL   Total Protein 7.0 6.1 - 8.1 g/dL   Albumin 4.3 3.6 - 5.1 g/dL   Globulin 2.7 1.9 - 3.7 g/dL (calc)   AG Ratio 1.6 1.0 - 2.5 (calc)   Total  Bilirubin 0.3 0.2 - 1.2 mg/dL   Alkaline phosphatase (APISO) 40 37 - 153 U/L   AST 20 10 - 35 U/L   ALT 19 6 - 29 U/L  Hemoglobin A1c     Status: None   Collection Time: 12/16/19  9:16 AM  Result Value Ref Range   Hgb A1c MFr Bld 5.5 <5.7 % of total Hgb    Comment: For the purpose of screening for the presence of diabetes: . <5.7%       Consistent with the absence of diabetes 5.7-6.4%    Consistent with increased risk for diabetes             (prediabetes) > or =6.5%  Consistent with diabetes . This assay result is consistent with a decreased risk of diabetes. . Currently, no consensus exists regarding use of hemoglobin A1c for diagnosis of diabetes in children. . According to American Diabetes Association (ADA) guidelines, hemoglobin A1c <7.0% represents optimal control in non-pregnant diabetic patients. Different metrics may apply to specific patient populations.  Standards of Medical Care in Diabetes(ADA). .    Mean Plasma Glucose 111 (calc)   eAG (mmol/L) 6.2 (calc)      PHQ2/9: Depression screen [facility] 2/9 01/20/2020 12/16/2019 08/10/2019 12/23/2018 12/14/2018  Decreased Interest 0 0 0 0 0  Down, Depressed, Hopeless 0 0 0 0 0  PHQ - 2 Score 0 0 0 0 0  Altered sleeping - - 0 - 0  Tired, decreased energy - - 0 - 0  Change in appetite - - 0 - 0  Feeling bad or failure about yourself  - - 0 - 0  Trouble concentrating - - 0 - 0  Moving slowly or fidgety/restless - - 0 - 0  Suicidal thoughts - - 0 - 0  PHQ-9 Score - - 0 - 0  Difficult doing work/chores - - Not difficult at all - -    phq 9 is negative   Fall Risk: Fall Risk  01/20/2020 12/16/2019 08/10/2019  12/14/2018 12/07/2018  Falls in the past year? 0 0 0 0 0  Number falls in past yr: 0 0 0 0 0  Injury with Fall? 0 0 0 0 0  Risk for fall due to : - - - - -  Risk for fall due to: Comment - - - - -  Follow up - - - - Falls prevention discussed     Functional Status Survey: Is the patient deaf or have difficulty  hearing?: No Does the patient have difficulty seeing, even when wearing glasses/contacts?: No Does the patient have difficulty concentrating, remembering, or making decisions?: No Does the patient have difficulty walking or climbing stairs?: Yes Does the patient have difficulty dressing or bathing?: No Does the patient have difficulty doing errands alone such as visiting a doctor's office or shopping?: No    Assessment & Plan  1. Radicular syndrome of right leg  - gabapentin (NEURONTIN) 100 MG capsule; Take 1-2 capsules (100-200 mg total) by mouth 2 (two) times daily.  Dispense: 60 capsule; Refill: 0

## 2020-01-20 ENCOUNTER — Encounter: Payer: Self-pay | Admitting: Family Medicine

## 2020-01-20 ENCOUNTER — Ambulatory Visit (INDEPENDENT_AMBULATORY_CARE_PROVIDER_SITE_OTHER): Payer: PPO | Admitting: Family Medicine

## 2020-01-20 ENCOUNTER — Other Ambulatory Visit: Payer: Self-pay

## 2020-01-20 VITALS — BP 108/74 | HR 89 | Temp 98.8°F | Resp 14 | Ht 60.0 in | Wt 161.6 lb

## 2020-01-20 DIAGNOSIS — M5410 Radiculopathy, site unspecified: Secondary | ICD-10-CM

## 2020-01-20 MED ORDER — GABAPENTIN 100 MG PO CAPS: 100.0000 mg | ORAL_CAPSULE | Freq: Two times a day (BID) | ORAL | 0 refills | Status: DC

## 2020-01-26 ENCOUNTER — Other Ambulatory Visit: Payer: Self-pay

## 2020-01-26 ENCOUNTER — Ambulatory Visit
Admission: RE | Admit: 2020-01-26 | Discharge: 2020-01-26 | Disposition: A | Discharge status: Home / Self Care | Payer: PPO | Source: Ambulatory Visit | Attending: Family Medicine | Admitting: Family Medicine

## 2020-01-26 DIAGNOSIS — Z1231 Encounter for screening mammogram for malignant neoplasm of breast: Secondary | ICD-10-CM | POA: Diagnosis not present

## 2020-02-09 ENCOUNTER — Ambulatory Visit (INDEPENDENT_AMBULATORY_CARE_PROVIDER_SITE_OTHER): Payer: PPO

## 2020-02-09 DIAGNOSIS — Z0000 Encounter for general adult medical examination without abnormal findings: Secondary | ICD-10-CM | POA: Diagnosis not present

## 2020-02-09 NOTE — Patient Instructions (Signed)
Abigail Hatfield , Thank you for taking time to come for your Medicare Wellness Visit. I appreciate your ongoing commitment to your health goals. Please review the following plan we discussed and let me know if I can assist you in the future.   Screening recommendations/referrals: Colonoscopy: done 12/24/17 Mammogram: done 01/26/20 Bone Density: done 01/11/18 Recommended yearly ophthalmology/optometry visit for glaucoma screening and checkup Recommended yearly dental visit for hygiene and checkup  Vaccinations: Influenza vaccine: declined Pneumococcal vaccine: declined Tdap vaccine: done 02/01/13 Shingles vaccine: Shingrix discussed. Please contact your pharmacy for coverage information.  covid-19:declined  Advanced directives: Please bring a copy of your health care power of attorney and living will to the office at your convenience.  Conditions/risks identified: Recommend drinking 6-8 glasses of water per day   Next appointment: Follow up in one year for your annual wellness visit    Preventive Care 65 Years and Older, Female Preventive care refers to lifestyle choices and visits with your health care provider that can promote health and wellness. What does preventive care include?  A yearly physical exam. This is also called an annual well check.  Dental exams once or twice a year.  Routine eye exams. Ask your health care provider how often you should have your eyes checked.  Personal lifestyle choices, including:  Daily care of your teeth and gums.  Regular physical activity.  Eating a healthy diet.  Avoiding tobacco and drug use.  Limiting alcohol use.  Practicing safe sex.  Taking low-dose aspirin every day.  Taking vitamin and mineral supplements as recommended by your health care provider. What happens during an annual well check? The services and screenings done by your health care provider during your annual well check will depend on your age, overall health,  lifestyle risk factors, and family history of disease. Counseling  Your health care provider may ask you questions about your:  Alcohol use.  Tobacco use.  Drug use.  Emotional well-being.  Home and relationship well-being.  Sexual activity.  Eating habits.  History of falls.  Memory and ability to understand (cognition).  Work and work Statistician.  Reproductive health. Screening  You may have the following tests or measurements:  Height, weight, and BMI.  Blood pressure.  Lipid and cholesterol levels. These may be checked every 5 years, or more frequently if you are over 36 years old.  Skin check.  Lung cancer screening. You may have this screening every year starting at age 35 if you have a 30-pack-year history of smoking and currently smoke or have quit within the past 15 years.  Fecal occult blood test (FOBT) of the stool. You may have this test every year starting at age 66.  Flexible sigmoidoscopy or colonoscopy. You may have a sigmoidoscopy every 5 years or a colonoscopy every 10 years starting at age 45.  Hepatitis C blood test.  Hepatitis B blood test.  Sexually transmitted disease (STD) testing.  Diabetes screening. This is done by checking your blood sugar (glucose) after you have not eaten for a while (fasting). You may have this done every 1-3 years.  Bone density scan. This is done to screen for osteoporosis. You may have this done starting at age 37.  Mammogram. This may be done every 1-2 years. Talk to your health care provider about how often you should have regular mammograms. Talk with your health care provider about your test results, treatment options, and if necessary, the need for more tests. Vaccines  Your health care provider  may recommend certain vaccines, such as:  Influenza vaccine. This is recommended every year.  Tetanus, diphtheria, and acellular pertussis (Tdap, Td) vaccine. You may need a Td booster every 10 years.  Zoster  vaccine. You may need this after age 12.  Pneumococcal 13-valent conjugate (PCV13) vaccine. One dose is recommended after age 42.  Pneumococcal polysaccharide (PPSV23) vaccine. One dose is recommended after age 24. Talk to your health care provider about which screenings and vaccines you need and how often you need them. This information is not intended to replace advice given to you by your health care provider. Make sure you discuss any questions you have with your health care provider. Document Released: 04/27/2015 Document Revised: 12/19/2015 Document Reviewed: 01/30/2015 Elsevier Interactive Patient Education  2017 [place] Prevention in the Home Falls can cause injuries. They can happen to people of all ages. There are many things you can do to make your home safe and to help prevent falls. What can I do on the outside of my home?  Regularly fix the edges of walkways and driveways and fix any cracks.  Remove anything that might make you trip as you walk through a door, such as a raised step or threshold.  Trim any bushes or trees on the path to your home.  Use bright outdoor lighting.  Clear any walking paths of anything that might make someone trip, such as rocks or tools.  Regularly check to see if handrails are loose or broken. Make sure that both sides of any steps have handrails.  Any raised decks and porches should have guardrails on the edges.  Have any leaves, snow, or ice cleared regularly.  Use sand or salt on walking paths during winter.  Clean up any spills in your garage right away. This includes oil or grease spills. What can I do in the bathroom?  Use night lights.  Install grab bars by the toilet and in the tub and shower. Do not use towel bars as grab bars.  Use non-skid mats or decals in the tub or shower.  If you need to sit down in the shower, use a plastic, non-slip stool.  Keep the floor dry. Clean up any water that spills on the  floor as soon as it happens.  Remove soap buildup in the tub or shower regularly.  Attach bath mats securely with double-sided non-slip rug tape.  Do not have throw rugs and other things on the floor that can make you trip. What can I do in the bedroom?  Use night lights.  Make sure that you have a light by your bed that is easy to reach.  Do not use any sheets or blankets that are too big for your bed. They should not hang down onto the floor.  Have a firm chair that has side arms. You can use this for support while you get dressed.  Do not have throw rugs and other things on the floor that can make you trip. What can I do in the kitchen?  Clean up any spills right away.  Avoid walking on wet floors.  Keep items that you use a lot in easy-to-reach places.  If you need to reach something above you, use a strong step stool that has a grab bar.  Keep electrical cords out of the way.  Do not use floor polish or wax that makes floors slippery. If you must use wax, use non-skid floor wax.  Do not have throw rugs  and other things on the floor that can make you trip. What can I do with my stairs?  Do not leave any items on the stairs.  Make sure that there are handrails on both sides of the stairs and use them. Fix handrails that are broken or loose. Make sure that handrails are as long as the stairways.  Check any carpeting to make sure that it is firmly attached to the stairs. Fix any carpet that is loose or worn.  Avoid having throw rugs at the top or bottom of the stairs. If you do have throw rugs, attach them to the floor with carpet tape.  Make sure that you have a light switch at the top of the stairs and the bottom of the stairs. If you do not have them, ask someone to add them for you. What else can I do to help prevent falls?  Wear shoes that:  Do not have high heels.  Have rubber bottoms.  Are comfortable and fit you well.  Are closed at the toe. Do not wear  sandals.  If you use a stepladder:  Make sure that it is fully opened. Do not climb a closed stepladder.  Make sure that both sides of the stepladder are locked into place.  Ask someone to hold it for you, if possible.  Clearly mark and make sure that you can see:  Any grab bars or handrails.  First and last steps.  Where the edge of each step is.  Use tools that help you move around (mobility aids) if they are needed. These include:  Canes.  Walkers.  Scooters.  Crutches.  Turn on the lights when you go into a dark area. Replace any light bulbs as soon as they burn out.  Set up your furniture so you have a clear path. Avoid moving your furniture around.  If any of your floors are uneven, fix them.  If there are any pets around you, be aware of where they are.  Review your medicines with your doctor. Some medicines can make you feel dizzy. This can increase your chance of falling. Ask your doctor what other things that you can do to help prevent falls. This information is not intended to replace advice given to you by your health care provider. Make sure you discuss any questions you have with your health care provider. Document Released: 01/25/2009 Document Revised: 09/06/2015 Document Reviewed: 05/05/2014 Elsevier Interactive Patient Education  2017 [employer].

## 2020-02-09 NOTE — Progress Notes (Signed)
Subjective:   Abigail Hatfield is a 74 y.o. female who presents for Medicare Annual (Subsequent) preventive examination.  Virtual Visit via Telephone Note  I connected with  [name] on 02/09/20 at 10:40 AM EDT by telephone and verified that I am speaking with the correct person using two identifiers.  Medicare Annual Wellness visit completed telephonically due to Covid-19 pandemic.   Location: Patient: home Provider: [place]   I discussed the limitations, risks, security and privacy concerns of performing an evaluation and management service by telephone and the availability of in person appointments. The patient expressed understanding and agreed to proceed.  Unable to perform video visit due to video visit attempted and failed and/or patient does not have video capability.   Some vital signs may be absent or patient reported.   Clemetine Marker, LPN    Review of Systems     Cardiac Risk Factors include: advanced age (>2men, >63 women);dyslipidemia;hypertension     Objective:    There were no vitals filed for this visit. There is no height or weight on file to calculate BMI.  Advanced Directives 02/09/2020 12/07/2018 06/06/2018 12/24/2017 12/08/2017 07/14/2017 06/24/2017  Does Patient Have a Medical Advance Directive? Yes Yes No Yes Yes Yes Yes  Type of Paramedic of [place];Living will [place];Living will - [place];Living will [place];Living will [place];Living will [place];Living will  Does patient want to make changes to medical advance directive? - - - - - No - Patient declined No - Patient declined  Copy of [place] in Chart? No - copy requested No - copy requested - Yes No - copy requested No - copy requested No - copy requested  Would patient like information on creating a medical advance directive? - - No - Patient declined  No - Patient declined - - -    Current Medications (verified) Outpatient Encounter Medications as of 02/09/2020  Medication Sig  . Cholecalciferol (VITAMIN D3) 5000 UNITS CAPS Take 5,000 Units by mouth every other day.   . Evening Primrose Oil 1000 MG CAPS Take 3 capsules by mouth daily.  Marland Kitchen gabapentin (NEURONTIN) 100 MG capsule Take 1-2 capsules (100-200 mg total) by mouth 2 (two) times daily.  Marland Kitchen OVER THE COUNTER MEDICATION 1 capsule 2 (two) times daily.   Marland Kitchen pyridoxine (B-6) 100 MG tablet Take 100 mg by mouth daily.  . simvastatin (ZOCOR) 20 MG tablet TAKE ONE TABLET BY MOUTH AT BEDTIME  . vitamin B-12 (CYANOCOBALAMIN) 1000 MCG tablet Take 1,000 mcg by mouth daily.  Marland Kitchen conjugated estrogens (PREMARIN) vaginal cream Place 1 Applicatorful vaginally daily. One pea size on introitus (Patient not taking: Reported on 02/09/2020)   No facility-administered encounter medications on file as of 02/09/2020.    Allergies (verified) Moxifloxacin hcl in nacl, Prednisone, and Penicillins   History: Past Medical History:  Diagnosis Date  . Allergy   . Cataract   . Hyperlipidemia   . Medical history non-contributory    Past Surgical History:  Procedure Laterality Date  . ABDOMINAL HYSTERECTOMY  1980  . CATARACT EXTRACTION W/PHACO Right 06/24/2017   Procedure: CATARACT EXTRACTION PHACO AND INTRAOCULAR LENS PLACEMENT (IOC);  Surgeon: Birder Robson, MD;  Location: [facility] ORS;  Service: Ophthalmology;  Laterality: Right;  [place] 00:48.0 AP% 16.5 CDE 7.92 Fluid Pack Lot # [id number]  . CATARACT EXTRACTION W/PHACO Left 07/14/2017   Procedure: CATARACT EXTRACTION PHACO AND INTRAOCULAR LENS PLACEMENT (  [place]);  Surgeon: Birder Robson, MD;  Location: [facility] ORS;  Service: Ophthalmology;  Laterality: Left;  [place] 00:36.0 AP% 16.9 CDE 6.08 Fluid Pack Lot # [id number] [place]  . CESAREAN SECTION  1971  . CHOLECYSTECTOMY  1972  . COLONOSCOPY WITH PROPOFOL N/A 12/24/2017   Procedure: COLONOSCOPY  WITH PROPOFOL;  Surgeon: Jonathon Bellows, MD;  Location: [facility] ENDOSCOPY;  Service: Gastroenterology;  Laterality: N/A;  . EYE SURGERY    . retal fistula repair     Dr. Jamal Collin x 2   Family History  Problem Relation Age of Onset  . Diabetes Mother   . Hypertension Mother   . Heart attack Mother   . Heart failure Father   . Heart disease Father   . Diabetes Brother   . Hypertension Brother   . COPD Brother   . Diabetes Sister   . Breast cancer Neg Hx    Social History   Socioeconomic History  . Marital status: Married    Spouse name: Percell Miller  . Number of children: 2  . Years of education: some college  . Highest education level: 12th grade  Occupational History  . Occupation: Retired    Comment: since 04/2008  Tobacco Use  . Smoking status: Former Smoker    Packs/day: 0.50    Years: 5.00    Pack years: 2.50    Types: Cigarettes    Quit date: 12/07/1988    Years since quitting: 31.1  . Smokeless tobacco: Never Used  . Tobacco comment: smoking cessation materials not required  Vaping Use  . Vaping Use: Never used  Substance and Sexual Activity  . Alcohol use: No    Alcohol/week: 0.0 standard drinks  . Drug use: No  . Sexual activity: Not Currently    Partners: Male    Birth control/protection: None    Comment: patient's husband has prostate issues so she cannot with him. has not in 11yrs.  Other Topics Concern  . Not on file  Social History Narrative   Mother is 54yrs old that has dementia, she has a Actuary but still checks on her       Husband has prostate issues and early dementia, he has been losing his patience and it has been stressful       Patient has 7 deteriorated disc   Social Determinants of Health   Financial Resource Strain: Low Risk   . Difficulty of Paying Living Expenses: Not hard at all  Food Insecurity: No Food Insecurity  . Worried About Charity fundraiser in the Last Year: Never true  . Ran Out of Food in the Last Year: Never true    Transportation Needs: No Transportation Needs  . Lack of Transportation (Medical): No  . Lack of Transportation (Non-Medical): No  Physical Activity: Inactive  . Days of Exercise per Week: 0 days  . Minutes of Exercise per Session: 0 min  Stress: Stress Concern Present  . Feeling of Stress : Rather much  Social Connections: Moderately Integrated  . Frequency of Communication with Friends and Family: More than three times a week  . Frequency of Social Gatherings with Friends and Family: Once a week  . Attends Religious Services: More than 4 times per year  . Active Member of Clubs or Organizations: No  . Attends Archivist Meetings: Never  . Marital Status: Married    Tobacco Counseling Counseling given: Not Answered Comment: smoking cessation materials not required   Clinical Intake:  Pre-visit preparation completed: Yes  Pain : No/denies pain     Nutritional Risks: None Diabetes: No  How often do you need to have someone help you when you read instructions, pamphlets, or other written materials from your doctor or pharmacy?: 1 - Never    Interpreter Needed?: No  Information entered by :: Clemetine Marker LPN   Activities of Daily Living In your present state of health, do you have any difficulty performing the following activities: 02/09/2020 01/20/2020  Hearing? N N  Comment declines hearing aids -  Vision? N N  Difficulty concentrating or making decisions? N N  Walking or climbing stairs? N Y  Dressing or bathing? N N  Doing errands, shopping? N N  Preparing Food and eating ? N -  Using the Toilet? N -  In the past six months, have you accidently leaked urine? N -  Do you have problems with loss of bowel control? N -  Managing your Medications? N -  Managing your Finances? N -  Housekeeping or managing your Housekeeping? N -  Some recent data might be hidden    Patient Care Team: Steele Sizer, MD as PCP - General (Family Medicine) Birder Robson, MD as Consulting Physician (Ophthalmology) Ralene Bathe, MD as Consulting Physician (Dermatology) Cathi Roan, [facility] (Pharmacist)  Indicate any recent Medical Services you may have received from other than [facility] providers in the past year (date may be approximate).     Assessment:   This is a routine wellness examination for [employer].  Hearing/Vision screen  Hearing Screening   125Hz  250Hz  500Hz  1000Hz  2000Hz  3000Hz  4000Hz  6000Hz  8000Hz   Right ear:           Left ear:           Comments: Pt denies hearing difficulty  Vision Screening Comments: Annual vision screenings done at [facility] Dr. Michelene Heady  Dietary issues and exercise activities discussed: Current Exercise Habits: The patient does not participate in regular exercise at present, Exercise limited by: orthopedic condition(s)  Goals    .  DIET - INCREASE WATER INTAKE      Recommend to drink at least 6-8 8oz glasses of water per day.    .  Lower cholesterol (pt-stated)      Current Barriers:  Marland Kitchen Knowledge Deficits related to basic understanding of hyperlipidemia pathophysiology and self care management . Knowledge Deficits related to understanding of medications prescribed for management of hyperlipidemia  Clinical Pharmacist Goal(s):  Marland Kitchen Over the next 30 days, patient will verbalize understanding of plan for cholesterol management, through diet, exercise, and prescription medication management (statin)  Interventions:  . Counseling on pathophysiology of high cholesterol . Counseling on diet and exercise and the effects on cholesterol . Education on the importance of statins and the benefits of statin over red yeast rice   Patient Self Care Activities:  . Calls provider office for new concerns or questions  Initial goal documentation        Depression Screen PHQ 2/9 Scores 02/09/2020 01/20/2020 12/16/2019 08/10/2019 12/23/2018 12/14/2018 12/07/2018  PHQ - 2 Score 0 0 0 0 0 0 0  PHQ- 9 Score - - -  0 - 0 -    Fall Risk Fall Risk  02/09/2020 01/20/2020 12/16/2019 08/10/2019 12/14/2018  Falls in the past year? 0 0 0 0 0  Number falls in past yr: 0 0 0 0 0  Injury with Fall? 0 0 0 0 0  Risk for fall due to :  No Fall Risks - - - -  Risk for fall due to: Comment - - - - -  Follow up Falls prevention discussed - - - -    Any stairs in or around the home? Yes  If so, are there any without handrails? No  Home free of loose throw rugs in walkways, pet beds, electrical cords, etc? Yes  Adequate lighting in your home to reduce risk of falls? Yes   ASSISTIVE DEVICES UTILIZED TO PREVENT FALLS:  Life alert? No  Use of a cane, [place] or w/c? No  Grab bars in the bathroom? No  Shower chair or bench in shower? Yes  Elevated toilet seat or a handicapped toilet? No   TIMED UP AND GO:  Was the test performed? No . Telephonic visit.   Cognitive Function: pt declined 6CIT for 2021 AWV; states no memory issues.      6CIT Screen 12/07/2018 12/08/2017  What Year? 0 points 0 points  What month? 0 points 0 points  What time? 0 points 0 points  Count back from 20 0 points 0 points  Months in reverse 0 points 0 points  Repeat phrase 0 points 0 points  Total Score 0 0    Immunizations There is no immunization history for the selected administration types on file for this patient.  TDAP status: Up to date   Flu Vaccine status: Declined, Education has been provided regarding the importance of this vaccine but patient still declined. Advised may receive this vaccine at local pharmacy or Health Dept. Aware to provide a copy of the vaccination record if obtained from local pharmacy or Health Dept. Verbalized acceptance and understanding.   Pneumococcal vaccine status: Declined,  Education has been provided regarding the importance of this vaccine but patient still declined. Advised may receive this vaccine at local pharmacy or Health Dept. Aware to provide a copy of the vaccination record if obtained  from local pharmacy or Health Dept. Verbalized acceptance and understanding.    Covid-19 vaccine status: Declined, Education has been provided regarding the importance of this vaccine but patient still declined. Advised may receive this vaccine at local pharmacy or Health Dept.or vaccine clinic. Aware to provide a copy of the vaccination record if obtained from local pharmacy or Health Dept. Verbalized acceptance and understanding.  Qualifies for Shingles Vaccine? Yes   Zostavax completed No   Shingrix Completed?: No.    Education has been provided regarding the importance of this vaccine. Patient has been advised to call insurance company to determine out of pocket expense if they have not yet received this vaccine. Advised may also receive vaccine at local pharmacy or Health Dept. Verbalized acceptance and understanding.  Screening Tests Health Maintenance  Topic Date Due  . COVID-19 Vaccine (1) Never done  . INFLUENZA VACCINE  07/12/2020 (Originally 11/13/2019)  . TETANUS/TDAP  12/15/2020 (Originally 12/03/1964)  . PNA vac Low Risk Adult (1 of 2 - PCV13) 12/15/2020 (Originally 12/02/2009)  . MAMMOGRAM  01/25/2021  . COLONOSCOPY  12/25/2022  . DEXA SCAN  Completed  . Hepatitis C Screening  Completed    Health Maintenance  Health Maintenance Due  Topic Date Due  . COVID-19 Vaccine (1) Never done    Colorectal cancer screening: Completed 12/24/17. Repeat every 5 years   Mammogram status: Completed 01/26/20. Repeat every year   Bone Density status: Completed 01/11/18. Results reflect: Bone density results: NORMAL. Repeat every 2 years. pt declines repeat screening at this time.   Lung Cancer Screening: (  Low Dose CT Chest recommended if Age 67-80 years, 30 pack-year currently smoking OR have quit w/in 15years.) does not qualify.   Additional Screening:  Hepatitis C Screening: does qualify; Completed 11/26/16.   Vision Screening: Recommended annual ophthalmology exams for early  detection of glaucoma and other disorders of the eye. Is the patient up to date with their annual eye exam?  Yes  Who is the provider or what is the name of the office in which the patient attends annual eye exams? [place] Screening: Recommended annual dental exams for proper oral hygiene  Community Resource Referral / Chronic Care Management: CRR required this visit?  No   CCM required this visit?  No      Plan:     I have personally reviewed and noted the following in the patient's chart:   . Medical and social history . Use of alcohol, tobacco or illicit drugs  . Current medications and supplements . Functional ability and status . Nutritional status . Physical activity . Advanced directives . List of other physicians . Hospitalizations, surgeries, and ER visits in previous 12 months . Vitals . Screenings to include cognitive, depression, and falls . Referrals and appointments  In addition, I have reviewed and discussed with patient certain preventive protocols, quality metrics, and best practice recommendations. A written personalized care plan for preventive services as well as general preventive health recommendations were provided to patient.     Clemetine Marker, LPN   20/80/2233   Nurse Notes: none

## 2020-02-15 DIAGNOSIS — M9901 Segmental and somatic dysfunction of cervical region: Secondary | ICD-10-CM | POA: Diagnosis not present

## 2020-02-15 DIAGNOSIS — M5033 Other cervical disc degeneration, cervicothoracic region: Secondary | ICD-10-CM | POA: Diagnosis not present

## 2020-02-15 DIAGNOSIS — M62830 Muscle spasm of back: Secondary | ICD-10-CM | POA: Diagnosis not present

## 2020-02-15 DIAGNOSIS — M9903 Segmental and somatic dysfunction of lumbar region: Secondary | ICD-10-CM | POA: Diagnosis not present

## 2020-03-03 ENCOUNTER — Other Ambulatory Visit: Payer: Self-pay | Admitting: Family Medicine

## 2020-03-03 DIAGNOSIS — M5410 Radiculopathy, site unspecified: Secondary | ICD-10-CM

## 2020-03-21 DIAGNOSIS — M5033 Other cervical disc degeneration, cervicothoracic region: Secondary | ICD-10-CM | POA: Diagnosis not present

## 2020-03-21 DIAGNOSIS — M62830 Muscle spasm of back: Secondary | ICD-10-CM | POA: Diagnosis not present

## 2020-03-21 DIAGNOSIS — M9901 Segmental and somatic dysfunction of cervical region: Secondary | ICD-10-CM | POA: Diagnosis not present

## 2020-03-21 DIAGNOSIS — M9903 Segmental and somatic dysfunction of lumbar region: Secondary | ICD-10-CM | POA: Diagnosis not present

## 2020-05-02 DIAGNOSIS — M9901 Segmental and somatic dysfunction of cervical region: Secondary | ICD-10-CM | POA: Diagnosis not present

## 2020-05-02 DIAGNOSIS — M5033 Other cervical disc degeneration, cervicothoracic region: Secondary | ICD-10-CM | POA: Diagnosis not present

## 2020-05-02 DIAGNOSIS — M9903 Segmental and somatic dysfunction of lumbar region: Secondary | ICD-10-CM | POA: Diagnosis not present

## 2020-05-02 DIAGNOSIS — M62830 Muscle spasm of back: Secondary | ICD-10-CM | POA: Diagnosis not present

## 2020-05-14 ENCOUNTER — Other Ambulatory Visit: Payer: Self-pay | Admitting: Family Medicine

## 2020-05-14 DIAGNOSIS — M5410 Radiculopathy, site unspecified: Secondary | ICD-10-CM

## 2020-05-15 NOTE — Telephone Encounter (Signed)
Called patient she is taking 1 capsule at night.

## 2020-05-25 DIAGNOSIS — H43813 Vitreous degeneration, bilateral: Secondary | ICD-10-CM | POA: Diagnosis not present

## 2020-05-30 DIAGNOSIS — M5033 Other cervical disc degeneration, cervicothoracic region: Secondary | ICD-10-CM | POA: Diagnosis not present

## 2020-05-30 DIAGNOSIS — M62830 Muscle spasm of back: Secondary | ICD-10-CM | POA: Diagnosis not present

## 2020-05-30 DIAGNOSIS — M9901 Segmental and somatic dysfunction of cervical region: Secondary | ICD-10-CM | POA: Diagnosis not present

## 2020-05-30 DIAGNOSIS — M9903 Segmental and somatic dysfunction of lumbar region: Secondary | ICD-10-CM | POA: Diagnosis not present

## 2020-06-27 DIAGNOSIS — M9903 Segmental and somatic dysfunction of lumbar region: Secondary | ICD-10-CM | POA: Diagnosis not present

## 2020-06-27 DIAGNOSIS — M5033 Other cervical disc degeneration, cervicothoracic region: Secondary | ICD-10-CM | POA: Diagnosis not present

## 2020-06-27 DIAGNOSIS — M62830 Muscle spasm of back: Secondary | ICD-10-CM | POA: Diagnosis not present

## 2020-06-27 DIAGNOSIS — M9901 Segmental and somatic dysfunction of cervical region: Secondary | ICD-10-CM | POA: Diagnosis not present

## 2020-07-25 DIAGNOSIS — M5033 Other cervical disc degeneration, cervicothoracic region: Secondary | ICD-10-CM | POA: Diagnosis not present

## 2020-07-25 DIAGNOSIS — M9903 Segmental and somatic dysfunction of lumbar region: Secondary | ICD-10-CM | POA: Diagnosis not present

## 2020-07-25 DIAGNOSIS — M62830 Muscle spasm of back: Secondary | ICD-10-CM | POA: Diagnosis not present

## 2020-07-25 DIAGNOSIS — M9901 Segmental and somatic dysfunction of cervical region: Secondary | ICD-10-CM | POA: Diagnosis not present

## 2020-08-15 ENCOUNTER — Other Ambulatory Visit: Payer: Self-pay | Admitting: Family Medicine

## 2020-08-15 DIAGNOSIS — M5410 Radiculopathy, site unspecified: Secondary | ICD-10-CM

## 2020-08-22 DIAGNOSIS — M9903 Segmental and somatic dysfunction of lumbar region: Secondary | ICD-10-CM | POA: Diagnosis not present

## 2020-08-22 DIAGNOSIS — M5033 Other cervical disc degeneration, cervicothoracic region: Secondary | ICD-10-CM | POA: Diagnosis not present

## 2020-08-22 DIAGNOSIS — M62830 Muscle spasm of back: Secondary | ICD-10-CM | POA: Diagnosis not present

## 2020-08-22 DIAGNOSIS — M9901 Segmental and somatic dysfunction of cervical region: Secondary | ICD-10-CM | POA: Diagnosis not present

## 2020-08-31 ENCOUNTER — Telehealth: Payer: Self-pay

## 2020-08-31 NOTE — Telephone Encounter (Signed)
Copied from [place] [id number]. Topic: General - Other >> Aug 31, 2020  8:04 AM Leward Quan A wrote: Reason for CRM: Patient called in to inquire of Dr Ancil Boozer to request something for her nerves taking care of her mom and husband who has dementia. So just need something to keep her mind calm and help her cope with the situation at home. Have scheduled a future appointment Please advise Ph#  [phone]

## 2020-08-31 NOTE — Telephone Encounter (Signed)
appt sch'd for 5.25.2022

## 2020-09-04 NOTE — Progress Notes (Signed)
Name: Abigail Hatfield   MRN: [id number]    DOB: Apr 28, 1945   Date:09/05/2020       Progress Note  Subjective  Chief Complaint  Acute visit for crying episodes  HPI   Right leg pain: she states she noticed pain on lower leg over the past 2 months, yesterday it was the entire anterior leg including her knee, it is like a toothache, she states sometimes she feels like her leg will give out. She states she can not alternate legs when going up or down stairs, has to support with left leg, she feels like it will give it up. She sees chiropractor monthly for her chronic back pain, she has DDD. She denies bowel or bladder incontinence. No burning or tingling on leg  Dysthymia:  she continues to fee stressed, her husband has  dementia is progressing, he loses his temper quickly, he screams and gets upset easily . She is no longer mother's caregiver, her aunt stays with her mother 6 months of the year and she is still here, but in June she will have to be more involved when her sister goes back home.Marland Kitchen Her mother is 76 yo and also has  dementia. She is also very worried about her cat that is 60 yo and has diabetes.and kidney disease. She is crying and has lack of motivation . She also lost her brother in Nov, she states from a stroke but she is still grieving, discussed grieve counseling   Hyperlipidemia: she is taking simvastatin now and last LDL is down to 93, no side effects of medication. Continue medication   Hyperglycemia: on labs done at [facility], denies polyphagia, polydipsia or polyuria. Last A1C was normal at 5.5 %   Senile purpura: on both arms, reassurance given again   Chronic back pain: she sees a chiropractor, has daily pain, from cervical spine to lumbar spine, uses tens units to control symptoms, no radiculitis at this time  She has been taking gabapentin qhs and seems to help   B12 deficiency: continue supplementation, denies fatigue, unchanged   Recent tooth extraction and is taking  azithromycin    Patient Active Problem List   Diagnosis Date Noted  . Obesity (BMI 30.0-34.9) 12/07/2017  . Degenerative joint disease of low back 12/07/2017  . Senile purpura ([place]) 12/07/2017  . Hyperlipidemia 12/21/2014    Past Surgical History:  Procedure Laterality Date  . ABDOMINAL HYSTERECTOMY  1980  . CATARACT EXTRACTION W/PHACO Right 06/24/2017   Procedure: CATARACT EXTRACTION PHACO AND INTRAOCULAR LENS PLACEMENT (IOC);  Surgeon: Birder Robson, MD;  Location: [facility] ORS;  Service: Ophthalmology;  Laterality: Right;  [place] 00:48.0 AP% 16.5 CDE 7.92 Fluid Pack Lot # [id number]  . CATARACT EXTRACTION W/PHACO Left 07/14/2017   Procedure: CATARACT EXTRACTION PHACO AND INTRAOCULAR LENS PLACEMENT (IOC);  Surgeon: Birder Robson, MD;  Location: [facility] ORS;  Service: Ophthalmology;  Laterality: Left;  [place] 00:36.0 AP% 16.9 CDE 6.08 Fluid Pack Lot # [id number] [place]  . CESAREAN SECTION  1971  . CHOLECYSTECTOMY  1972  . COLONOSCOPY WITH PROPOFOL N/A 12/24/2017   Procedure: COLONOSCOPY WITH PROPOFOL;  Surgeon: Jonathon Bellows, MD;  Location[facility] ENDOSCOPY;  Service: Gastroenterology;  Laterality: N/A;  . EYE SURGERY    . retal fistula repair     Dr. Jamal Collin x 2    Family History  Problem Relation Age of Onset  . Diabetes Mother   . Hypertension Mother   . Heart attack Mother   .  Heart failure Father   . Heart disease Father   . Diabetes Brother   . Hypertension Brother   . COPD Brother   . Diabetes Sister   . Breast cancer Neg Hx     Social History   Tobacco Use  . Smoking status: Former Smoker    Packs/day: 0.50    Years: 5.00    Pack years: 2.50    Types: Cigarettes    Quit date: 12/07/1988    Years since quitting: 31.7  . Smokeless tobacco: Never Used  . Tobacco comment: smoking cessation materials not required  Substance Use Topics  . Alcohol use: No    Alcohol/week: 0.0 standard drinks     Current Outpatient Medications:  .  azithromycin  (ZITHROMAX) 500 MG tablet, Take 500 mg by mouth daily., Disp: , Rfl:  .  Cholecalciferol (VITAMIN D3) 5000 UNITS CAPS, Take 5,000 Units by mouth every other day. , Disp: , Rfl:  .  conjugated estrogens (PREMARIN) vaginal cream, Place 1 Applicatorful vaginally daily. One pea size on introitus, Disp: 42.5 g, Rfl: 12 .  gabapentin (NEURONTIN) 100 MG capsule, TAKE 1 CAPSULE AT BEDTIME, Disp: 90 capsule, Rfl: 1 .  IBU 600 MG tablet, Take 600 mg by mouth every 6 (six) hours as needed., Disp: , Rfl:  .  OVER THE COUNTER MEDICATION, 1 capsule 2 (two) times daily. , Disp: , Rfl:  .  pyridoxine (B-6) 100 MG tablet, Take 100 mg by mouth daily., Disp: , Rfl:  .  simvastatin (ZOCOR) 20 MG tablet, TAKE ONE TABLET BY MOUTH AT BEDTIME, Disp: 90 tablet, Rfl: 2 .  vitamin B-12 (CYANOCOBALAMIN) 1000 MCG tablet, Take 1,000 mcg by mouth daily., Disp: , Rfl:  .  Evening Primrose Oil 1000 MG CAPS, Take 3 capsules by mouth daily. (Patient not taking: Reported on 09/05/2020), Disp: , Rfl:   Allergies  Allergen Reactions  . Moxifloxacin Hcl In Nacl Swelling  . Prednisone Other (See Comments)    Paranoid  . Penicillins Rash and Other (See Comments)    Has patient had a PCN reaction causing immediate rash, facial/tongue/throat swelling, SOB or lightheadedness with hypotension: Unknown Has patient had a PCN reaction causing severe rash involving mucus membranes or skin necrosis: No Has patient had a PCN reaction that required hospitalization: Yes Has patient had a PCN reaction occurring within the last 10 years: No If all of the above answers are "NO", then may proceed with Cephalosporin use.     I personally reviewed active problem list, medication list, allergies, family history, social history, health maintenance with the patient/caregiver today.   ROS  Constitutional: Negative for fever or weight change.  Respiratory: Negative for cough and shortness of breath.   Cardiovascular: Negative for chest pain or  palpitations.  Gastrointestinal: Negative for abdominal pain, no bowel changes.  Musculoskeletal: Negative for gait problem or joint swelling.  Skin: Negative for rash.  Neurological: Negative for dizziness or headache.  No other specific complaints in a complete review of systems (except as listed in HPI above).  Objective  Vitals:   09/05/20 1140  BP: 116/76  Pulse: 75  Resp: 16  Temp: 98.1 F (36.7 C)  TempSrc: Oral  SpO2: 98%  Weight: 163 lb 8 oz (74.2 kg)    Body mass index is 31.93 kg/m.  Physical Exam  Constitutional: Patient appears well-developed and well-nourished. Obese  No distress.  HEENT: head atraumatic, normocephalic, pupils equal and reactive to light, neck supple Cardiovascular: Normal rate, regular rhythm  and normal heart sounds.  No murmur heard. No BLE edema. Pulmonary/Chest: Effort normal and breath sounds normal. No respiratory distress. Abdominal: Soft.  There is no tenderness. Psychiatric: Patient has a normal mood and affect. behavior is normal. Judgment and thought content normal.  PHQ2/9: Depression screen [facility] 2/9 09/05/2020 02/09/2020 01/20/2020 12/16/2019 08/10/2019  Decreased Interest 0 0 0 0 0  Down, Depressed, Hopeless 0 0 0 0 0  PHQ - 2 Score 0 0 0 0 0  Altered sleeping 0 - - - 0  Tired, decreased energy 0 - - - 0  Change in appetite 0 - - - 0  Feeling bad or failure about yourself  0 - - - 0  Trouble concentrating 0 - - - 0  Moving slowly or fidgety/restless 0 - - - 0  Suicidal thoughts 0 - - - 0  PHQ-9 Score 0 - - - 0  Difficult doing work/chores - - - - Not difficult at all    phq 9 is positive - she was crying, she worries, feels overwhelmed    Fall Risk: Fall Risk  09/05/2020 02/09/2020 01/20/2020 12/16/2019 08/10/2019  Falls in the past year? 0 0 0 0 0  Number falls in past yr: 0 0 0 0 0  Injury with Fall? 0 0 0 0 0  Risk for fall due to : - No Fall Risks - - -  Risk for fall due to: Comment - - - - -  Follow up Falls prevention  discussed Falls prevention discussed - - -     Functional Status Survey: Is the patient deaf or have difficulty hearing?: No Does the patient have difficulty seeing, even when wearing glasses/contacts?: No Does the patient have difficulty concentrating, remembering, or making decisions?: No Does the patient have difficulty walking or climbing stairs?: No Does the patient have difficulty dressing or bathing?: No Does the patient have difficulty doing errands alone such as visiting a doctor's office or shopping?: No    Assessment & Plan  1. Pure hypercholesterolemia  Continue Atorvastatin   2. Chronic back pain, lumbar spine   Keep follow up with Dr. Joyce Copa   3. Senile purpura (HCC)  stable  4. Low serum vitamin B12  Continue supplementation   5. DDD (degenerative disc disease), lumbosacral   6. Hyperglycemia  Reviewed labs   7. Radicular syndrome of right leg  Taking gabapentin and is doing well   8. Oral infection  Keep follow up with oral surgeon    9. Dysthymia  She initially  refused medication, but after I left the room she asked Katharine Look about medication for her sadness, sent zoloft 50 mg , recheck in 6 weeks

## 2020-09-05 ENCOUNTER — Encounter: Payer: Self-pay | Admitting: Family Medicine

## 2020-09-05 ENCOUNTER — Other Ambulatory Visit: Payer: Self-pay

## 2020-09-05 ENCOUNTER — Ambulatory Visit (INDEPENDENT_AMBULATORY_CARE_PROVIDER_SITE_OTHER): Payer: PPO | Admitting: Family Medicine

## 2020-09-05 VITALS — BP 116/76 | HR 75 | Temp 98.1°F | Resp 16 | Ht 60.0 in | Wt 163.5 lb

## 2020-09-05 DIAGNOSIS — M5410 Radiculopathy, site unspecified: Secondary | ICD-10-CM

## 2020-09-05 DIAGNOSIS — E7800 Pure hypercholesterolemia, unspecified: Secondary | ICD-10-CM | POA: Diagnosis not present

## 2020-09-05 DIAGNOSIS — K122 Cellulitis and abscess of mouth: Secondary | ICD-10-CM | POA: Diagnosis not present

## 2020-09-05 DIAGNOSIS — G8929 Other chronic pain: Secondary | ICD-10-CM | POA: Diagnosis not present

## 2020-09-05 DIAGNOSIS — D692 Other nonthrombocytopenic purpura: Secondary | ICD-10-CM | POA: Diagnosis not present

## 2020-09-05 DIAGNOSIS — E538 Deficiency of other specified B group vitamins: Secondary | ICD-10-CM | POA: Diagnosis not present

## 2020-09-05 DIAGNOSIS — R739 Hyperglycemia, unspecified: Secondary | ICD-10-CM | POA: Diagnosis not present

## 2020-09-05 DIAGNOSIS — M545 Low back pain, unspecified: Secondary | ICD-10-CM

## 2020-09-05 DIAGNOSIS — M5137 Other intervertebral disc degeneration, lumbosacral region: Secondary | ICD-10-CM | POA: Diagnosis not present

## 2020-09-05 DIAGNOSIS — F341 Dysthymic disorder: Secondary | ICD-10-CM | POA: Insufficient documentation

## 2020-09-05 MED ORDER — SERTRALINE HCL 50 MG PO TABS: 50.0000 mg | ORAL_TABLET | Freq: Every day | ORAL | 2 refills | Status: DC

## 2020-09-06 ENCOUNTER — Telehealth: Payer: Self-pay

## 2020-09-06 NOTE — Telephone Encounter (Signed)
Copied from [place] [id number]. Topic: General - Other >> Sep 06, 2020  8:05 AM Leward Quan A wrote: Reason for CRM: Patient called in to inform Dr Ancil Boozer that the sertraline (ZOLOFT) 50 MG tablet is too strong and that she would like a different medication say that she will not take anymore because the one she took was too much made her too calm. Say that she read the paperwork from the pharmacy and does not want that particular drug. Please call Ph# [phone]

## 2020-09-07 NOTE — Telephone Encounter (Signed)
Patient called in stating she wants to go ahead and split the pill in half, and wanted to let PCP know. Please advise

## 2020-09-25 ENCOUNTER — Other Ambulatory Visit: Payer: Self-pay | Admitting: Family Medicine

## 2020-09-25 NOTE — Progress Notes (Signed)
Name: Abigail Hatfield   MRN: [id number]    DOB: 06-21-1945   Date:09/26/2020       Progress Note  Subjective  Chief Complaint  Follow up   I connected with  [name] on 09/26/20 at  9:40 AM EDT by telephone and verified that I am speaking with the correct person using two identifiers.  I discussed the limitations, risks, security and privacy concerns of performing an evaluation and management service by telephone and the availability of in person appointments. Staff also discussed with the patient that there may be a patient responsible charge related to this service. Patient agreed on having a virtual visit  Patient Location:  mother's house  Provider Location: [facility]  Additional Individuals present: alone   HPI    Dysthymia:  we started her on Zoloft 50 mg last visit, she states she was feeling groggy on 50 mg and decreased dose to 25 mg. It is working well for her. She has noticed her mood is better, also less snappy. She still goes to bed late but has restful sleep. Denies suicidal thoughts or ideation    Patient Active Problem List   Diagnosis Date Noted   Dysthymia 09/05/2020   Hyperglycemia 09/05/2020   Low serum vitamin B12 09/05/2020   Chronic bilateral low back pain without sciatica 09/05/2020   Obesity (BMI 30.0-34.9) 12/07/2017   Degenerative joint disease of low back 12/07/2017   Senile purpura ([place]) 12/07/2017   Hyperlipidemia 12/21/2014    Past Surgical History:  Procedure Laterality Date   ABDOMINAL HYSTERECTOMY  1980   CATARACT EXTRACTION W/PHACO Right 06/24/2017   Procedure: CATARACT EXTRACTION PHACO AND INTRAOCULAR LENS PLACEMENT ([place]);  Surgeon: Birder Robson, MD;  Location: [facility] ORS;  Service: Ophthalmology;  Laterality: Right;  [place] 00:48.0 AP% 16.5 CDE 7.92 Fluid Pack Lot # [id number] [place]   CATARACT EXTRACTION W/PHACO Left 07/14/2017   Procedure: CATARACT EXTRACTION PHACO AND INTRAOCULAR LENS PLACEMENT (IOC);  Surgeon: Birder Robson, MD;   Location: [facility] ORS;  Service: Ophthalmology;  Laterality: Left;  [place] 00:36.0 AP% 16.9 CDE 6.08 Fluid Pack Lot # [id number] [place]   CESAREAN SECTION  1967   CESAREAN SECTION  1971   CHOLECYSTECTOMY  1972   COLONOSCOPY WITH PROPOFOL N/A 12/24/2017   Procedure: COLONOSCOPY WITH PROPOFOL;  Surgeon: Jonathon Bellows, MD;  [facility] ENDOSCOPY;  Service: Gastroenterology;  Laterality: N/A;   EYE SURGERY     retal fistula repair     Dr. Jamal Collin x 2    Family History  Problem Relation Age of Onset   Diabetes Mother    Hypertension Mother    Heart attack Mother    Heart failure Father    Heart disease Father    Diabetes Brother    Hypertension Brother    COPD Brother    Diabetes Sister    Breast cancer Neg Hx     Social History   Socioeconomic History   Marital status: Married    Spouse name: Percell Miller   Number of children: 2   Years of education: some college   Highest education level: 12th grade  Occupational History   Occupation: Retired    Comment: since 04/2008  Tobacco Use   Smoking status: Former    Packs/day: 0.50    Years: 5.00    Pack years: 2.50    Types: Cigarettes    Quit date: 12/07/1988    Years since quitting: 31.8   Smokeless tobacco: Never   Tobacco comments:    smoking  cessation materials not required  Vaping Use   Vaping Use: Never used  Substance and Sexual Activity   Alcohol use: No    Alcohol/week: 0.0 standard drinks   Drug use: No   Sexual activity: Not Currently    Partners: Male    Birth control/protection: None    Comment: patient's husband has prostate issues so she cannot with him. has not in 31yrs.  Other Topics Concern   Not on file  Social History Narrative   Mother is 94yrs old that has dementia, she has a Actuary but still checks on her       Husband has prostate issues and early dementia, he has been losing his patience and it has been stressful       Patient has 7 deteriorated disc   Social Determinants of Health   Financial Resource  Strain: Low Risk    Difficulty of Paying Living Expenses: Not hard at all  Food Insecurity: No Food Insecurity   Worried About Charity fundraiser in the Last Year: Never true   [place] in the Last Year: Never true  Transportation Needs: No Transportation Needs   Lack of Transportation (Medical): No   Lack of Transportation (Non-Medical): No  Physical Activity: Inactive   Days of Exercise per Week: 0 days   Minutes of Exercise per Session: 0 min  Stress: Stress Concern Present   Feeling of Stress : Rather much  Social Connections: Moderately Integrated   Frequency of Communication with Friends and Family: More than three times a week   Frequency of Social Gatherings with Friends and Family: Once a week   Attends Religious Services: More than 4 times per year   Active Member of [employer] or Organizations: No   Attends Music therapist: Never   Marital Status: Married  Human resources officer Violence: Not At Risk   Fear of Current or Ex-Partner: No   Emotionally Abused: No   Physically Abused: No   Sexually Abused: No     Current Outpatient Medications:    Cholecalciferol (VITAMIN D3) 5000 UNITS CAPS, Take 5,000 Units by mouth every other day. , Disp: , Rfl:    conjugated estrogens (PREMARIN) vaginal cream, Place 1 Applicatorful vaginally daily. One pea size on introitus, Disp: 42.5 g, Rfl: 12   gabapentin (NEURONTIN) 100 MG capsule, TAKE 1 CAPSULE AT BEDTIME, Disp: 90 capsule, Rfl: 1   IBU 600 MG tablet, Take 600 mg by mouth every 6 (six) hours as needed., Disp: , Rfl:    OVER THE COUNTER MEDICATION, 1 capsule 2 (two) times daily. , Disp: , Rfl:    pyridoxine (B-6) 100 MG tablet, Take 100 mg by mouth daily., Disp: , Rfl:    sertraline (ZOLOFT) 50 MG tablet, Take 1 tablet (50 mg total) by mouth daily., Disp: 30 tablet, Rfl: 2   simvastatin (ZOCOR) 20 MG tablet, TAKE ONE TABLET BY MOUTH AT BEDTIME, Disp: 90 tablet, Rfl: 0   vitamin B-12 (CYANOCOBALAMIN) 1000 MCG tablet,  Take 1,000 mcg by mouth daily., Disp: , Rfl:    azithromycin (ZITHROMAX) 500 MG tablet, Take 500 mg by mouth daily. (Patient not taking: Reported on 09/26/2020), Disp: , Rfl:   Allergies  Allergen Reactions   Moxifloxacin Hcl In Nacl Swelling   Prednisone Other (See Comments)    Paranoid   Penicillins Rash and Other (See Comments)    Has patient had a PCN reaction causing immediate rash, facial/tongue/throat swelling, SOB or lightheadedness with hypotension: Unknown  Has patient had a PCN reaction causing severe rash involving mucus membranes or skin necrosis: No Has patient had a PCN reaction that required hospitalization: Yes Has patient had a PCN reaction occurring within the last 10 years: No If all of the above answers are "NO", then may proceed with Cephalosporin use.     I personally reviewed active problem list, medication list, allergies, family history with the patient/caregiver today.   ROS  Ten systems reviewed and is negative except as mentioned in HPI   Objective  Virtual encounter, vitals not obtained.  Body mass index is 31.83 kg/m.  Physical Exam  Awake, alert and oriented   PHQ2/9: Depression screen [facility] 2/9 09/26/2020 09/05/2020 02/09/2020 01/20/2020 12/16/2019  Decreased Interest 0 0 0 0 0  Down, Depressed, Hopeless 0 0 0 0 0  PHQ - 2 Score 0 0 0 0 0  Altered sleeping 0 0 - - -  Tired, decreased energy 0 0 - - -  Change in appetite 0 0 - - -  Feeling bad or failure about yourself  0 0 - - -  Trouble concentrating 0 0 - - -  Moving slowly or fidgety/restless 0 0 - - -  Suicidal thoughts 0 0 - - -  PHQ-9 Score 0 0 - - -  Difficult doing work/chores - - - - -  Some recent data might be hidden   PHQ-2/9 Result is negative.    Fall Risk: Fall Risk  09/26/2020 09/05/2020 02/09/2020 01/20/2020 12/16/2019  Falls in the past year? 0 0 0 0 0  Number falls in past yr: 0 0 0 0 0  Injury with Fall? 0 0 0 0 0  Risk for fall due to : - - No Fall Risks - -  Risk for  fall due to: Comment - - - - -  Follow up Falls prevention discussed Falls prevention discussed Falls prevention discussed - -     Assessment & Plan  1. Dysthymia  - sertraline (ZOLOFT) 25 MG tablet; Take 1 tablet (25 mg total) by mouth daily.  Dispense: 90 tablet; Refill: 1   I discussed the assessment and treatment plan with the patient. The patient was provided an opportunity to ask questions and all were answered. The patient agreed with the plan and demonstrated an understanding of the instructions.   The patient was advised to call back or seek an in-person evaluation if the symptoms worsen or if the condition fails to improve as anticipated.  I provided 15  minutes of non-face-to-face time during this encounter.  Loistine Chance, MD

## 2020-09-26 ENCOUNTER — Telehealth (INDEPENDENT_AMBULATORY_CARE_PROVIDER_SITE_OTHER): Payer: PPO | Admitting: Family Medicine

## 2020-09-26 ENCOUNTER — Encounter: Payer: Self-pay | Admitting: Family Medicine

## 2020-09-26 ENCOUNTER — Telehealth: Payer: Self-pay

## 2020-09-26 DIAGNOSIS — M9903 Segmental and somatic dysfunction of lumbar region: Secondary | ICD-10-CM | POA: Diagnosis not present

## 2020-09-26 DIAGNOSIS — M9901 Segmental and somatic dysfunction of cervical region: Secondary | ICD-10-CM | POA: Diagnosis not present

## 2020-09-26 DIAGNOSIS — F341 Dysthymic disorder: Secondary | ICD-10-CM | POA: Diagnosis not present

## 2020-09-26 DIAGNOSIS — M62830 Muscle spasm of back: Secondary | ICD-10-CM | POA: Diagnosis not present

## 2020-09-26 DIAGNOSIS — M5033 Other cervical disc degeneration, cervicothoracic region: Secondary | ICD-10-CM | POA: Diagnosis not present

## 2020-09-26 MED ORDER — SERTRALINE HCL 25 MG PO TABS: 25.0000 mg | ORAL_TABLET | Freq: Every day | ORAL | 1 refills | Status: DC

## 2020-09-26 NOTE — Telephone Encounter (Signed)
Left message on both numbers/attempted triage.
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
## 2020-10-10 ENCOUNTER — Ambulatory Visit (INDEPENDENT_AMBULATORY_CARE_PROVIDER_SITE_OTHER): Payer: PPO | Admitting: Family Medicine

## 2020-10-10 ENCOUNTER — Other Ambulatory Visit: Payer: Self-pay

## 2020-10-10 ENCOUNTER — Encounter: Payer: Self-pay | Admitting: Family Medicine

## 2020-10-10 VITALS — BP 130/76 | HR 72 | Temp 98.2°F | Resp 16 | Ht 60.0 in | Wt 164.0 lb

## 2020-10-10 DIAGNOSIS — F341 Dysthymic disorder: Secondary | ICD-10-CM | POA: Diagnosis not present

## 2020-10-10 DIAGNOSIS — E538 Deficiency of other specified B group vitamins: Secondary | ICD-10-CM | POA: Diagnosis not present

## 2020-10-10 DIAGNOSIS — R4189 Other symptoms and signs involving cognitive functions and awareness: Secondary | ICD-10-CM

## 2020-10-10 MED ORDER — ESCITALOPRAM OXALATE 5 MG PO TABS: 5.0000 mg | ORAL_TABLET | Freq: Every day | ORAL | 0 refills | Status: DC

## 2020-10-11 ENCOUNTER — Telehealth: Payer: Self-pay | Admitting: *Deleted

## 2020-10-11 LAB — COMPLETE METABOLIC PANEL WITH GFR
AG Ratio: 1.6 (calc) (ref 1.0–2.5)
ALT: 16 U/L (ref 6–29)
AST: 23 U/L (ref 10–35)
Albumin: 4.4 g/dL (ref 3.6–5.1)
Alkaline phosphatase (APISO): 48 U/L (ref 37–153)
BUN: 18 mg/dL (ref 7–25)
CO2: 30 mmol/L (ref 20–32)
Calcium: 9.5 mg/dL (ref 8.6–10.4)
Chloride: 105 mmol/L (ref 98–110)
Creat: 0.82 mg/dL (ref 0.60–0.93)
GFR, Est African American: 81 mL/min/{1.73_m2} (ref >=60)
GFR, Est Non African American: 70 mL/min/{1.73_m2} (ref >=60)
Globulin: 2.7 g/dL (calc) (ref 1.9–3.7)
Glucose, Bld: 79 mg/dL (ref 65–139)
Potassium: 4.6 mmol/L (ref 3.5–5.3)
Sodium: 142 mmol/L (ref 135–146)
Total Bilirubin: 0.3 mg/dL (ref 0.2–1.2)
Total Protein: 7.1 g/dL (ref 6.1–8.1)

## 2020-10-11 LAB — LIPID PANEL
Cholesterol: 183 mg/dL (ref <200)
HDL: 73 mg/dL (ref >=50)
LDL Cholesterol (Calc): 90 mg/dL (calc)
Non-HDL Cholesterol (Calc): 110 mg/dL (calc) (ref <130)
Total CHOL/HDL Ratio: 2.5 (calc) (ref <5.0)
Triglycerides: 100 mg/dL (ref <150)

## 2020-10-11 LAB — CBC WITH DIFFERENTIAL/PLATELET
Absolute Monocytes: 333 cells/uL (ref 200–950)
Basophils Absolute: 88 cells/uL (ref 0–200)
Basophils Relative: 1.7 %
Eosinophils Absolute: 151 cells/uL (ref 15–500)
Eosinophils Relative: 2.9 %
HCT: 39 % (ref 35.0–45.0)
Hemoglobin: 12.8 g/dL (ref 11.7–15.5)
Lymphs Abs: 1711 cells/uL (ref 850–3900)
MCH: 31.2 pg (ref 27.0–33.0)
MCHC: 32.8 g/dL (ref 32.0–36.0)
MCV: 95.1 fL (ref 80.0–100.0)
MPV: 10.5 fL (ref 7.5–12.5)
Monocytes Relative: 6.4 %
Neutro Abs: 2917 cells/uL (ref 1500–7800)
Neutrophils Relative %: 56.1 %
Platelets: 307 10*3/uL (ref 140–400)
RBC: 4.1 10*6/uL (ref 3.80–5.10)
RDW: 12.7 % (ref 11.0–15.0)
Total Lymphocyte: 32.9 %
WBC: 5.2 10*3/uL (ref 3.8–10.8)

## 2020-10-11 LAB — B12 AND FOLATE PANEL
Folate: >24 ng/mL
Vitamin B-12: 1291 pg/mL — ABNORMAL HIGH (ref 200–1100)

## 2020-10-11 LAB — TSH: TSH: 2.12 mIU/L (ref 0.40–4.50)

## 2020-10-11 NOTE — Chronic Care Management (AMB) (Signed)
  Chronic Care Management   Note  10/11/2020 Name: [name] MRN: [id number] DOB: 13-Feb-1946  Abigail Hatfield is a 75 y.o. year old female who is a primary care patient of Steele Sizer, MD. I reached out to Abigail Hatfield by phone today in response to a referral sent by Ms. Octavio Graves PCP, Dr. Ancil Boozer      [name] was given information about Chronic Care Management services today including:  CCM service includes personalized support from designated clinical staff supervised by her physician, including individualized plan of care and coordination with other care providers 24/7 contact phone numbers for assistance for urgent and routine care needs. Service will only be billed when office clinical staff spend 20 minutes or more in a month to coordinate care. Only one practitioner may furnish and bill the service in a calendar month. The patient may stop CCM services at any time (effective at the end of the month) by phone call to the office staff. The patient will be responsible for cost sharing (co-pay) of up to 20% of the service fee (after annual deductible is met).  Patient agreed to services and verbal consent obtained.   Follow up plan: Telephone appointment with care management team member scheduled for:10/19/2020  Laurabeth Yip  Care Guide, Embedded Care Coordination [place]  Care Management

## 2020-10-12 NOTE — Progress Notes (Signed)
Called pt and notified of results.

## 2020-10-19 ENCOUNTER — Ambulatory Visit (INDEPENDENT_AMBULATORY_CARE_PROVIDER_SITE_OTHER): Payer: PPO | Admitting: *Deleted

## 2020-10-19 ENCOUNTER — Telehealth: Payer: Self-pay | Admitting: *Deleted

## 2020-10-19 DIAGNOSIS — F341 Dysthymic disorder: Secondary | ICD-10-CM

## 2020-10-19 DIAGNOSIS — R4189 Other symptoms and signs involving cognitive functions and awareness: Secondary | ICD-10-CM

## 2020-10-19 NOTE — Telephone Encounter (Signed)
  Care Management   Follow Up Note   10/19/2020 Name: Abigail Hatfield MRN: [id number] DOB: Mar 01, 1946   Referred by: Steele Sizer, MD Reason for referral : Care Coordination   An unsuccessful telephone outreach was attempted today. The patient was referred to the case management team for assistance with care management and care coordination.   Follow Up Plan: The care management team will reach out to the patient again over the next 7-14 business days.    Elliot Gurney, [place] Administrator[facility] Management [phone] \

## 2020-10-19 NOTE — Chronic Care Management (AMB) (Addendum)
Chronic Care Management    Clinical Social Work Note  10/19/2020 Name: Abigail Hatfield MRN: [id number] DOB: 06/26/1945  Abigail Hatfield is a 75 y.o. year old female who is a primary care patient of Steele Sizer, MD. The CCM team was consulted to assist the patient with chronic disease management and/or care coordination needs related to: Mental Health Counseling and Resources.   Engaged with patient by telephone for initial visit in response to provider referral for social work chronic care management and care coordination services.   Consent to Services:  The patient was given the following information about Chronic Care Management services today, agreed to services, and gave verbal consent: 1. CCM service includes personalized support from designated clinical staff supervised by the primary care provider, including individualized plan of care and coordination with other care providers 2. 24/7 contact phone numbers for assistance for urgent and routine care needs. 3. Service will only be billed when office clinical staff spend 20 minutes or more in a month to coordinate care. 4. Only one practitioner may furnish and bill the service in a calendar month. 5.The patient may stop CCM services at any time (effective at the end of the month) by phone call to the office staff. 6. The patient will be responsible for cost sharing (co-pay) of up to 20% of the service fee (after annual deductible is met). Patient agreed to services and consent obtained.  Patient agreed to services and consent obtained.   Assessment: Review of patient past medical history, allergies, medications, and health status, including review of relevant consultants reports was performed today as part of a comprehensive evaluation and provision of chronic care management and care coordination services.     SDOH (Social Determinants of Health) assessments and interventions performed:  SDOH Interventions    Flowsheet Row Most Recent  Value  SDOH Interventions   Depression Interventions/Treatment  Medication, Counseling        Advanced Directives Status: Not addressed in this encounter.  CCM Care Plan  Allergies  Allergen Reactions   Moxifloxacin Hcl In Nacl Swelling   Prednisone Other (See Comments)    Paranoid   Penicillins Rash and Other (See Comments)    Has patient had a PCN reaction causing immediate rash, facial/tongue/throat swelling, SOB or lightheadedness with hypotension: Unknown Has patient had a PCN reaction causing severe rash involving mucus membranes or skin necrosis: No Has patient had a PCN reaction that required hospitalization: Yes Has patient had a PCN reaction occurring within the last 10 years: No If all of the above answers are "NO", then may proceed with Cephalosporin use.     Outpatient Encounter Medications as of 10/19/2020  Medication Sig   Cholecalciferol (VITAMIN D3) 5000 UNITS CAPS Take 5,000 Units by mouth every other day.    conjugated estrogens (PREMARIN) vaginal cream Place 1 Applicatorful vaginally daily. One pea size on introitus   escitalopram (LEXAPRO) 5 MG tablet Take 1 tablet (5 mg total) by mouth daily.   gabapentin (NEURONTIN) 100 MG capsule TAKE 1 CAPSULE AT BEDTIME   IBU 600 MG tablet Take 600 mg by mouth every 6 (six) hours as needed.   OVER THE COUNTER MEDICATION 1 capsule 2 (two) times daily.    pyridoxine (B-6) 100 MG tablet Take 100 mg by mouth daily.   simvastatin (ZOCOR) 20 MG tablet TAKE ONE TABLET BY MOUTH AT BEDTIME   vitamin B-12 (CYANOCOBALAMIN) 1000 MCG tablet Take 1,000 mcg by mouth daily.   No facility-administered encounter medications  on file as of 10/19/2020.    Patient Active Problem List   Diagnosis Date Noted   Dysthymia 09/05/2020   Hyperglycemia 09/05/2020   Low serum vitamin B12 09/05/2020   Chronic bilateral low back pain without sciatica 09/05/2020   Obesity (BMI 30.0-34.9) 12/07/2017   Degenerative joint disease of low back  12/07/2017   Senile purpura ([place]) 12/07/2017   Hyperlipidemia 12/21/2014    Conditions to be addressed/monitored: Depression; Mental Health Concerns   Care Plan : Depression (Adult)  Updates made by Vern Claude, LCSW since 10/19/2020 12:00 AM     Problem: Symptoms (Depression)      Goal: Symptoms Monitored and Managed   Start Date: 10/19/2020  Expected End Date: 05/22/2021  This Visit's Progress: On track  Priority: Medium  Note:   Current Barriers:  Chronic Mental Health needs related to caregiver stress, family/relationship dysfunction Suicidal Ideation/Homicidal Ideation: No  Clinical Social Work Goal(s):  Over the next 90 days, patient will work with SW bi-weekly by telephone or in person to reduce or manage symptoms related to depression  Interventions: Patient interviewed and appropriate assessments performed: PHQ 2 PHQ 9 SDOH Interventions    Flowsheet Row Most Recent Value  SDOH Interventions   Depression Interventions/Treatment  Medication, Counseling     Patient discussed caregiver stress, related to care for her mother and spouse who has Dementia. Patient's mother has a caregiver at night, however patient and daughter assist with patient's care during the day Patient confirmed that her care giving responsibilities are very stressful, however able to verbalize positive self management strategies as well as a positive network of social support. Patient provided with reassurance and emotional support Positive self management options explored, self care emphasized  Active listening / Reflection utilized  Reviewed mental health medications with patient and discussed compliance: patient states that the Lexapro prescribed has been beneficial lf Care Activities:  Performs ADL's independently Performs IADL's independently Ability for insight  Patient Coping Strengths:  Supportive Relationships Spirituality Able to Communicate Effectively  Patient Self Care  Deficits:  Self care limited due to caregiver responsibilities  Initial goal documentation     Evidence-based guidance:  Promote use of complementary and alternative medicine therapy including exercise, relaxation, music, bright light or dance therapy, acupuncture, aromatherapy.  Facilitate and advocate for mental health treatment that may include depression-focused psychotherapy, cognitive-behavioral therapy, social-skills training, relaxation training, problem-solving therapy.  .    Consider referral to community-based health program for support and group activities, such as exercise classes.   N    Task: Alleviate Barriers to Depression Treatment   Due Date: 05/22/2021  Priority: Routine  Note:   Care Management Activities:    - depression screen reviewed - emotional support provided - participation in mental health treatment encouraged - social activities and relationships encouraged    Notes:       Follow Up Plan: SW will follow up with patient by phone over the next 10/29/20      Elliot Gurney, [place] Worker  [facility] Care Management [phone]

## 2020-10-19 NOTE — Patient Instructions (Signed)
Visit Information   Goals Addressed             This Visit's Progress    Manage My Emotions       Timeframe:  Long-Range Goal Priority:  Medium Start Date:     10/19/20                        Expected End Date:    05/05/20                   Follow Up Date 11/02/20    - begin personal counseling - call and visit an old friend - laugh; watch a funny movie or comedian - practice relaxation or meditation daily - talk about feelings with a friend, family or spiritual advisor - practice positive thinking and self-talk    Why is this important?   When you are stressed, down or upset, your body reacts too.  For example, your blood pressure may get higher; you may have a headache or stomachache.  When your emotions get the best of you, your body's ability to fight off cold and flu gets weak.  These steps will help you manage your emotions.     Notes:          The patient verbalized understanding of instructions, educational materials, and care plan provided today and declined offer to receive copy of patient instructions, educational materials, and care plan.   Telephone follow up appointment with care management team member scheduled for:10/29/20  Dickie Labarre, [place] Worker  [facility] Care Management [phone]

## 2020-10-29 ENCOUNTER — Other Ambulatory Visit: Payer: Self-pay | Admitting: Family Medicine

## 2020-10-29 ENCOUNTER — Ambulatory Visit: Payer: Self-pay | Admitting: *Deleted

## 2020-10-29 DIAGNOSIS — F341 Dysthymic disorder: Secondary | ICD-10-CM

## 2020-10-29 DIAGNOSIS — Z638 Other specified problems related to primary support group: Secondary | ICD-10-CM

## 2020-10-29 NOTE — Patient Instructions (Signed)
Visit Information   Goals Addressed             This Visit's Progress    Manage My Emotions       Timeframe:  Long-Range Goal Priority:  Medium Start Date:     10/19/20                        Expected End Date:    05/05/20                   Follow Up Date 11/13/20    - begin personal counseling - call and visit an old friend - laugh; watch a funny movie or comedian - practice relaxation or meditation daily - talk about feelings with a friend, family or spiritual advisor - practice positive thinking and self-talk    Why is this important?   When you are stressed, down or upset, your body reacts too.  For example, your blood pressure may get higher; you may have a headache or stomachache.  When your emotions get the best of you, your body's ability to fight off cold and flu gets weak.  These steps will help you manage your emotions.     Notes:         The patient verbalized understanding of instructions, educational materials, and care plan provided today and declined offer to receive copy of patient instructions, educational materials, and care plan.   Telephone follow up appointment with care management team member scheduled for:11/13/20  Elliot Gurney, [place] Worker  [facility] Care Management [phone]

## 2020-10-29 NOTE — Chronic Care Management (AMB) (Addendum)
Chronic Care Management    Clinical Social Work Note  10/29/2020 Name: Abigail Hatfield MRN: [id number] DOB: [birth year]  Abigail Hatfield is a 75 y.o. year old female who is a primary care patient of Abigail Sizer, MD. The CCM team was consulted to assist the patient with chronic disease management and/or care coordination needs related to: Caregiver Stress.   Engaged with patient by telephone for follow up visit in response to provider referral for social work chronic care management and care coordination services.   Consent to Services:  The patient was given information about Chronic Care Management services, agreed to services, and gave verbal consent prior to initiation of services.  Please see initial visit note for detailed documentation.   Patient agreed to services and consent obtained.   Assessment: Review of patient past medical history, allergies, medications, and health status, including review of relevant consultants reports was performed today as part of a comprehensive evaluation and provision of chronic care management and care coordination services.     SDOH (Social Determinants of Health) assessments and interventions performed:    Advanced Directives Status: Not addressed in this encounter.  CCM Care Plan  Allergies  Allergen Reactions   Moxifloxacin Hcl In Nacl Swelling   Prednisone Other (See Comments)    Paranoid   Penicillins Rash and Other (See Comments)    Has patient had a PCN reaction causing immediate rash, facial/tongue/throat swelling, SOB or lightheadedness with hypotension: Unknown Has patient had a PCN reaction causing severe rash involving mucus membranes or skin necrosis: No Has patient had a PCN reaction that required hospitalization: Yes Has patient had a PCN reaction occurring within the last 10 years: No If all of the above answers are "NO", then may proceed with Cephalosporin use.     Outpatient Encounter Medications as of 10/29/2020   Medication Sig   Cholecalciferol (VITAMIN D3) 5000 UNITS CAPS Take 5,000 Units by mouth every other day.    conjugated estrogens (PREMARIN) vaginal cream Place 1 Applicatorful vaginally daily. One pea size on introitus   escitalopram (LEXAPRO) 5 MG tablet Take 1 tablet (5 mg total) by mouth daily.   gabapentin (NEURONTIN) 100 MG capsule TAKE 1 CAPSULE AT BEDTIME   IBU 600 MG tablet Take 600 mg by mouth every 6 (six) hours as needed.   OVER THE COUNTER MEDICATION 1 capsule 2 (two) times daily.    pyridoxine (B-6) 100 MG tablet Take 100 mg by mouth daily.   simvastatin (ZOCOR) 20 MG tablet TAKE ONE TABLET BY MOUTH AT BEDTIME   vitamin B-12 (CYANOCOBALAMIN) 1000 MCG tablet Take 1,000 mcg by mouth daily.   No facility-administered encounter medications on file as of 10/29/2020.    Patient Active Problem List   Diagnosis Date Noted   Dysthymia 09/05/2020   Hyperglycemia 09/05/2020   Low serum vitamin B12 09/05/2020   Chronic bilateral low back pain without sciatica 09/05/2020   Obesity (BMI 30.0-34.9) 12/07/2017   Degenerative joint disease of low back 12/07/2017   Senile purpura ([place]) 12/07/2017   Hyperlipidemia 12/21/2014    Conditions to be addressed/monitored: Depression; Mental Health Concerns   Care Plan : Depression (Adult)  Updates made by Vern Claude, LCSW since 10/29/2020 12:00 AM     Problem: Symptoms (Depression)      Goal: Symptoms Monitored and Managed   Start Date: 10/19/2020  Expected End Date: 05/22/2021  This Visit's Progress: On track  Recent Progress: On track  Priority: Medium  Note:   Current  Barriers:  Chronic Mental Health needs related to caregiver stress, family/relationship dysfunction Suicidal Ideation/Homicidal Ideation: No  Clinical Social Work Goal(s):  Over the next 90 days, patient will work with SW bi-weekly by telephone or in person to reduce or manage symptoms related to depression  Interventions: Patient interviewed and appropriate  assessments performed: PHQ 2 PHQ 9 SDOH Interventions    Flowsheet Row Most Recent Value  SDOH Interventions   Depression Interventions/Treatment  Medication, Counseling     Follow up with patient related to caregiver stress.  Patient's mother continues to have a caregiver at night, however patient and daughter assist with patient's care during the day Patient's family trip to the beach discussed with patient confirming having a few struggles but overall had a good time Patient continues to confirm that her care giving responsibilities are very stressful, however continue to be able to verbalize positive self management strategies as well as a positive network of social support. Patient provided with reassurance and emotional support Positive self management options explored, self care emphasized  Active listening / Reflection utilized  Reviewed mental health medications with patient and discussed compliance: patient states that the Lexapro prescribed has been beneficial however refill needed-pharmacy will contact patient's provider, patient has follow up with provider at the ned of the month. lf Care Activities:  Performs ADL's independently Performs IADL's independently Ability for insight  Patient Coping Strengths:  Supportive Relationships Spirituality Able to Communicate Effectively  Patient Self Care Deficits:  Self care limited due to caregiver responsibilities  Please see past updates related to this goal by clicking on the "Past Updates" button in the selected goal      Evidence-based guidance:  Promote use of complementary and alternative medicine therapy including exercise, relaxation, music, bright light or dance therapy, acupuncture, aromatherapy.  Facilitate and advocate for mental health treatment that may include depression-focused psychotherapy, cognitive-behavioral therapy, social-skills training, relaxation training, problem-solving therapy.  .    Consider  referral to community-based health program for support and group activities, such as exercise classes.   N      Follow Up Plan: SW will follow up with patient by phone over the next 7-14 business days      [place], [place] Worker  [facility] [phone]

## 2020-10-30 ENCOUNTER — Other Ambulatory Visit: Payer: Self-pay | Admitting: Family Medicine

## 2020-10-30 ENCOUNTER — Telehealth: Payer: Self-pay

## 2020-10-30 DIAGNOSIS — F341 Dysthymic disorder: Secondary | ICD-10-CM

## 2020-10-30 MED ORDER — ESCITALOPRAM OXALATE 5 MG PO TABS: 5.0000 mg | ORAL_TABLET | Freq: Every day | ORAL | 0 refills | Status: DC

## 2020-10-30 NOTE — Telephone Encounter (Signed)
Copied from [place] [id number]. Topic: Quick Communication - Rx Refill/Question >> Oct 30, 2020 11:32 AM [place], Brayton Layman A wrote: Pt stated she ran out early of escitalopram (LEXAPRO) 5 MG tablet, pt wanted a refill but a refill is not due yet, pt wanted to know if she is supposed to continue on this medication. Pt requested a call back.

## 2020-11-02 ENCOUNTER — Other Ambulatory Visit: Payer: Self-pay | Admitting: Family Medicine

## 2020-11-02 DIAGNOSIS — F341 Dysthymic disorder: Secondary | ICD-10-CM

## 2020-11-07 NOTE — Progress Notes (Signed)
Name: Abigail Hatfield   MRN: [id number]    DOB: 02/17/1946   Date:11/08/2020       Progress Note  Subjective  Chief Complaint  Follow Up  HPI  Dysthymia:she came in one month ago with her daughter. She was given zoloft and symptoms got worse, crying, unable to make decisions. Daughter was really worried. We changed to lexapro on her last visit, she is still under a lot of stress at home, however coping much better. No recent episodes of crying spells. More assertive and feels like her normal self. No side effects of medication and would like to continue taking medication  Senile purpura: worse on left arm, also excoriation on left upper arm, refused Tdap  Reassurance given about senile purpura    Patient Active Problem List   Diagnosis Date Noted   Dysthymia 09/05/2020   Hyperglycemia 09/05/2020   Low serum vitamin B12 09/05/2020   Chronic bilateral low back pain without sciatica 09/05/2020   Obesity (BMI 30.0-34.9) 12/07/2017   Degenerative joint disease of low back 12/07/2017   Senile purpura ([place]) 12/07/2017   Hyperlipidemia 12/21/2014    Past Surgical History:  Procedure Laterality Date   ABDOMINAL HYSTERECTOMY  1980   CATARACT EXTRACTION W/PHACO Right 06/24/2017   Procedure: CATARACT EXTRACTION PHACO AND INTRAOCULAR LENS PLACEMENT ([place]);  Surgeon: Birder Robson, MD;  Location: [facility] ORS;  Service: Ophthalmology;  Laterality: Right;  [place] 00:48.0 AP% 16.5 CDE 7.92 Fluid Pack Lot # [id number]   CATARACT EXTRACTION W/PHACO Left 07/14/2017   Procedure: CATARACT EXTRACTION PHACO AND INTRAOCULAR LENS PLACEMENT ([facility]);  Surgeon: Birder Robson, MD;  Location: [facility] ORS;  Service: Ophthalmology;  Laterality: Left;  [place] 00:36.0 AP% 16.9 CDE 6.08 Fluid Pack Lot # [id number]   CESAREAN SECTION  1967   CESAREAN SECTION  1971   CHOLECYSTECTOMY  1972   COLONOSCOPY WITH PROPOFOL N/A 12/24/2017   Procedure: COLONOSCOPY WITH PROPOFOL;  Surgeon: Jonathon Bellows, MD;  Location: [facility] END[facility]COPY;   Service: Gastroenterology;  Laterality: N/A;   EYE SURGERY     retal fistula repair     Dr. Jamal Collin x 2    Family History  Problem Relation Age of Onset   Diabetes Mother    Hypertension Mother    Heart attack Mother    Heart failure Father    Heart disease Father    Diabetes Brother    Hypertension Brother    COPD Brother    Diabetes Sister    Breast cancer Neg Hx     Social History   Tobacco Use   Smoking status: Former    Packs/day: 0.50    Years: 5.00    Pack years: 2.50    Types: Cigarettes    Quit date: 12/07/1988    Years since quitting: 31.9   Smokeless tobacco: Never   Tobacco comments:    smoking cessation materials not required  Substance Use Topics   Alcohol use: No    Alcohol/week: 0.0 standard drinks     Current Outpatient Medications:    Cholecalciferol (VITAMIN D3) 5000 UNITS CAPS, Take 5,000 Units by mouth every other day. , Disp: , Rfl:    conjugated estrogens (PREMARIN) vaginal cream, Place 1 Applicatorful vaginally daily. One pea size on introitus, Disp: 42.5 g, Rfl: 12   escitalopram (LEXAPRO) 5 MG tablet, Take 1 tablet (5 mg total) by mouth daily., Disp: 30 tablet, Rfl: 0   gabapentin (NEURONTIN) 100 MG capsule, TAKE 1 CAPSULE AT BEDTIME, Disp: 90 capsule, Rfl: 1  IBU 600 MG tablet, Take 600 mg by mouth every 6 (six) hours as needed., Disp: , Rfl:    OVER THE COUNTER MEDICATION, 1 capsule 2 (two) times daily. , Disp: , Rfl:    pyridoxine (B-6) 100 MG tablet, Take 100 mg by mouth daily., Disp: , Rfl:    simvastatin (ZOCOR) 20 MG tablet, TAKE ONE TABLET BY MOUTH AT BEDTIME, Disp: 90 tablet, Rfl: 0   vitamin B-12 (CYANOCOBALAMIN) 1000 MCG tablet, Take 1,000 mcg by mouth daily., Disp: , Rfl:   Allergies  Allergen Reactions   Moxifloxacin Hcl In Nacl Swelling   Prednisone Other (See Comments)    Paranoid   Penicillins Rash and Other (See Comments)    Has patient had a PCN reaction causing immediate rash, facial/tongue/throat swelling, SOB or  lightheadedness with hypotension: Unknown Has patient had a PCN reaction causing severe rash involving mucus membranes or skin necrosis: No Has patient had a PCN reaction that required hospitalization: Yes Has patient had a PCN reaction occurring within the last 10 years: No If all of the above answers are "NO", then may proceed with Cephalosporin use.     I personally reviewed active problem list, medication list, allergies, family history, social history with the patient/caregiver today.   ROS  Ten systems reviewed and is negative except as mentioned in HPI   Objective  Vitals:   11/08/20 1055  BP: 116/78  Pulse: 75  Resp: 16  Temp: 98.1 F (36.7 C)  SpO2: 97%  Weight: 162 lb 9.6 oz (73.8 kg)  Height: 5' (1.524 m)    Body mass index is 31.76 kg/m.  Physical Exam  Constitutional: Patient appears well-developed and well-nourished. Obese  No distress.  HEENT: head atraumatic, normocephalic, pupils equal and reactive to light, neck supple Cardiovascular: Normal rate, regular rhythm and normal heart sounds.  No murmur heard. No BLE edema. Pulmonary/Chest: Effort normal and breath sounds normal. No respiratory distress. Abdominal: Soft.  There is no tenderness. Skin: senile purpura  Psychiatric: Patient has a normal mood and affect. behavior is normal. Judgment and thought content normal.   Recent Results (from the past 2160 hour(s))  B12 and Folate Panel     Status: Abnormal   Collection Time: 10/10/20  8:51 AM  Result Value Ref Range   Vitamin B-12 1,291 (H) 200 - 1,100 pg/mL   Folate >24.0 ng/mL    Comment:                            Reference Range                            Low:           <3.4                            Borderline:    3.4-5.4                            Normal:        >5.4 .   Lipid panel     Status: None   Collection Time: 10/10/20  8:51 AM  Result Value Ref Range   Cholesterol 183 <200 mg/dL   HDL 73 > OR = 50 mg/dL   Triglycerides 100  <150 mg/dL   LDL Cholesterol (Calc) 90 mg/dL (  calc)    Comment: Reference range: <100 . Desirable range <100 mg/dL for primary prevention;   <70 mg/dL for patients with CHD or diabetic patients  with > or = 2 CHD risk factors. Marland Kitchen LDL-C is now calculated using the Martin-Hopkins  calculation, which is a validated novel method providing  better accuracy than the Friedewald equation in the  estimation of LDL-C.  Cresenciano Genre et al. Annamaria Helling. [id number]): 2061-2068  (http://education.[employer].com/faq/FAQ164)    Total CHOL/HDL Ratio 2.5 <5.0 (calc)   Non-HDL Cholesterol (Calc) 110 <130 mg/dL (calc)    Comment: For patients with diabetes plus 1 major ASCVD risk  factor, treating to a non-HDL-C goal of <100 mg/dL  (LDL-C of <70 mg/dL) is considered a therapeutic  option.   COMPLETE METABOLIC PANEL WITH GFR     Status: None   Collection Time: 10/10/20  8:51 AM  Result Value Ref Range   Glucose, Bld 79 65 - 139 mg/dL    Comment: .        Non-fasting reference interval .    BUN 18 7 - 25 mg/dL   Creat 0.82 0.60 - 0.93 mg/dL    Comment: For patients >76 years of age, the reference limit for Creatinine is approximately 13% higher for people identified as African-American. .    GFR, Est Non African American 70 > OR = 60 mL/min/1.53m   GFR, Est African American 81 > OR = 60 mL/min/1.736m  BUN/Creatinine Ratio NOT APPLICABLE 6 - 22 (calc)   Sodium 142 135 - 146 mmol/L   Potassium 4.6 3.5 - 5.3 mmol/L   Chloride 105 98 - 110 mmol/L   CO2 30 20 - 32 mmol/L   Calcium 9.5 8.6 - 10.4 mg/dL   Total Protein 7.1 6.1 - 8.1 g/dL   Albumin 4.4 3.6 - 5.1 g/dL   Globulin 2.7 1.9 - 3.7 g/dL (calc)   AG Ratio 1.6 1.0 - 2.5 (calc)   Total Bilirubin 0.3 0.2 - 1.2 mg/dL   Alkaline phosphatase (APISO) 48 37 - 153 U/L   AST 23 10 - 35 U/L   ALT 16 6 - 29 U/L  CBC with Differential/Platelet     Status: None   Collection Time: 10/10/20  8:51 AM  Result Value Ref Range   WBC 5.2 3.8 - 10.8  Thousand/uL   RBC 4.10 3.80 - 5.10 Million/uL   Hemoglobin 12.8 11.7 - 15.5 g/dL   HCT 39.0 35.0 - 45.0 %   MCV 95.1 80.0 - 100.0 fL   MCH 31.2 27.0 - 33.0 pg   MCHC 32.8 32.0 - 36.0 g/dL   RDW 12.7 11.0 - 15.0 %   Platelets 307 140 - 400 Thousand/uL   MPV 10.5 7.5 - 12.5 fL   Neutro Abs 2,917 1,500 - 7,800 cells/uL   Lymphs Abs 1,711 850 - 3,900 cells/uL   Absolute Monocytes 333 200 - 950 cells/uL   Eosinophils Absolute 151 15 - 500 cells/uL   Basophils Absolute 88 0 - 200 cells/uL   Neutrophils Relative % 56.1 %   Total Lymphocyte 32.9 %   Monocytes Relative 6.4 %   Eosinophils Relative 2.9 %   Basophils Relative 1.7 %  TSH     Status: None   Collection Time: 10/10/20  8:51 AM  Result Value Ref Range   TSH 2.12 0.40 - 4.50 mIU/L     PHQ2/9: Depression screen [facility]/9 10/19/2020 10/10/2020 09/26/2020 09/05/2020 02/09/2020  Decreased Interest 0 3 0 0 0  Down,  Depressed, Hopeless 0 3 0 0 0  PHQ - 2 Score 0 6 0 0 0  Altered sleeping 0 0 0 0 -  Tired, decreased energy 0 0 0 0 -  Change in appetite 0 0 0 0 -  Feeling bad or failure about yourself  0 0 0 0 -  Trouble concentrating 0 0 0 0 -  Moving slowly or fidgety/restless 0 0 0 0 -  Suicidal thoughts 0 0 0 0 -  PHQ-9 Score 0 6 0 0 -  Difficult doing work/chores - Very difficult - - -  Some recent data might be hidden    phq 9 is negative   Fall Risk: Fall Risk  10/10/2020 09/26/2020 09/05/2020 02/09/2020 01/20/2020  Falls in the past year? 0 0 0 0 0  Number falls in past yr: 0 0 0 0 0  Injury with Fall? 0 0 0 0 0  Risk for fall due to : - - - No Fall Risks -  Risk for fall due to: Comment - - - - -  Follow up - Falls prevention discussed Falls prevention discussed Falls prevention discussed -     Assessment & Plan  1. Dysthymia  - escitalopram (LEXAPRO) 5 MG tablet; Take 1 tablet (5 mg total) by mouth daily.  Dispense: 90 tablet; Refill: 0  2. Senile purpura ([place])  Reassurance given

## 2020-11-08 ENCOUNTER — Ambulatory Visit (INDEPENDENT_AMBULATORY_CARE_PROVIDER_SITE_OTHER): Payer: PPO | Admitting: Family Medicine

## 2020-11-08 ENCOUNTER — Encounter: Payer: Self-pay | Admitting: Family Medicine

## 2020-11-08 ENCOUNTER — Other Ambulatory Visit: Payer: Self-pay

## 2020-11-08 VITALS — BP 116/78 | HR 75 | Temp 98.1°F | Resp 16 | Ht 60.0 in | Wt 162.6 lb

## 2020-11-08 DIAGNOSIS — D692 Other nonthrombocytopenic purpura: Secondary | ICD-10-CM

## 2020-11-08 DIAGNOSIS — F341 Dysthymic disorder: Secondary | ICD-10-CM | POA: Diagnosis not present

## 2020-11-08 MED ORDER — ESCITALOPRAM OXALATE 5 MG PO TABS: 5.0000 mg | ORAL_TABLET | Freq: Every day | ORAL | 0 refills | Status: DC

## 2020-11-12 ENCOUNTER — Telehealth: Payer: Self-pay

## 2020-11-12 NOTE — Telephone Encounter (Signed)
Pt declined wanting to go to Neurology. She seemed upset that you think she needs to see one. I explained it was for memory declining and she expressed her memory is fine.

## 2020-11-12 NOTE — Telephone Encounter (Signed)
Copied from [place] [id number]. Topic: General - Other >> Nov 12, 2020 12:11 PM Valere Dross wrote: Reason for CRM: Pt called in wanting to speak with someone about er referral to the neurologist.

## 2020-11-22 DIAGNOSIS — R413 Other amnesia: Secondary | ICD-10-CM | POA: Diagnosis not present

## 2020-11-22 DIAGNOSIS — F4321 Adjustment disorder with depressed mood: Secondary | ICD-10-CM | POA: Diagnosis not present

## 2020-11-22 DIAGNOSIS — F411 Generalized anxiety disorder: Secondary | ICD-10-CM | POA: Diagnosis not present

## 2020-11-27 ENCOUNTER — Telehealth: Payer: Self-pay | Admitting: *Deleted

## 2020-11-27 ENCOUNTER — Ambulatory Visit (INDEPENDENT_AMBULATORY_CARE_PROVIDER_SITE_OTHER): Payer: PPO | Admitting: *Deleted

## 2020-11-27 DIAGNOSIS — M9903 Segmental and somatic dysfunction of lumbar region: Secondary | ICD-10-CM | POA: Diagnosis not present

## 2020-11-27 DIAGNOSIS — F341 Dysthymic disorder: Secondary | ICD-10-CM

## 2020-11-27 DIAGNOSIS — M62830 Muscle spasm of back: Secondary | ICD-10-CM | POA: Diagnosis not present

## 2020-11-27 DIAGNOSIS — M5033 Other cervical disc degeneration, cervicothoracic region: Secondary | ICD-10-CM | POA: Diagnosis not present

## 2020-11-27 DIAGNOSIS — M9901 Segmental and somatic dysfunction of cervical region: Secondary | ICD-10-CM | POA: Diagnosis not present

## 2020-11-27 DIAGNOSIS — Z638 Other specified problems related to primary support group: Secondary | ICD-10-CM

## 2020-11-27 NOTE — Chronic Care Management (AMB) (Signed)
Chronic Care Management    Clinical Social Work Note  11/27/2020 Name: Abigail [place] MRN: [id number] DOB: 1945-08-23  Abigail Hat[place] is a 75 y.o. year old female who is a primary care patient of Steele Sizer, MD. The CCM team was consulted to assist the patient with chronic disease management and/or care coordination needs related to: Caregiver Stress.   Engaged with patient by telephone for follow up visit in response to provider referral for social work chronic care management and care coordination services.   Consent to Services:  The patient was given information about Chronic Care Management services, agreed to services, and gave verbal consent prior to initiation of services.  Please see initial visit note for detailed documentation.   Patient agreed to services and consent obtained.   Assessment: Review of patient past medical history, allergies, medications, and health status, including review of relevant consultants reports was performed today as part of a comprehensive evaluation and provision of chronic care management and care coordination services.     SDOH (Social Determinants of Health) assessments and interventions performed:    Advanced Directives Status: Not addressed in this encounter.  CCM Care Plan  Allergies  Allergen Reactions   Moxifloxacin Hcl In Nacl Swelling   Prednisone Other (See Comments)    Paranoid   Penicillins Rash and Other (See Comments)    Has patient had a PCN reaction causing immediate rash, facial/tongue/throat swelling, SOB or lightheadedness with hypotension: Unknown Has patient had a PCN reaction causing severe rash involving mucus membranes or skin necrosis: No Has patient had a PCN reaction that required hospitalization: Yes Has patient had a PCN reaction occurring within the last 10 years: No If all of the above answers are "NO", then may proceed with Cephalosporin use.     Outpatient Encounter Medications as of 11/27/2020   Medication Sig   Cholecalciferol (VITAMIN D3) 5000 UNITS CAPS Take 5,000 Units by mouth every other day.    conjugated estrogens (PREMARIN) vaginal cream Place 1 Applicatorful vaginally daily. One pea size on introitus   escitalopram (LEXAPRO) 5 MG tablet Take 1 tablet (5 mg total) by mouth daily.   gabapentin (NEURONTIN) 100 MG capsule TAKE 1 CAPSULE AT BEDTIME   IBU 600 MG tablet Take 600 mg by mouth every 6 (six) hours as needed.   OVER THE COUNTER MEDICATION 1 capsule 2 (two) times daily.    pyridoxine (B-6) 100 MG tablet Take 100 mg by mouth daily.   simvastatin (ZOCOR) 20 MG tablet TAKE ONE TABLET BY MOUTH AT BEDTIME   vitamin B-12 (CYANOCOBALAMIN) 1000 MCG tablet Take 1,000 mcg by mouth daily.   No facility-administered encounter medications on file as of 11/27/2020.    Patient Active Problem List   Diagnosis Date Noted   Dysthymia 09/05/2020   Hyperglycemia 09/05/2020   Low serum vitamin B12 09/05/2020   Chronic bilateral low back pain without sciatica 09/05/2020   Obesity (BMI 30.0-34.9) 12/07/2017   Degenerative joint disease of low back 12/07/2017   Senile purpura ([place]) 12/07/2017   Hyperlipidemia 12/21/2014    Conditions to be addressed/monitored: Depression;  caregiver stress  Care Plan : Depression (Adult)  Updates made by Vern Claude, LCSW since 11/27/2020 12:00 AM     Problem: Symptoms (Depression)      Goal: Symptoms Monitored and Managed   Start Date: 10/19/2020  Expected End Date: 05/22/2021  Recent Progress: On track  Priority: Medium  Note:   Current Barriers:  Chronic Mental Health needs related  to caregiver stress, family/relationship dysfunction Suicidal Ideation/Homicidal Ideation: No  Clinical Social Work Goal(s):  Over the next 90 days, patient will work with SW bi-weekly by telephone or in person to reduce or manage symptoms related to depression  Interventions: Patient interviewed and appropriate assessments performed: PHQ 2 PHQ 9 SDOH  Interventions    Flowsheet Row Most Recent Value  SDOH Interventions   Depression Interventions/Treatment  Medication, Counseling     Follow up with patient related to caregiver stress.  Patient's mother continues to have a caregiver at night, however patient and daughter assist with patient's care during the day Patient confirmed having some family struggles, coping strategies discussed along with need for boundary setting Patient continues to confirm that her care giving responsibilities are  stressful, however continue to be able to verbalize a good plan for care and positive family support Patient provided with reassurance and emotional support Positive self management options explored, self care emphasized Verbalization of feelings encouraged/ Active listening / Reflection utilized  Reviewed mental health medications with patient and discussed compliance: patient states that the Lexapro prescribed has been beneficial-dosage increased Appointment with Neurologist also discussed-Per patient and chart review "Symptoms most consistent with short term memory impairment caused by anxiety or depression. Very low suspicion for Alzheimer's dementia or other form of dementia at this time"  lf Care Activities:  Performs ADL's independently Performs IADL's independently Ability for insight  Patient Coping Strengths:  Supportive Relationships Spirituality Able to Communicate Effectively  Patient Self Care Deficits:  Self care limited due to caregiver responsibilities  Please see past updates related to this goal by clicking on the "Past Updates" button in the selected goal         Follow Up Plan: SW will follow up with patient by phone over the next 7-14 business days      [place], [place] Worker  [facility] [phone]

## 2020-11-27 NOTE — Telephone Encounter (Signed)
  Care Management   Follow Up Note   11/27/2020 Name: JAELENE DECH MRN: [id number] DOB: 24-May-1945   Referred by: Steele Sizer, MD Reason for referral : Care Coordination   An unsuccessful telephone outreach was attempted today. The patient was referred to the case management team for assistance with care management and care coordination.   Follow Up Plan: Telephone follow up appointment with care management team member scheduled for: 11/30/20  Elliot Gurney, [place] Worker  [facility] [phone]

## 2020-11-27 NOTE — Patient Instructions (Signed)
Visit Information   Goals Addressed             This Visit's Progress    Manage My Emotions       Timeframe:  Long-Range Goal Priority:  Medium Start Date:     10/19/20                        Expected End Date:    05/05/20                   Follow Up Date 12/07/20    - begin personal counseling if needed - call and visit an old friend - practice setting personal boundaries - practice relaxation or meditation daily - talk about feelings with a friend, family or spiritual advisor - practice positive thinking and self-talk    Why is this important?   When you are stressed, down or upset, your body reacts too.  For example, your blood pressure may get higher; you may have a headache or stomachache.  When your emotions get the best of you, your body's ability to fight off cold and flu gets weak.  These steps will help you manage your emotions.     Notes:         The patient verbalized understanding of instructions, educational materials, and care plan provided today and declined offer to receive copy of patient instructions, educational materials, and care plan.   Telephone follow up appointment with care management team member scheduled for:12/07/20  Elliot Gurney, [place] Worker  [facility] [phone]

## 2020-11-30 ENCOUNTER — Telehealth: Payer: PPO

## 2020-12-05 ENCOUNTER — Encounter: Payer: PPO | Admitting: Dermatology

## 2020-12-06 ENCOUNTER — Encounter: Payer: PPO | Admitting: Dermatology

## 2020-12-07 ENCOUNTER — Ambulatory Visit: Payer: PPO | Admitting: *Deleted

## 2020-12-07 DIAGNOSIS — F341 Dysthymic disorder: Secondary | ICD-10-CM

## 2020-12-07 DIAGNOSIS — Z638 Other specified problems related to primary support group: Secondary | ICD-10-CM

## 2020-12-07 NOTE — Chronic Care Management (AMB) (Signed)
Chronic Care Management    Clinical Social Work Note  12/07/2020 Name: Abigail Hatfield MRN: [id number] DOB: December 07, 1945  Abigail Hatfield is a 75 y.o. year old female who is a primary care patient of Steele Sizer, MD. The CCM team was consulted to assist the patient with chronic disease management and/or care coordination needs related to: [place] and Resources.   Engaged with patient by telephone for follow up visit in response to provider referral for social work chronic care management and care coordination services.   Consent to Services:  The patient was given information about Chronic Care Management services, agreed to services, and gave verbal consent prior to initiation of services.  Please see initial visit note for detailed documentation.   Patient agreed to services and consent obtained.   Assessment: Review of patient past medical history, allergies, medications, and health status, including review of relevant consultants reports was performed today as part of a comprehensive evaluation and provision of chronic care management and care coordination services.     SDOH (Social Determinants of Health) assessments and interventions performed:    Advanced Directives Status: Not addressed in this encounter.  CCM Care Plan  Allergies  Allergen Reactions   Moxifloxacin Hcl In Nacl Swelling   Prednisone Other (See Comments)    Paranoid   Penicillins Rash and Other (See Comments)    Has patient had a PCN reaction causing immediate rash, facial/tongue/throat swelling, SOB or lightheadedness with hypotension: Unknown Has patient had a PCN reaction causing severe rash involving mucus membranes or skin necrosis: No Has patient had a PCN reaction that required hospitalization: Yes Has patient had a PCN reaction occurring within the last 10 years: No If all of the above answers are "NO", then may proceed with Cephalosporin use.     Outpatient Encounter  Medications as of 12/07/2020  Medication Sig   Cholecalciferol (VITAMIN D3) 5000 UNITS CAPS Take 5,000 Units by mouth every other day.    conjugated estrogens (PREMARIN) vaginal cream Place 1 Applicatorful vaginally daily. One pea size on introitus   escitalopram (LEXAPRO) 5 MG tablet Take 1 tablet (5 mg total) by mouth daily.   gabapentin (NEURONTIN) 100 MG capsule TAKE 1 CAPSULE AT BEDTIME   IBU 600 MG tablet Take 600 mg by mouth every 6 (six) hours as needed.   OVER THE COUNTER MEDICATION 1 capsule 2 (two) times daily.    pyridoxine (B-6) 100 MG tablet Take 100 mg by mouth daily.   simvastatin (ZOCOR) 20 MG tablet TAKE ONE TABLET BY MOUTH AT BEDTIME   vitamin B-12 (CYANOCOBALAMIN) 1000 MCG tablet Take 1,000 mcg by mouth daily.   No facility-administered encounter medications on file as of 12/07/2020.    Patient Active Problem List   Diagnosis Date Noted   Dysthymia 09/05/2020   Hyperglycemia 09/05/2020   Low serum vitamin B12 09/05/2020   Chronic bilateral low back pain without sciatica 09/05/2020   Obesity (BMI 30.0-34.9) 12/07/2017   Degenerative joint disease of low back 12/07/2017   Senile purpura ([place]) 12/07/2017   Hyperlipidemia 12/21/2014    Conditions to be addressed/monitored: Depression; Mental Health Concerns   Care Plan : Depression (Adult)  Updates made by Vern Claude, LCSW since 12/07/2020 12:00 AM     Problem: Symptoms (Depression)      Goal: Symptoms Monitored and Managed   Start Date: 10/19/2020  Expected End Date: 05/22/2021  This Visit's Progress: On track  Recent Progress: On track  Priority: Medium  Note:  Current Barriers:  Chronic Mental Health needs related to caregiver stress, family/relationship dysfunction Suicidal Ideation/Homicidal Ideation: No  Clinical Social Work Goal(s):  Over the next 90 days, patient will work with SW bi-weekly by telephone or in person to reduce or manage symptoms related to depression  Interventions: SDOH  Interventions    Flowsheet Row Most Recent Value  SDOH Interventions   Depression Interventions/Treatment  Medication, Counseling     Follow up with patient related to caregiver stress. No longer has caregiver at night,  patient's daughter provides the majority of care. Patient has a sister that lives with her, been there for the last 3 years.  Plan now is for patient's sister to return home on 01/17/21 and patient and her  spouse will move in with patient to provide care. Patient confirmed continue family challenges, however is able to verbalize positive coping strategies to manage Patient continues to confirm that her care giving responsibilities are  stressful, however continue to be able to verbalize a good plan for care and positive family support Patient discussed  challenges in dealing with missing items(money, jewelry) from her purse and home Patient continues to remain compliant with her medications and confirmed finding the medication beneficial. Verbalization of feelings encouraged/ Active listening / Reflection utilized  Patient provided with reassurance and emotional support  lf Care Activities:  Performs ADL's independently Performs IADL's independently Ability for insight  Patient Coping Strengths:  Supportive Relationships Spirituality Able to Communicate Effectively  Patient Self Care Deficits:  Self care limited due to caregiver responsibilities  Please see past updates related to this goal by clicking on the "Past Updates" button in the selected goal         Follow Up Plan: SW will follow up with patient by phone over the next 14 business days      [place], [place] Worker  [facility] [phone]

## 2020-12-07 NOTE — Patient Instructions (Signed)
Visit Information   Goals Addressed             This Visit's Progress    Manage My Emotions       Timeframe:  Long-Range Goal Priority:  Medium Start Date:     10/19/20                        Expected End Date:    05/05/20                   Follow Up Date 12/21/20   - begin personal counseling if needed - call and visit an old friend - practice setting personal boundaries - practice relaxation or meditation daily - talk about feelings with a friend, family or spiritual advisor - practice positive thinking and self-talk    Why is this important?   When you are stressed, down or upset, your body reacts too.  For example, your blood pressure may get higher; you may have a headache or stomachache.  When your emotions get the best of you, your body's ability to fight off cold and flu gets weak.  These steps will help you manage your emotions.     Notes:         The patient verbalized understanding of instructions, educational materials, and care plan provided today and declined offer to receive copy of patient instructions, educational materials, and care plan.   Telephone follow up appointment with care management team member scheduled for:12/21/20  Elliot Gurney, [place] Worker  [facility] [phone]

## 2020-12-12 DIAGNOSIS — F341 Dysthymic disorder: Secondary | ICD-10-CM | POA: Diagnosis not present

## 2020-12-14 NOTE — Patient Instructions (Signed)
Preventive Care 40 Years and Older, Female Preventive care refers to lifestyle choices and visits with your health care provider that can promote health and wellness. This includes: A yearly physical exam. This is also called an annual wellness visit. Regular dental and eye exams. Immunizations. Screening for certain conditions. Healthy lifestyle choices, such as: Eating a healthy diet. Getting regular exercise. Not using drugs or products that contain nicotine and tobacco. Limiting alcohol use. What can I expect for my preventive care visit? Physical exam Your health care provider will check your: Height and weight. These may be used to calculate your BMI (body mass index). BMI is a measurement that tells if you are at a healthy weight. Heart rate and blood pressure. Body temperature. Skin for abnormal spots. Counseling Your health care provider may ask you questions about your: Past medical problems. Family's medical history. Alcohol, tobacco, and drug use. Emotional well-being. Home life and relationship well-being. Sexual activity. Diet, exercise, and sleep habits. History of falls. Memory and ability to understand (cognition). Work and work Statistician. Pregnancy and menstrual history. Access to firearms. What immunizations do I need? Vaccines are usually given at various ages, according to a schedule. Your health care provider will recommend vaccines for you based on your age, medical history, and lifestyle or other factors, such as travel or where you work. What tests do I need? Blood tests Lipid and cholesterol levels. These may be checked every 5 years, or more often depending on your overall health. Hepatitis C test. Hepatitis B test. Screening Lung cancer screening. You may have this screening every year starting at age 33 if you have a 30-pack-year history of smoking and currently smoke or have quit within the past 15 years. Colorectal cancer screening. All  adults should have this screening starting at age 73 and continuing until age 9. Your health care provider may recommend screening at age 31 if you are at increased risk. You will have tests every 1-10 years, depending on your results and the type of screening test. Diabetes screening. This is done by checking your blood sugar (glucose) after you have not eaten for a while (fasting). You may have this done every 1-3 years. Mammogram. This may be done every 1-2 years. Talk with your health care provider about how often you should have regular mammograms. Abdominal aortic aneurysm (AAA) screening. You may need this if you are a current or former smoker. BRCA-related cancer screening. This may be done if you have a family history of breast, ovarian, tubal, or peritoneal cancers. Other tests STD (sexually transmitted disease) testing, if you are at risk. Bone density scan. This is done to screen for osteoporosis. You may have this done starting at age 43. Talk with your health care provider about your test results, treatment options, and if necessary, the need for more tests. Follow these instructions at home: Eating and drinking  Eat a diet that includes fresh fruits and vegetables, whole grains, lean protein, and low-fat dairy products. Limit your intake of foods with high amounts of sugar, saturated fats, and salt. Take vitamin and mineral supplements as recommended by your health care provider. Do not drink alcohol if your health care provider tells you not to drink. If you drink alcohol: Limit how much you have to 0-1 drink a day. Be aware of how much alcohol is in your drink. In the [place] one drink equals one 12 oz bottle of beer (355 mL), one 5 oz glass of wine (148 mL), or one  1 oz glass of hard liquor (44 mL). Lifestyle Take daily care of your teeth and gums. Brush your teeth every morning and night with fluoride toothpaste. Floss one time each day. Stay active. Exercise for at least  30 minutes 5 or more days each week. Do not use any products that contain nicotine or tobacco, such as cigarettes, e-cigarettes, and chewing tobacco. If you need help quitting, ask your health care provider. Do not use drugs. If you are sexually active, practice safe sex. Use a condom or other form of protection in order to prevent STIs (sexually transmitted infections). Talk with your health care provider about taking a low-dose aspirin or statin. Find healthy ways to cope with stress, such as: Meditation, yoga, or listening to music. Journaling. Talking to a trusted person. Spending time with friends and family. Safety Always wear your seat belt while driving or riding in a vehicle. Do not drive: If you have been drinking alcohol. Do not ride with someone who has been drinking. When you are tired or distracted. While texting. Wear a helmet and other protective equipment during sports activities. If you have firearms in your house, make sure you follow all gun safety procedures. What's next? Visit your health care provider once a year for an annual wellness visit. Ask your health care provider how often you should have your eyes and teeth checked. Stay up to date on all vaccines. This information is not intended to replace advice given to you by your health care provider. Make sure you discuss any questions you have with your health care provider. Document Revised: 06/08/2020 Document Reviewed: 03/25/2018 Elsevier Patient Education  2022 [employer].

## 2020-12-14 NOTE — Progress Notes (Signed)
Name: Abigail Hatfield   MRN: [id number]    DOB: [birth year]   Date:12/18/2020       Progress Note  Subjective  Chief Complaint  Annual Exam  HPI  Patient presents for annual CPE.  Diet: she cooks at home, discussed healthy diet  Exercise: discussed 150 minutes per week.    Flowsheet Row Video Visit from 09/26/2020 in [facility]  AUDIT-C Score 0      Depression: Phq 9 is  negative Depression screen [facility] 2/9 12/18/2020 10/19/2020 10/10/2020 09/26/2020 09/05/2020  Decreased Interest 0 0 3 0 0  Down, Depressed, Hopeless 0 0 3 0 0  PHQ - 2 Score 0 0 6 0 0  Altered sleeping 0 0 0 0 0  Tired, decreased energy 0 0 0 0 0  Change in appetite 0 0 0 0 0  Feeling bad or failure about yourself  0 0 0 0 0  Trouble concentrating 0 0 0 0 0  Moving slowly or fidgety/restless 0 0 0 0 0  Suicidal thoughts 0 0 0 0 0  PHQ-9 Score 0 0 6 0 0  Difficult doing work/chores Not difficult at all - Very difficult - -  Some recent data might be hidden   She states she has been taking 10 mg of Lexapro and feeling well, we will send rx today  Hypertension: BP Readings from Last 3 Encounters:  12/18/20 130/70  11/08/20 116/78  10/10/20 130/76   Obesity: Wt Readings from Last 3 Encounters:  12/18/20 167 lb (75.8 kg)  11/08/20 162 lb 9.6 oz (73.8 kg)  10/10/20 164 lb (74.4 kg)   BMI Readings from Last 3 Encounters:  12/18/20 32.61 kg/m  11/08/20 31.76 kg/m  10/10/20 32.03 kg/m     Vaccines:   Shingrix: refused  Pneumonia: refused  [id number]   Hep C Screening: 11/26/16 STD testing and prevention (HIV/chl/gon/syphilis): N/A Intimate partner violence: negative Sexual History : husband has ED, not sexually active  Menstrual History/LMP/Abnormal Bleeding: discussed post-menopausal bleeding  Incontinence Symptoms: no problems   Breast cancer:  - Last Mammogram: 01/26/20 - BRCA gene screening: N/A  Osteoporosis: Discussed high calcium and vitamin D supplementation, weight  bearing exercises  Cervical cancer screening: N/A  Skin cancer: Discussed monitoring for atypical lesions  Colorectal cancer: 12/24/17   Lung cancer: Low Dose CT Chest recommended if Age 27-80 years, 20 pack-year currently smoking OR have quit w/in 15years. Patient does not qualify.   ECG: 06/07/18  Advanced Care Planning: A voluntary discussion about advance care planning including the explanation and discussion of advance directives.  Discussed health care proxy and Living will, and the patient was able to identify a health care proxy as daughter - Barnett Applebaum   Lipids: Lab Results  Component Value Date   CHOL 183 10/10/2020   CHOL 186 12/16/2019   CHOL 191 08/10/2019   Lab Results  Component Value Date   HDL 73 10/10/2020   HDL 73 12/16/2019   HDL 71 08/10/2019   Lab Results  Component Value Date   LDLCALC 90 10/10/2020   LDLCALC 93 12/16/2019   LDLCALC 99 08/10/2019   Lab Results  Component Value Date   TRIG 100 10/10/2020   TRIG 106 12/16/2019   TRIG 118 08/10/2019   Lab Results  Component Value Date   CHOLHDL 2.5 10/10/2020   CHOLHDL 2.5 12/16/2019   CHOLHDL 2.7 08/10/2019   No results found for: LDLDIRECT  Glucose: Glucose, Bld  Date Value Ref Range Status  10/10/2020 79 65 - 139 mg/dL Final    Comment:    .        Non-fasting reference interval .   12/16/2019 89 65 - 99 mg/dL Final    Comment:    .            Fasting reference interval .   08/10/2019 93 65 - 99 mg/dL Final    Comment:    .            Fasting reference interval .     Patient Active Problem List   Diagnosis Date Noted   Dysthymia 09/05/2020   Hyperglycemia 09/05/2020   Low serum vitamin B12 09/05/2020   Chronic bilateral low back pain without sciatica 09/05/2020   Obesity (BMI 30.0-34.9) 12/07/2017   Degenerative joint disease of low back 12/07/2017   Senile purpura ([place]) 12/07/2017   Hyperlipidemia 12/21/2014    Past Surgical History:  Procedure Laterality Date    ABDOMINAL HYSTERECTOMY  1980   CATARACT EXTRACTION W/PHACO Right 06/24/2017   Procedure: CATARACT EXTRACTION PHACO AND INTRAOCULAR LENS PLACEMENT ([place]);  Surgeon: Birder Robson, MD;  Location: [facility] ORS;  Service: Ophthalmology;  Laterality: Right;  [place] 00:48.0 AP% 16.5 CDE 7.92 Fluid Pack Lot # [id number]   CATARACT EXTRACTION W/PHACO Left 07/14/2017   Procedure: CATARACT EXTRACTION PHACO AND INTRAOCULAR LENS PLACEMENT ([facility]);  Surgeon: Birder Robson, MD;  Location: [facility] ORS;  Service: Ophthalmology;  Laterality: Left;  [place] 00:36.0 AP% 16.9 CDE 6.08 Fluid Pack Lot # [id number]   CESAREAN SECTION  1967   CESAREAN SECTION  1971   CHOLECYSTECTOMY  1972   COLONOSCOPY WITH PROPOFOL N/A 12/24/2017   Procedure: COLONOSCOPY WITH PROPOFOL;  Surgeon: Jonathon Bellows, MD;  Location: [facility] ENDOSCOPY;  Service: Gastroenterology;  Laterality: N/A;   EYE SURGERY     retal fistula repair     Dr. Jamal Collin x 2    Family History  Problem Relation Age of Onset   Diabetes Mother    Hypertension Mother    Heart attack Mother    Heart failure Father    Heart disease Father    Diabetes Brother    Hypertension Brother    COPD Brother    Diabetes Sister    Breast cancer Neg Hx     Social History   Socioeconomic History   Marital status: Married    Spouse name: Percell Miller   Number of children: 2   Years of education: some college   Highest education level: 12th grade  Occupational History   Occupation: Retired    Comment: since 04/2008  Tobacco Use   Smoking status: Former    Packs/day: 0.50    Years: 5.00    Pack years: 2.50    Types: Cigarettes    Quit date: 12/07/1988    Years since quitting: 32.0   Smokeless tobacco: Never   Tobacco comments:    smoking cessation materials not required  Vaping Use   Vaping Use: Never used  Substance and Sexual Activity   Alcohol use: No    Alcohol/week: 0.0 standard drinks   Drug use: No   Sexual activity: Not Currently    Partners: Male    Birth  control/protection: None    Comment: patient's husband has prostate issues so she cannot with him. has not in 41yr.  Other Topics Concern   Not on file  Social History Narrative   Mother is 950yrold that has dementia, she has a siActuaryut still checks on her  Husband has prostate issues and early dementia, he has been losing his patience and it has been stressful       Patient has 7 deteriorated disc   Social Determinants of Health   Financial Resource Strain: Low Risk    Difficulty of Paying Living Expenses: Not hard at all  Food Insecurity: No Food Insecurity   Worried About Charity fundraiser in the Last Year: Never true   [place] in the Last Year: Never true  Transportation Needs: No Transportation Needs   Lack of Transportation (Medical): No   Lack of Transportation (Non-Medical): No  Physical Activity: Inactive   Days of Exercise per Week: 0 days   Minutes of Exercise per Session: 0 min  Stress: No Stress Concern Present   Feeling of Stress : Only a little  Social Connections: Moderately Integrated   Frequency of Communication with Friends and Family: More than three times a week   Frequency of Social Gatherings with Friends and Family: Once a week   Attends Religious Services: More than 4 times per year   Active Member of [employer] or Organizations: No   Attends Music therapist: Never   Marital Status: Married  Human resources officer Violence: Not At Risk   Fear of Current or Ex-Partner: No   Emotionally Abused: No   Physically Abused: No   Sexually Abused: No     Current Outpatient Medications:    Cholecalciferol (VITAMIN D3) 5000 UNITS CAPS, Take 5,000 Units by mouth every other day. , Disp: , Rfl:    conjugated estrogens (PREMARIN) vaginal cream, Place 1 Applicatorful vaginally daily. One pea size on introitus, Disp: 42.5 g, Rfl: 12   gabapentin (NEURONTIN) 100 MG capsule, TAKE 1 CAPSULE AT BEDTIME, Disp: 90 capsule, Rfl: 1   IBU 600 MG  tablet, Take 600 mg by mouth every 6 (six) hours as needed., Disp: , Rfl:    OVER THE COUNTER MEDICATION, 1 capsule 2 (two) times daily. , Disp: , Rfl:    pyridoxine (B-6) 100 MG tablet, Take 100 mg by mouth daily., Disp: , Rfl:    simvastatin (ZOCOR) 20 MG tablet, TAKE ONE TABLET BY MOUTH AT BEDTIME, Disp: 90 tablet, Rfl: 0   vitamin B-12 (CYANOCOBALAMIN) 1000 MCG tablet, Take 1,000 mcg by mouth daily., Disp: , Rfl:    escitalopram (LEXAPRO) 10 MG tablet, Take 1 tablet (10 mg total) by mouth daily., Disp: 90 tablet, Rfl: 0  Allergies  Allergen Reactions   Moxifloxacin Hcl In Nacl Swelling   Prednisone Other (See Comments)    Paranoid   Penicillins Rash and Other (See Comments)    Has patient had a PCN reaction causing immediate rash, facial/tongue/throat swelling, SOB or lightheadedness with hypotension: Unknown Has patient had a PCN reaction causing severe rash involving mucus membranes or skin necrosis: No Has patient had a PCN reaction that required hospitalization: Yes Has patient had a PCN reaction occurring within the last 10 years: No If all of the above answers are "NO", then may proceed with Cephalosporin use.      ROS  Constitutional: Negative for fever or weight change.  Respiratory: Negative for cough and shortness of breath.   Cardiovascular: Negative for chest pain or palpitations.  Gastrointestinal: Negative for abdominal pain, no bowel changes.  Musculoskeletal: Negative for gait problem or joint swelling.  Skin: Negative for rash.  Neurological: Negative for dizziness or headache.  No other specific complaints in a complete review of systems (except as listed  in HPI above).   Objective  Vitals:   12/18/20 0841  BP: 130/70  Pulse: 64  Resp: 16  Temp: 98.1 F (36.7 C)  SpO2: 97%  Weight: 167 lb (75.8 kg)  Height: 5' (1.524 m)    Body mass index is 32.61 kg/m.  Physical Exam  Constitutional: Patient appears well-developed and well-nourished. No  distress.  HENT: Head: Normocephalic and atraumatic. Ears: B TMs ok, no erythema or effusion; Nose: Nose normal. Mouth/Throat: not  done  Eyes: Conjunctivae and EOM are normal. Pupils are equal, round, and reactive to light. No scleral icterus.  Neck: Normal range of motion. Neck supple. No JVD present. No thyromegaly present.  Cardiovascular: Normal rate, regular rhythm and normal heart sounds.  No murmur heard. No BLE edema. Pulmonary/Chest: Effort normal and breath sounds normal. No respiratory distress. Abdominal: Soft. Bowel sounds are normal, no distension. There is no tenderness. no masses Breast: no lumps or masses, no nipple discharge or rashes FEMALE GENITALIA:  Not done  RECTAL: not done  Musculoskeletal: pain with palpation of back, also pain with extension of back  Neurological: he is alert and oriented to person, place, and time. No cranial nerve deficit. Coordination, balance, strength, speech and gait are normal.  Skin: Skin is warm and dry. No rash noted. No erythema.  Psychiatric: Patient has a normal mood and affect. behavior is normal. Judgment and thought content normal.   Recent Results (from the past 2160 hour(s))  B12 and Folate Panel     Status: Abnormal   Collection Time: 10/10/20  8:51 AM  Result Value Ref Range   Vitamin B-12 1,291 (H) 200 - 1,100 pg/mL   Folate >24.0 ng/mL    Comment:                            Reference Range                            Low:           <3.4                            Borderline:    3.4-5.4                            Normal:        >5.4 .   Lipid panel     Status: None   Collection Time: 10/10/20  8:51 AM  Result Value Ref Range   Cholesterol 183 <200 mg/dL   HDL 73 > OR = 50 mg/dL   Triglycerides 100 <150 mg/dL   LDL Cholesterol (Calc) 90 mg/dL (calc)    Comment: Reference range: <100 . Desirable range <100 mg/dL for primary prevention;   <70 mg/dL for patients with CHD or diabetic patients  with > or = 2 CHD risk  factors. Marland Kitchen LDL-C is now calculated using the Martin-Hopkins  calculation, which is a validated novel method providing  better accuracy than the Friedewald equation in the  estimation of LDL-C.  Cresenciano Genre et al. Annamaria Helling. 3419;622(29): 2061-2068  (http://education.[facility].com/faq/FAQ164)    Total CHOL/HDL Ratio 2.5 <5.0 (calc)   Non-HDL Cholesterol (Calc) 110 <130 mg/dL (calc)    Comment: For patients with diabetes plus 1 major ASCVD risk  factor, treating to a non-HDL-C goal of <100 mg/dL  (  LDL-C of <70 mg/dL) is considered a therapeutic  option.   COMPLETE METABOLIC PANEL WITH GFR     Status: None   Collection Time: 10/10/20  8:51 AM  Result Value Ref Range   Glucose, Bld 79 65 - 139 mg/dL    Comment: .        Non-fasting reference interval .    BUN 18 7 - 25 mg/dL   Creat 0.82 0.60 - 0.93 mg/dL    Comment: For patients >70 years of age, the reference limit for Creatinine is approximately 13% higher for people identified as African-American. .    GFR, Est Non African American 70 > OR = 60 mL/min/1.88m   GFR, Est African American 81 > OR = 60 mL/min/1.761m  BUN/Creatinine Ratio NOT APPLICABLE 6 - 22 (calc)   Sodium 142 135 - 146 mmol/L   Potassium 4.6 3.5 - 5.3 mmol/L   Chloride 105 98 - 110 mmol/L   CO2 30 20 - 32 mmol/L   Calcium 9.5 8.6 - 10.4 mg/dL   Total Protein 7.1 6.1 - 8.1 g/dL   Albumin 4.4 3.6 - 5.1 g/dL   Globulin 2.7 1.9 - 3.7 g/dL (calc)   AG Ratio 1.6 1.0 - 2.5 (calc)   Total Bilirubin 0.3 0.2 - 1.2 mg/dL   Alkaline phosphatase (APISO) 48 37 - 153 U/L   AST 23 10 - 35 U/L   ALT 16 6 - 29 U/L  CBC with Differential/Platelet     Status: None   Collection Time: 10/10/20  8:51 AM  Result Value Ref Range   WBC 5.2 3.8 - 10.8 Thousand/uL   RBC 4.10 3.80 - 5.10 Million/uL   Hemoglobin 12.8 11.7 - 15.5 g/dL   HCT 39.0 35.0 - 45.0 %   MCV 95.1 80.0 - 100.0 fL   MCH 31.2 27.0 - 33.0 pg   MCHC 32.8 32.0 - 36.0 g/dL   RDW 12.7 11.0 - 15.0 %    Platelets 307 140 - 400 Thousand/uL   MPV 10.5 7.5 - 12.5 fL   Neutro Abs 2,917 1,500 - 7,800 cells/uL   Lymphs Abs 1,711 850 - 3,900 cells/uL   Absolute Monocytes 333 200 - 950 cells/uL   Eosinophils Absolute 151 15 - 500 cells/uL   Basophils Absolute 88 0 - 200 cells/uL   Neutrophils Relative % 56.1 %   Total Lymphocyte 32.9 %   Monocytes Relative 6.4 %   Eosinophils Relative 2.9 %   Basophils Relative 1.7 %  TSH     Status: None   Collection Time: 10/10/20  8:51 AM  Result Value Ref Range   TSH 2.12 0.40 - 4.50 mIU/L    Fall Risk: Fall Risk  12/18/2020 10/10/2020 09/26/2020 09/05/2020 02/09/2020  Falls in the past year? 0 0 0 0 0  Number falls in past yr: 0 0 0 0 0  Injury with Fall? 0 0 0 0 0  Risk for fall due to : No Fall Risks - - - No Fall Risks  Risk for fall due to: Comment - - - - -  Follow up Falls prevention discussed - Falls prevention discussed Falls prevention discussed Falls prevention discussed     Functional Status Survey: Is the patient deaf or have difficulty hearing?: No Does the patient have difficulty seeing, even when wearing glasses/contacts?: No Does the patient have difficulty concentrating, remembering, or making decisions?: No Does the patient have difficulty walking or climbing stairs?: No Does the patient have difficulty dressing  or bathing?: No Does the patient have difficulty doing errands alone such as visiting a doctor's office or shopping?: No   Assessment & Plan  1. Well adult exam   2. Needs flu shot  Refused  3. Need for pneumococcal vaccine  Refused   4. Breast cancer screening by mammogram  - MM 3D SCREEN BREAST BILATERAL; Future  5. Dysthymia  - escitalopram (LEXAPRO) 10 MG tablet; Take 1 tablet (10 mg total) by mouth daily.  Dispense: 90 tablet; Refill: 0    -USPSTF grade A and B recommendations reviewed with patient; age-appropriate recommendations, preventive care, screening tests, etc discussed and encouraged;  healthy living encouraged; see AVS for patient education given to patient -Discussed importance of 150 minutes of physical activity weekly, eat two servings of fish weekly, eat one serving of tree nuts ( cashews, pistachios, pecans, almonds.Marland Kitchen) every other day, eat 6 servings of fruit/vegetables daily and drink plenty of water and avoid sweet beverages.

## 2020-12-18 ENCOUNTER — Ambulatory Visit (INDEPENDENT_AMBULATORY_CARE_PROVIDER_SITE_OTHER): Payer: PPO | Admitting: Family Medicine

## 2020-12-18 ENCOUNTER — Encounter: Payer: Self-pay | Admitting: Family Medicine

## 2020-12-18 ENCOUNTER — Other Ambulatory Visit: Payer: Self-pay

## 2020-12-18 VITALS — BP 130/70 | HR 64 | Temp 98.1°F | Resp 16 | Ht 60.0 in | Wt 167.0 lb

## 2020-12-18 DIAGNOSIS — Z1231 Encounter for screening mammogram for malignant neoplasm of breast: Secondary | ICD-10-CM | POA: Diagnosis not present

## 2020-12-18 DIAGNOSIS — F341 Dysthymic disorder: Secondary | ICD-10-CM | POA: Diagnosis not present

## 2020-12-18 DIAGNOSIS — Z23 Encounter for immunization: Secondary | ICD-10-CM | POA: Diagnosis not present

## 2020-12-18 DIAGNOSIS — Z0000 Encounter for general adult medical examination without abnormal findings: Secondary | ICD-10-CM

## 2020-12-18 MED ORDER — ESCITALOPRAM OXALATE 10 MG PO TABS: 10.0000 mg | ORAL_TABLET | Freq: Every day | ORAL | 0 refills | Status: DC

## 2020-12-21 ENCOUNTER — Ambulatory Visit (INDEPENDENT_AMBULATORY_CARE_PROVIDER_SITE_OTHER): Payer: PPO | Admitting: *Deleted

## 2020-12-21 DIAGNOSIS — F341 Dysthymic disorder: Secondary | ICD-10-CM

## 2020-12-21 DIAGNOSIS — Z638 Other specified problems related to primary support group: Secondary | ICD-10-CM

## 2020-12-21 DIAGNOSIS — R4189 Other symptoms and signs involving cognitive functions and awareness: Secondary | ICD-10-CM

## 2020-12-21 NOTE — Patient Instructions (Signed)
Visit Information   Goals Addressed             This Visit's Progress    Manage My Emotions       Timeframe:  Long-Range Goal Priority:  Medium Start Date:     10/19/20                        Expected End Date:    05/05/21                Follow Up Date 01/18/21   - begin personal counseling if needed - call and visit an old friend - practice setting personal boundaries - practice relaxation or meditation daily - talk about feelings with a friend, family or spiritual advisor - practice positive thinking and self-talk    Why is this important?   When you are stressed, down or upset, your body reacts too.  For example, your blood pressure may get higher; you may have a headache or stomachache.  When your emotions get the best of you, your body's ability to fight off cold and flu gets weak.  These steps will help you manage your emotions.     Notes:         The patient verbalized understanding of instructions, educational materials, and care plan provided today and declined offer to receive copy of patient instructions, educational materials, and care plan.   Telephone follow up appointment with care management team member scheduled for: 01/18/21  Elliot Gurney, [place] Worker  [facility] Care Management [phone]

## 2020-12-21 NOTE — Chronic Care Management (AMB) (Signed)
Chronic Care Management    Clinical Social Work Note  12/21/2020 Name: Abigail Hatfield MRN: [id number] DOB: July 16, 1945  Abigail Hatfield is a 75 y.o. year old female who is a primary care patient of Steele Sizer, MD. The CCM team was consulted to assist the patient with chronic disease management and/or care coordination needs related to: [place] and Resources.   Engaged with patient by telephone for follow up visit in response to provider referral for social work chronic care management and care coordination services.   Consent to Services:  The patient was given information about Chronic Care Management services, agreed to services, and gave verbal consent prior to initiation of services.  Please see initial visit note for detailed documentation.   Patient agreed to services and consent obtained.   Assessment: Review of patient past medical history, allergies, medications, and health status, including review of relevant consultants reports was performed today as part of a comprehensive evaluation and provision of chronic care management and care coordination services.     SDOH (Social Determinants of Health) assessments and interventions performed:    Advanced Directives Status: Not addressed in this encounter.  CCM Care Plan  Allergies  Allergen Reactions   Moxifloxacin Hcl In Nacl Swelling   Prednisone Other (See Comments)    Paranoid   Penicillins Rash and Other (See Comments)    Has patient had a PCN reaction causing immediate rash, facial/tongue/throat swelling, SOB or lightheadedness with hypotension: Unknown Has patient had a PCN reaction causing severe rash involving mucus membranes or skin necrosis: No Has patient had a PCN reaction that required hospitalization: Yes Has patient had a PCN reaction occurring within the last 10 years: No If all of the above answers are "NO", then may proceed with Cephalosporin use.     Outpatient Encounter Medications  as of 12/21/2020  Medication Sig   Cholecalciferol (VITAMIN D3) 5000 UNITS CAPS Take 5,000 Units by mouth every other day.    conjugated estrogens (PREMARIN) vaginal cream Place 1 Applicatorful vaginally daily. One pea size on introitus   escitalopram (LEXAPRO) 10 MG tablet Take 1 tablet (10 mg total) by mouth daily.   gabapentin (NEURONTIN) 100 MG capsule TAKE 1 CAPSULE AT BEDTIME   IBU 600 MG tablet Take 600 mg by mouth every 6 (six) hours as needed.   OVER THE COUNTER MEDICATION 1 capsule 2 (two) times daily.    pyridoxine (B-6) 100 MG tablet Take 100 mg by mouth daily.   simvastatin (ZOCOR) 20 MG tablet TAKE ONE TABLET BY MOUTH AT BEDTIME   vitamin B-12 (CYANOCOBALAMIN) 1000 MCG tablet Take 1,000 mcg by mouth daily.   No facility-administered encounter medications on file as of 12/21/2020.    Patient Active Problem List   Diagnosis Date Noted   Dysthymia 09/05/2020   Hyperglycemia 09/05/2020   Low serum vitamin B12 09/05/2020   Chronic bilateral low back pain without sciatica 09/05/2020   Obesity (BMI 30.0-34.9) 12/07/2017   Degenerative joint disease of low back 12/07/2017   Senile purpura ([place]) 12/07/2017   Hyperlipidemia 12/21/2014    Conditions to be addressed/monitored: Depression; Mental Health Concerns   Care Plan : Depression (Adult)  Updates made by Vern Claude, LCSW since 12/21/2020 12:00 AM     Problem: Symptoms (Depression)      Goal: Symptoms Monitored and Managed   Start Date: 10/19/2020  Expected End Date: 05/22/2021  This Visit's Progress: On track  Recent Progress: On track  Priority: Medium  Note:  Current Barriers:  Chronic Mental Health needs related to caregiver stress, family/relationship dysfunction Suicidal Ideation/Homicidal Ideation: No  Clinical Social Work Goal(s):  Over the next 90 days, patient will work with SW bi-weekly by telephone or in person to reduce or manage symptoms related to depression  Interventions: SDOH Interventions     Flowsheet Row Most Recent Value  SDOH Interventions   Depression Interventions/Treatment  Medication, Counseling     Follow up with patient related to caregiver stress-patient confirms that care giving duties are going well, plan now is for patient's mother's sister to return home on 01/17/21 and patient and her spouse will move in with patient full time to provide care. Patient confirmed continue family challenges, however is able to demonstrate strong boundaries and verbalize positive coping strategies to manage Patient continues to confirm that her care giving responsibilities are  stressful, however continues to be able to verbalize a good plan for care and positive family support Patient continues to remain compliant with her medications and confirmed finding the medication beneficial. Verbalization of feelings encouraged/ Active listening / Reflection utilized  Patient provided with reassurance and emotional support  lf Care Activities:  Performs ADL's independently Performs IADL's independently Ability for insight  Patient Coping Strengths:  Supportive Relationships Spirituality Able to Communicate Effectively  Patient Self Care Deficits:  Self care limited due to caregiver responsibilities  Please see past updates related to this goal by clicking on the "Past Updates" button in the selected goal         Follow Up Plan: SW will follow up with patient by phone over the next 30 days  due to progress made      [place], [place] Worker  [facility] Care Management [phone]

## 2020-12-25 DIAGNOSIS — M5033 Other cervical disc degeneration, cervicothoracic region: Secondary | ICD-10-CM | POA: Diagnosis not present

## 2020-12-25 DIAGNOSIS — M62830 Muscle spasm of back: Secondary | ICD-10-CM | POA: Diagnosis not present

## 2020-12-25 DIAGNOSIS — M9903 Segmental and somatic dysfunction of lumbar region: Secondary | ICD-10-CM | POA: Diagnosis not present

## 2020-12-25 DIAGNOSIS — M9901 Segmental and somatic dysfunction of cervical region: Secondary | ICD-10-CM | POA: Diagnosis not present

## 2020-12-26 ENCOUNTER — Other Ambulatory Visit: Payer: Self-pay | Admitting: Family Medicine

## 2021-01-10 DIAGNOSIS — F4321 Adjustment disorder with depressed mood: Secondary | ICD-10-CM | POA: Diagnosis not present

## 2021-01-10 DIAGNOSIS — R413 Other amnesia: Secondary | ICD-10-CM | POA: Diagnosis not present

## 2021-01-10 DIAGNOSIS — F411 Generalized anxiety disorder: Secondary | ICD-10-CM | POA: Diagnosis not present

## 2021-01-11 DIAGNOSIS — F341 Dysthymic disorder: Secondary | ICD-10-CM

## 2021-01-15 DIAGNOSIS — M5033 Other cervical disc degeneration, cervicothoracic region: Secondary | ICD-10-CM | POA: Diagnosis not present

## 2021-01-15 DIAGNOSIS — M62830 Muscle spasm of back: Secondary | ICD-10-CM | POA: Diagnosis not present

## 2021-01-15 DIAGNOSIS — M9901 Segmental and somatic dysfunction of cervical region: Secondary | ICD-10-CM | POA: Diagnosis not present

## 2021-01-15 DIAGNOSIS — M9903 Segmental and somatic dysfunction of lumbar region: Secondary | ICD-10-CM | POA: Diagnosis not present

## 2021-01-18 ENCOUNTER — Ambulatory Visit (INDEPENDENT_AMBULATORY_CARE_PROVIDER_SITE_OTHER): Payer: PPO | Admitting: *Deleted

## 2021-01-18 DIAGNOSIS — Z638 Other specified problems related to primary support group: Secondary | ICD-10-CM

## 2021-01-18 DIAGNOSIS — R4189 Other symptoms and signs involving cognitive functions and awareness: Secondary | ICD-10-CM

## 2021-01-18 DIAGNOSIS — F341 Dysthymic disorder: Secondary | ICD-10-CM

## 2021-01-18 NOTE — Chronic Care Management (AMB) (Signed)
Chronic Care Management    Clinical Social Work Note  01/18/2021 Name: Abigail Hatfield MRN: [id number] DOB: January 11, 1946  Abigail Hatfield is a 75 y.o. year old female who is a primary care patient of Steele Sizer, MD. The CCM team was consulted to assist the patient with chronic disease management and/or care coordination needs related to: Mental Health Counseling and Resources.   Engaged with patient by telephone for follow up visit in response to provider referral for social work chronic care management and care coordination services.   Consent to Services:  The patient was given information about Chronic Care Management services, agreed to services, and gave verbal consent prior to initiation of services.  Please see initial visit note for detailed documentation.   Patient agreed to services and consent obtained.   Assessment: Review of patient past medical history, allergies, medications, and health status, including review of relevant consultants reports was performed today as part of a comprehensive evaluation and provision of chronic care management and care coordination services.     SDOH (Social Determinants of Health) assessments and interventions performed:    Advanced Directives Status: Not addressed in this encounter.  CCM Care Plan  Allergies  Allergen Reactions   Moxifloxacin Hcl In Nacl Swelling   Prednisone Other (See Comments)    Paranoid   Penicillins Rash and Other (See Comments)    Has patient had a PCN reaction causing immediate rash, facial/tongue/throat swelling, SOB or lightheadedness with hypotension: Unknown Has patient had a PCN reaction causing severe rash involving mucus membranes or skin necrosis: No Has patient had a PCN reaction that required hospitalization: Yes Has patient had a PCN reaction occurring within the last 10 years: No If all of the above answers are "NO", then may proceed with Cephalosporin use.     Outpatient Encounter  Medications as of 01/18/2021  Medication Sig   Cholecalciferol (VITAMIN D3) 5000 UNITS CAPS Take 5,000 Units by mouth every other day.    conjugated estrogens (PREMARIN) vaginal cream Place 1 Applicatorful vaginally daily. One pea size on introitus   escitalopram (LEXAPRO) 10 MG tablet Take 1 tablet (10 mg total) by mouth daily.   gabapentin (NEURONTIN) 100 MG capsule TAKE 1 CAPSULE AT BEDTIME   IBU 600 MG tablet Take 600 mg by mouth every 6 (six) hours as needed.   OVER THE COUNTER MEDICATION 1 capsule 2 (two) times daily.    pyridoxine (B-6) 100 MG tablet Take 100 mg by mouth daily.   simvastatin (ZOCOR) 20 MG tablet TAKE ONE TABLET BY MOUTH AT BEDTIME   vitamin B-12 (CYANOCOBALAMIN) 1000 MCG tablet Take 1,000 mcg by mouth daily.   No facility-administered encounter medications on file as of 01/18/2021.    Patient Active Problem List   Diagnosis Date Noted   Dysthymia 09/05/2020   Hyperglycemia 09/05/2020   Low serum vitamin B12 09/05/2020   Chronic bilateral low back pain without sciatica 09/05/2020   Obesity (BMI 30.0-34.9) 12/07/2017   Degenerative joint disease of low back 12/07/2017   Senile purpura ([place]) 12/07/2017   Hyperlipidemia 12/21/2014    Conditions to be addressed/monitored: Depression; Mental Health Concerns   Care Plan : Depression (Adult)  Updates made by Vern Claude, LCSW since 01/18/2021 12:00 AM     Problem: Symptoms (Depression)      Goal: Symptoms Monitored and Managed   Start Date: 10/19/2020  Expected End Date: 05/22/2021  This Visit's Progress: On track  Recent Progress: On track  Priority: Medium  Note:  Current Barriers:  Chronic Mental Health needs related to caregiver stress, family/relationship dysfunction Suicidal Ideation/Homicidal Ideation: No  Clinical Social Work Goal(s):  Over the next 90 days, patient will work with SW bi-weekly by telephone or in person to reduce or manage symptoms related to depression  Interventions: SDOH  Interventions    Flowsheet Row Most Recent Value  SDOH Interventions   Depression Interventions/Treatment  Medication, Counseling     Follow up with patient related to caregiver stress-patient confirms that care giving duties are going well, patient now in the process of moving to her mother's home to provide full time care as patient's mother's sister has returned home. Patient confirmed continue family challenges, however is able to demonstrate strong boundaries and verbalize positive coping strategies to manage Patient continues to confirm that her care giving responsibilities are  stressful, however continues to be able to verbalize a good plan for care and positive family support Patient continues to remain compliant with her medications, per patient tolerating the increase in dosage well Verbalization of feelings encouraged/ Active listening / Reflection utilized  Caregiver stress acknowledged, patient provided with positive reinforcement and ongoing emotional support  lf Care Activities:  Performs ADL's independently Performs IADL's independently Ability for insight  Patient Coping Strengths:  Supportive Relationships Spirituality Able to Communicate Effectively  Patient Self Care Deficits:  Self care limited due to caregiver responsibilities  Please see past updates related to this goal by clicking on the "Past Updates" button in the selected goal         Follow Up Plan: SW will follow up with patient by phone over the next 14 business days      [place], [place] Worker  [facility] [phone]

## 2021-01-18 NOTE — Patient Instructions (Signed)
Visit Information   Goals Addressed             This Visit's Progress    Manage My Emotions       Timeframe:  Long-Range Goal Priority:  Medium Start Date:     10/19/20                        Expected End Date:    05/05/21                Follow Up Date 02/01/21   - begin personal counseling if needed - call and visit an old friend - practice setting personal boundaries - practice relaxation or meditation daily - talk about feelings with a friend, family or spiritual advisor - practice positive thinking and self-talk    Why is this important?   When you are stressed, down or upset, your body reacts too.  For example, your blood pressure may get higher; you may have a headache or stomachache.  When your emotions get the best of you, your body's ability to fight off cold and flu gets weak.  These steps will help you manage your emotions.     Notes:         The patient verbalized understanding of instructions, educational materials, and care plan provided today and declined offer to receive copy of patient instructions, educational materials, and care plan.   Telephone follow up appointment with care management team member scheduled for: 02/01/21  Elliot Gurney, [place] Worker  [facility] [phone]

## 2021-01-22 DIAGNOSIS — M9901 Segmental and somatic dysfunction of cervical region: Secondary | ICD-10-CM | POA: Diagnosis not present

## 2021-01-22 DIAGNOSIS — M62830 Muscle spasm of back: Secondary | ICD-10-CM | POA: Diagnosis not present

## 2021-01-22 DIAGNOSIS — M5033 Other cervical disc degeneration, cervicothoracic region: Secondary | ICD-10-CM | POA: Diagnosis not present

## 2021-01-22 DIAGNOSIS — M9903 Segmental and somatic dysfunction of lumbar region: Secondary | ICD-10-CM | POA: Diagnosis not present

## 2021-01-28 ENCOUNTER — Other Ambulatory Visit: Payer: Self-pay

## 2021-01-28 ENCOUNTER — Ambulatory Visit
Admission: RE | Admit: 2021-01-28 | Discharge: 2021-01-28 | Disposition: A | Discharge status: Home / Self Care | Payer: PPO | Source: Ambulatory Visit | Attending: Family Medicine | Admitting: Family Medicine

## 2021-01-28 DIAGNOSIS — Z1231 Encounter for screening mammogram for malignant neoplasm of breast: Secondary | ICD-10-CM | POA: Insufficient documentation

## 2021-02-01 ENCOUNTER — Ambulatory Visit: Payer: PPO | Admitting: *Deleted

## 2021-02-01 DIAGNOSIS — R4189 Other symptoms and signs involving cognitive functions and awareness: Secondary | ICD-10-CM

## 2021-02-01 DIAGNOSIS — Z638 Other specified problems related to primary support group: Secondary | ICD-10-CM

## 2021-02-01 DIAGNOSIS — F341 Dysthymic disorder: Secondary | ICD-10-CM

## 2021-02-01 NOTE — Patient Instructions (Signed)
Visit Information   Goals Addressed             This Visit's Progress    Manage My Emotions       Timeframe:  Long-Range Goal Priority:  Medium Start Date:     10/19/20                        Expected End Date:    02/01/21                Follow Up Date 02/01/21   - begin personal counseling if needed - call and visit an old friend - practice setting personal boundaries - practice relaxation or meditation daily - talk about feelings with a friend, family or spiritual advisor - practice positive thinking and self-talk    Why is this important?   When you are stressed, down or upset, your body reacts too.  For example, your blood pressure may get higher; you may have a headache or stomachache.  When your emotions get the best of you, your body's ability to fight off cold and flu gets weak.  These steps will help you manage your emotions.     Notes:         The patient verbalized understanding of instructions, educational materials, and care plan provided today and declined offer to receive copy of patient instructions, educational materials, and care plan.   No further follow up required: patient to contact this Education officer, museum with any additional community resource needs  [employer], [place] Worker  [facility] [phone]

## 2021-02-01 NOTE — Chronic Care Management (AMB) (Signed)
Chronic Care Management    Clinical Social Work Note  02/01/2021 Name: Abigail Hatfield MRN: [id number] DOB: 18-Apr-1945  Abigail Hatfield is a 75 y.o. year old female who is a primary care patient of Steele Sizer, MD. The CCM team was consulted to assist the patient with chronic disease management and/or care coordination needs related to: [place] and Resources.   Engaged with patient by telephone for follow up visit in response to provider referral for social work chronic care management and care coordination services.   Consent to Services:  The patient was given information about Chronic Care Management services, agreed to services, and gave verbal consent prior to initiation of services.  Please see initial visit note for detailed documentation.   Patient agreed to services and consent obtained.   Assessment: Review of patient past medical history, allergies, medications, and health status, including review of relevant consultants reports was performed today as part of a comprehensive evaluation and provision of chronic care management and care coordination services.     SDOH (Social Determinants of Health) assessments and interventions performed:    Advanced Directives Status: Not addressed in this encounter.  CCM Care Plan  Allergies  Allergen Reactions   Moxifloxacin Hcl In Nacl Swelling   Prednisone Other (See Comments)    Paranoid   Penicillins Rash and Other (See Comments)    Has patient had a PCN reaction causing immediate rash, facial/tongue/throat swelling, SOB or lightheadedness with hypotension: Unknown Has patient had a PCN reaction causing severe rash involving mucus membranes or skin necrosis: No Has patient had a PCN reaction that required hospitalization: Yes Has patient had a PCN reaction occurring within the last 10 years: No If all of the above answers are "NO", then may proceed with Cephalosporin use.     Outpatient Encounter  Medications as of 02/01/2021  Medication Sig   Cholecalciferol (VITAMIN D3) 5000 UNITS CAPS Take 5,000 Units by mouth every other day.    conjugated estrogens (PREMARIN) vaginal cream Place 1 Applicatorful vaginally daily. One pea size on introitus   escitalopram (LEXAPRO) 10 MG tablet Take 1 tablet (10 mg total) by mouth daily.   gabapentin (NEURONTIN) 100 MG capsule TAKE 1 CAPSULE AT BEDTIME   IBU 600 MG tablet Take 600 mg by mouth every 6 (six) hours as needed.   OVER THE COUNTER MEDICATION 1 capsule 2 (two) times daily.    pyridoxine (B-6) 100 MG tablet Take 100 mg by mouth daily.   simvastatin (ZOCOR) 20 MG tablet TAKE ONE TABLET BY MOUTH AT BEDTIME   vitamin B-12 (CYANOCOBALAMIN) 1000 MCG tablet Take 1,000 mcg by mouth daily.   No facility-administered encounter medications on file as of 02/01/2021.    Patient Active Problem List   Diagnosis Date Noted   Dysthymia 09/05/2020   Hyperglycemia 09/05/2020   Low serum vitamin B12 09/05/2020   Chronic bilateral low back pain without sciatica 09/05/2020   Obesity (BMI 30.0-34.9) 12/07/2017   Degenerative joint disease of low back 12/07/2017   Senile purpura ([place]) 12/07/2017   Hyperlipidemia 12/21/2014    Conditions to be addressed/monitored: Depression; Mental Health Concerns   Care Plan : Depression (Adult)  Updates made by Vern Claude, LCSW since 02/01/2021 12:00 AM     Problem: Symptoms (Depression)      Goal: Symptoms Monitored and Managed   Start Date: 10/19/2020  Expected End Date: 05/22/2021  Recent Progress: On track  Priority: Medium  Note:   Current Barriers:  Chronic  Mental Health needs related to caregiver stress, family/relationship dysfunction Suicidal Ideation/Homicidal Ideation: No   Clinical Social Work Goal(s):  Over the next 90 days, patient will work with SW bi-weekly by telephone or in person to reduce or manage symptoms related to depression  Interventions: SDOH Interventions    Flowsheet Row  Most Recent Value  SDOH Interventions   Depression Interventions/Treatment  Medication, Counseling     Follow up with patient related to caregiver stress- confirmed move to her mother's home to provide full time care Confirmed continued family challenges, however continues to verbalize the practice of strong boundaries and  positive coping strategies to manage Patient continues to confirm that her care giving responsibilities are  stressful, however continues to be able to verbalize a good plan for care and positive family support-patient has a housekeeper to coming in regularly and family to assist with meals Patient continues to remain compliant with her medications, per patient tolerating the increase in dosage well Verbalization of feelings encouraged/ Active listening / Reflection utilized  Caregiver stress acknowledged, patient provided with positive reinforcement and ongoing emotional support Patient confirmed  doing well currently-no additional social work involvement needed at this time- This social worker's contact information provided if needed in the future  lf Care Activities:  Performs ADL's independently Performs IADL's independently Ability for insight  Patient Coping Strengths:  Supportive Relationships Spirituality Able to Communicate Effectively  Patient Self Care Deficits:  Self care limited due to caregiver responsibilities  Please see past updates related to this goal by clicking on the "Past Updates" button in the selected goal         Follow Up Plan: Client will contact this social worker with any additional mental health needs      Elliot Gurney, [place] Worker  [facility] [phone]

## 2021-02-11 DIAGNOSIS — F341 Dysthymic disorder: Secondary | ICD-10-CM

## 2021-02-12 ENCOUNTER — Ambulatory Visit (INDEPENDENT_AMBULATORY_CARE_PROVIDER_SITE_OTHER): Payer: PPO

## 2021-02-12 DIAGNOSIS — Z780 Asymptomatic menopausal state: Secondary | ICD-10-CM

## 2021-02-12 DIAGNOSIS — Z0000 Encounter for general adult medical examination without abnormal findings: Secondary | ICD-10-CM

## 2021-02-12 NOTE — Progress Notes (Signed)
Subjective:   Abigail Hatfield is a 75 y.o. female who presents for Medicare Annual (Subsequent) preventive examination.  Virtual Visit via Telephone Note  I connected with  Abigail Hatfield on 02/12/21 at 10:00 AM EDT by telephone and verified that I am speaking with the correct person using two identifiers.  Location: Patient: home Provider: [place] Persons participating in the virtual visit: [place]   I discussed the limitations, risks, security and privacy concerns of performing an evaluation and management service by telephone and the availability of in person appointments. The patient expressed understanding and agreed to proceed.  Interactive audio and video telecommunications were attempted between this nurse and patient, however failed, due to patient having technical difficulties OR patient did not have access to video capability.  We continued and completed visit with audio only.  Some vital signs may be absent or patient reported.   Clemetine Marker, LPN   Review of Systems     Cardiac Risk Factors include: dyslipidemia;hypertension     Objective:    There were no vitals filed for this visit. There is no height or weight on file to calculate BMI.  Advanced Directives 02/12/2021 02/09/2020 12/07/2018 06/06/2018 12/24/2017 12/08/2017 07/14/2017  Does Patient Have a Medical Advance Directive? Yes Yes Yes No Yes Yes Yes  Type of Paramedic of [place];Living will [place];Living will [place];Living will - [place];Living will [place];Living will [place];Living will  Does patient want to make changes to medical advance directive? - - - - - - No - Patient declined  Copy of [place] in Chart? No - copy requested No - copy requested No - copy requested - Yes No - copy requested No - copy requested  Would patient like  information on creating a medical advance directive? - - - No - Patient declined No - Patient declined - -    Current Medications (verified) Outpatient Encounter Medications as of 02/12/2021  Medication Sig   Cholecalciferol (VITAMIN D3) 5000 UNITS CAPS Take 5,000 Units by mouth every other day.    conjugated estrogens (PREMARIN) vaginal cream Place 1 Applicatorful vaginally daily. One pea size on introitus   escitalopram (LEXAPRO) 10 MG tablet Take 1 tablet (10 mg total) by mouth daily.   gabapentin (NEURONTIN) 100 MG capsule TAKE 1 CAPSULE AT BEDTIME   pyridoxine (B-6) 100 MG tablet Take 100 mg by mouth daily.   simvastatin (ZOCOR) 20 MG tablet TAKE ONE TABLET BY MOUTH AT BEDTIME   vitamin B-12 (CYANOCOBALAMIN) 1000 MCG tablet Take 1,000 mcg by mouth daily.   [DISCONTINUED] IBU 600 MG tablet Take 600 mg by mouth every 6 (six) hours as needed.   [DISCONTINUED] OVER THE COUNTER MEDICATION 1 capsule 2 (two) times daily.    No facility-administered encounter medications on file as of 02/12/2021.    Allergies (verified) Moxifloxacin hcl in nacl, Prednisone, and Penicillins   History: Past Medical History:  Diagnosis Date   Allergy    Cataract    Hyperlipidemia    Medical history non-contributory    Past Surgical History:  Procedure Laterality Date   ABDOMINAL HYSTERECTOMY  1980   CATARACT EXTRACTION W/PHACO Right 06/24/2017   Procedure: CATARACT EXTRACTION PHACO AND INTRAOCULAR LENS PLACEMENT ([place]);  Surgeon: Birder Robson, MD;  Location: [facility] ORS;  Service: Ophthalmology;  Laterality: Right;  [place] 00:48.0 AP% 16.5 CDE 7.92 Fluid Pack Lot # [id number]  CATARACT EXTRACTION W/PHACO Left 07/14/2017   Procedure: CATARACT EXTRACTION PHACO AND INTRAOCULAR LENS PLACEMENT ([facility]);  Surgeon: Birder Robson, MD;  Location: [facility] ORS;  Service: Ophthalmology;  Laterality: Left;  [place] 00:36.0 AP% 16.9 CDE 6.08 Fluid Pack Lot # [id number]   CESAREAN SECTION  1967   CESAREAN SECTION  1971    CHOLECYSTECTOMY  1972   COLONOSCOPY WITH PROPOFOL N/A 12/24/2017   Procedure: COLONOSCOPY WITH PROPOFOL;  Surgeon: Jonathon Bellows, MD;  [facility] END[facility]COPY;  Service: Gastroenterology;  Laterality: N/A;   EYE SURGERY     retal fistula repair     Dr. Jamal Collin x 2   Family History  Problem Relation Age of Onset   Diabetes Mother    Hypertension Mother    Heart attack Mother    Heart failure Father    Heart disease Father    Diabetes Brother    Hypertension Brother    COPD Brother    Diabetes Sister    Breast cancer Neg Hx    Social History   Socioeconomic History   Marital status: Married    Spouse name: Percell Miller   Number of children: 2   Years of education: some college   Highest education level: 12th grade  Occupational History   Occupation: Retired    Comment: since 04/2008  Tobacco Use   Smoking status: Former    Packs/day: 0.50    Years: 5.00    Pack years: 2.50    Types: Cigarettes    Quit date: 12/07/1988    Years since quitting: 32.2   Smokeless tobacco: Never   Tobacco comments:    smoking cessation materials not required  Vaping Use   Vaping Use: Never used  Substance and Sexual Activity   Alcohol use: No    Alcohol/week: 0.0 standard drinks   Drug use: No   Sexual activity: Not Currently    Partners: Male    Birth control/protection: None    Comment: patient's husband has prostate issues so she cannot with him. has not in 31yrs.  Other Topics Concern   Not on file  Social History Narrative   Mother is 43 yrs old that has dementia, staying with her now      Husband has prostate issues and early dementia, he has been losing his patience and it has been stressful       Patient has 7 deteriorated disc   Social Determinants of Health   Financial Resource Strain: Low Risk    Difficulty of Paying Living Expenses: Not hard at all  Food Insecurity: No Food Insecurity   Worried About Charity fundraiser in the Last Year: Never true   [place] in  the Last Year: Never true  Transportation Needs: No Transportation Needs   Lack of Transportation (Medical): No   Lack of Transportation (Non-Medical): No  Physical Activity: Inactive   Days of Exercise per Week: 0 days   Minutes of Exercise per Session: 0 min  Stress: No Stress Concern Present   Feeling of Stress : Not at all  Social Connections: Moderately Integrated   Frequency of Communication with Friends and Family: More than three times a week   Frequency of Social Gatherings with Friends and Family: Once a week   Attends Religious Services: More than 4 times per year   Active Member of [employer] or Organizations: No   Attends Archivist Meetings: Never   Marital Status: Married    Tobacco Counseling Counseling given: Not  Answered Tobacco comments: smoking cessation materials not required   Clinical Intake:  Pre-visit preparation completed: Yes  Pain : No/denies pain     Nutritional Risks: None Diabetes: No  How often do you need to have someone help you when you read instructions, pamphlets, or other written materials from your doctor or pharmacy?: 1 - Never    Interpreter Needed?: No  Information entered by :: Clemetine Marker LPN   Activities of Daily Living In your present state of health, do you have any difficulty performing the following activities: 02/12/2021 12/18/2020  Hearing? N N  Vision? N N  Difficulty concentrating or making decisions? N N  Walking or climbing stairs? N N  Dressing or bathing? N N  Doing errands, shopping? N N  Preparing Food and eating ? N -  Using the Toilet? N -  In the past six months, have you accidently leaked urine? N -  Do you have problems with loss of bowel control? N -  Managing your Medications? N -  Managing your Finances? N -  Housekeeping or managing your Housekeeping? N -  Some recent data might be hidden    Patient Care Team: Steele Sizer, MD as PCP - General (Family Medicine) Birder Robson, MD  as Consulting Physician (Ophthalmology) Ralene Bathe, MD as Consulting Physician (Dermatology) Cathi Roan, [employer] (Inactive) (Pharmacist) [place], [place], [place] as Social Worker  Indicate any recent Medical Services you may have received from other than [facility] providers in the past year (date may be approximate).     Assessment:   This is a routine wellness examination for [employer].  Hearing/Vision screen Hearing Screening - Comments:: Pt denies hearing difficulty Vision Screening - Comments:: Annual vision screenings done at [facility] Dr. Michelene Heady  Dietary issues and exercise activities discussed: Current Exercise Habits: The patient does not participate in regular exercise at present, Exercise limited by: None identified   Goals Addressed             This Visit's Progress    DIET - INCREASE WATER INTAKE   Not on track    Recommend to drink at least 6-8 8oz glasses of water per day.       Depression Screen PHQ 2/9 Scores 02/12/2021 12/18/2020 10/19/2020 10/10/2020 09/26/2020 09/05/2020 02/09/2020  PHQ - 2 Score 0 0 0 6 0 0 0  PHQ- 9 Score 0 0 0 6 0 0 -    Fall Risk Fall Risk  02/12/2021 12/18/2020 10/10/2020 09/26/2020 09/05/2020  Falls in the past year? 0 0 0 0 0  Number falls in past yr: 0 0 0 0 0  Injury with Fall? 0 0 0 0 0  Risk for fall due to : No Fall Risks No Fall Risks - - -  Risk for fall due to: Comment - - - - -  Follow up Falls prevention discussed Falls prevention discussed - Falls prevention discussed Falls prevention discussed    FALL RISK PREVENTION PERTAINING TO THE HOME:  Any stairs in or around the home? Yes  If so, are there any without handrails? No  Home free of loose throw rugs in walkways, pet beds, electrical cords, etc? Yes  Adequate lighting in your home to reduce risk of falls? Yes   ASSISTIVE DEVICES UTILIZED TO PREVENT FALLS:  Life alert? No  Use of a cane, [place] or w/c? No  Grab bars in the bathroom? Yes Shower chair or  bench in shower? Yes  Elevated toilet seat  or a handicapped toilet? Yes   TIMED UP AND GO:  Was the test performed? No . Telephonic visit  Cognitive Function: Normal cognitive status assessed by direct observation by this Nurse Health Advisor. No abnormalities found.       6CIT Screen 12/07/2018 12/08/2017  What Year? 0 points 0 points  What month? 0 points 0 points  What time? 0 points 0 points  Count back from 20 0 points 0 points  Months in reverse 0 points 0 points  Repeat phrase 0 points 0 points  Total Score 0 0    Immunizations There is no immunization history for the selected administration types on file for this patient.  TDAP status: Due, Education has been provided regarding the importance of this vaccine. Advised may receive this vaccine at local pharmacy or Health Dept. Aware to provide a copy of the vaccination record if obtained from local pharmacy or Health Dept. Verbalized acceptance and understanding.  Flu Vaccine status: Declined, Education has been provided regarding the importance of this vaccine but patient still declined. Advised may receive this vaccine at local pharmacy or Health Dept. Aware to provide a copy of the vaccination record if obtained from local pharmacy or Health Dept. Verbalized acceptance and understanding.  Pneumococcal vaccine status: Declined,  Education has been provided regarding the importance of this vaccine but patient still declined. Advised may receive this vaccine at local pharmacy or Health Dept. Aware to provide a copy of the vaccination record if obtained from local pharmacy or Health Dept. Verbalized acceptance and understanding.   Covid-19 vaccine status: Declined, Education has been provided regarding the importance of this vaccine but patient still declined. Advised may receive this vaccine at local pharmacy or Health Dept.or vaccine clinic. Aware to provide a copy of the vaccination record if obtained from local pharmacy or Health  Dept. Verbalized acceptance and understanding.  Qualifies for Shingles Vaccine? Yes   Zostavax completed No   Shingrix Completed?: No.    Education has been provided regarding the importance of this vaccine. Patient has been advised to call insurance company to determine out of pocket expense if they have not yet received this vaccine. Advised may also receive vaccine at local pharmacy or Health Dept. Verbalized acceptance and understanding.  Screening Tests Health Maintenance  Topic Date Due   COVID-19 Vaccine (1) 02/28/2021 (Originally 06/04/1946)   Zoster Vaccines- Shingrix (1 of 2) 05/15/2021 (Originally 12/03/1964)   INFLUENZA VACCINE  07/12/2021 (Originally 11/12/2020)   TETANUS/TDAP  12/18/2021 (Originally 12/03/1964)   Pneumonia Vaccine 53+ Years old (1 - PCV) 02/12/2022 (Originally 12/03/1951)   MAMMOGRAM  01/28/2022   COLONOSCOPY (Pts 45-55yrs Insurance coverage will need to be confirmed)  12/25/2022   DEXA SCAN  Completed   Hepatitis C Screening  Completed   HPV VACCINES  Aged Out    Health Maintenance  There are no preventive care reminders to display for this patient.   Colorectal cancer screening: Type of screening: Colonoscopy. Completed 12/24/17. Repeat every 5 years  Mammogram status: Completed 01/28/21. Repeat every year  Bone Density status: Completed 01/11/18. Results reflect: Bone density results: NORMAL. Repeat every 2 years.  Lung Cancer Screening: (Low Dose CT Chest recommended if Age 83-80 years, 30 pack-year currently smoking OR have quit w/in 15years.) does not qualify.   Additional Screening:  Hepatitis C Screening: does qualify; Completed 11/26/16  Vision Screening: Recommended annual ophthalmology exams for early detection of glaucoma and other disorders of the eye. Is the patient up to date with  their annual eye exam?  Yes  Who is the provider or what is the name of the office in which the patient attends annual eye exams? [facility].   Dental  Screening: Recommended annual dental exams for proper oral hygiene  Community Resource Referral / Chronic Care Management: CRR required this visit?  No   CCM required this visit?  No      Plan:     I have personally reviewed and noted the following in the patient's chart:   Medical and social history Use of alcohol, tobacco or illicit drugs  Current medications and supplements including opioid prescriptions.  Functional ability and status Nutritional status Physical activity Advanced directives List of other physicians Hospitalizations, surgeries, and ER visits in previous 12 months Vitals Screenings to include cognitive, depression, and falls Referrals and appointments  In addition, I have reviewed and discussed with patient certain preventive protocols, quality metrics, and best practice recommendations. A written personalized care plan for preventive services as well as general preventive health recommendations were provided to patient.     Clemetine Marker, LPN   [id number]   Nurse Notes: none

## 2021-02-12 NOTE — Patient Instructions (Signed)
Abigail Hatfield , Thank you for taking time to come for your Medicare Wellness Visit. I appreciate your ongoing commitment to your health goals. Please review the following plan we discussed and let me know if I can assist you in the future.   Screening recommendations/referrals: Colonoscopy: done 12/24/17. Repeat 12/2022 Mammogram: done 01/28/21 Bone Density: done 01/11/18. Please call [phone] to schedule your bone density screening.  Recommended yearly ophthalmology/optometry visit for glaucoma screening and checkup Recommended yearly dental visit for hygiene and checkup  Vaccinations: Influenza vaccine: declined Pneumococcal vaccine: declined Tdap vaccine: due  Shingles vaccine: Shingrix discussed. Please contact your pharmacy for coverage information.  Covid-19: declined  Advanced directives: Please bring a copy of your health care power of attorney and living will to the office at your convenience.   Conditions/risks identified: Recommend increasing physical activity   Next appointment: Follow up in one year for your annual wellness visit    Preventive Care 76 Years and Older, Female Preventive care refers to lifestyle choices and visits with your health care provider that can promote health and wellness. What does preventive care include? A yearly physical exam. This is also called an annual well check. Dental exams once or twice a year. Routine eye exams. Ask your health care provider how often you should have your eyes checked. Personal lifestyle choices, including: Daily care of your teeth and gums. Regular physical activity. Eating a healthy diet. Avoiding tobacco and drug use. Limiting alcohol use. Practicing safe sex. Taking low-dose aspirin every day. Taking vitamin and mineral supplements as recommended by your health care provider. What happens during an annual well check? The services and screenings done by your health care provider during your annual well check  will depend on your age, overall health, lifestyle risk factors, and family history of disease. Counseling  Your health care provider may ask you questions about your: Alcohol use. Tobacco use. Drug use. Emotional well-being. Home and relationship well-being. Sexual activity. Eating habits. History of falls. Memory and ability to understand (cognition). Work and work Statistician. Reproductive health. Screening  You may have the following tests or measurements: Height, weight, and BMI. Blood pressure. Lipid and cholesterol levels. These may be checked every 5 years, or more frequently if you are over 39 years old. Skin check. Lung cancer screening. You may have this screening every year starting at age 76. if you have a 30-pack-year history of smoking and currently smoke or have quit within the past 15 years. Fecal occult blood test (FOBT) of the stool. You may have this test every year starting at age 76. Flexible sigmoidoscopy or colonoscopy. You may have a sigmoidoscopy every 5 years or a colonoscopy every 10 years starting at age 76. Hepatitis C blood test. Hepatitis B blood test. Sexually transmitted disease (STD) testing. Diabetes screening. This is done by checking your blood sugar (glucose) after you have not eaten for a while (fasting). You may have this done every 1-3 years. Bone density scan. This is done to screen for osteoporosis. You may have this done starting at age 76. Mammogram. This may be done every 1-2 years. Talk to your health care provider about how often you should have regular mammograms. Talk with your health care provider about your test results, treatment options, and if necessary, the need for more tests. Vaccines  Your health care provider may recommend certain vaccines, such as: Influenza vaccine. This is recommended every year. Tetanus, diphtheria, and acellular pertussis (Tdap, Td) vaccine. You may need a Td  booster every 10 years. Zoster vaccine. You  may need this after age 76. Pneumococcal 13-valent conjugate (PCV13) vaccine. One dose is recommended after age 76. Pneumococcal polysaccharide (PPSV23) vaccine. One dose is recommended after age 76. Talk to your health care provider about which screenings and vaccines you need and how often you need them. This information is not intended to replace advice given to you by your health care provider. Make sure you discuss any questions you have with your health care provider. Document Released: 04/27/2015 Document Revised: 12/19/2015 Document Reviewed: 01/30/2015 Elsevier Interactive Patient Education  2017 [place] Prevention in the Home Falls can cause injuries. They can happen to people of all ages. There are many things you can do to make your home safe and to help prevent falls. What can I do on the outside of my home? Regularly fix the edges of walkways and driveways and fix any cracks. Remove anything that might make you trip as you walk through a door, such as a raised step or threshold. Trim any bushes or trees on the path to your home. Use bright outdoor lighting. Clear any walking paths of anything that might make someone trip, such as rocks or tools. Regularly check to see if handrails are loose or broken. Make sure that both sides of any steps have handrails. Any raised decks and porches should have guardrails on the edges. Have any leaves, snow, or ice cleared regularly. Use sand or salt on walking paths during winter. Clean up any spills in your garage right away. This includes oil or grease spills. What can I do in the bathroom? Use night lights. Install grab bars by the toilet and in the tub and shower. Do not use towel bars as grab bars. Use non-skid mats or decals in the tub or shower. If you need to sit down in the shower, use a plastic, non-slip stool. Keep the floor dry. Clean up any water that spills on the floor as soon as it happens. Remove soap buildup  in the tub or shower regularly. Attach bath mats securely with double-sided non-slip rug tape. Do not have throw rugs and other things on the floor that can make you trip. What can I do in the bedroom? Use night lights. Make sure that you have a light by your bed that is easy to reach. Do not use any sheets or blankets that are too big for your bed. They should not hang down onto the floor. Have a firm chair that has side arms. You can use this for support while you get dressed. Do not have throw rugs and other things on the floor that can make you trip. What can I do in the kitchen? Clean up any spills right away. Avoid walking on wet floors. Keep items that you use a lot in easy-to-reach places. If you need to reach something above you, use a strong step stool that has a grab bar. Keep electrical cords out of the way. Do not use floor polish or wax that makes floors slippery. If you must use wax, use non-skid floor wax. Do not have throw rugs and other things on the floor that can make you trip. What can I do with my stairs? Do not leave any items on the stairs. Make sure that there are handrails on both sides of the stairs and use them. Fix handrails that are broken or loose. Make sure that handrails are as long as the stairways. Check any carpeting  to make sure that it is firmly attached to the stairs. Fix any carpet that is loose or worn. Avoid having throw rugs at the top or bottom of the stairs. If you do have throw rugs, attach them to the floor with carpet tape. Make sure that you have a light switch at the top of the stairs and the bottom of the stairs. If you do not have them, ask someone to add them for you. What else can I do to help prevent falls? Wear shoes that: Do not have high heels. Have rubber bottoms. Are comfortable and fit you well. Are closed at the toe. Do not wear sandals. If you use a stepladder: Make sure that it is fully opened. Do not climb a closed  stepladder. Make sure that both sides of the stepladder are locked into place. Ask someone to hold it for you, if possible. Clearly mark and make sure that you can see: Any grab bars or handrails. First and last steps. Where the edge of each step is. Use tools that help you move around (mobility aids) if they are needed. These include: Canes. Walkers. Scooters. Crutches. Turn on the lights when you go into a dark area. Replace any light bulbs as soon as they burn out. Set up your furniture so you have a clear path. Avoid moving your furniture around. If any of your floors are uneven, fix them. If there are any pets around you, be aware of where they are. Review your medicines with your doctor. Some medicines can make you feel dizzy. This can increase your chance of falling. Ask your doctor what other things that you can do to help prevent falls. This information is not intended to replace advice given to you by your health care provider. Make sure you discuss any questions you have with your health care provider. Document Released: 01/25/2009 Document Revised: 09/06/2015 Document Reviewed: 05/05/2014 Elsevier Interactive Patient Education  2017 [employer].

## 2021-02-13 ENCOUNTER — Ambulatory Visit: Payer: PPO | Admitting: Dermatology

## 2021-02-13 DIAGNOSIS — M5033 Other cervical disc degeneration, cervicothoracic region: Secondary | ICD-10-CM | POA: Diagnosis not present

## 2021-02-13 DIAGNOSIS — M9903 Segmental and somatic dysfunction of lumbar region: Secondary | ICD-10-CM | POA: Diagnosis not present

## 2021-02-13 DIAGNOSIS — M62830 Muscle spasm of back: Secondary | ICD-10-CM | POA: Diagnosis not present

## 2021-02-13 DIAGNOSIS — M9901 Segmental and somatic dysfunction of cervical region: Secondary | ICD-10-CM | POA: Diagnosis not present

## 2021-03-04 ENCOUNTER — Ambulatory Visit (INDEPENDENT_AMBULATORY_CARE_PROVIDER_SITE_OTHER): Payer: PPO | Admitting: *Deleted

## 2021-03-04 DIAGNOSIS — R4189 Other symptoms and signs involving cognitive functions and awareness: Secondary | ICD-10-CM

## 2021-03-04 DIAGNOSIS — Z638 Other specified problems related to primary support group: Secondary | ICD-10-CM

## 2021-03-04 DIAGNOSIS — F341 Dysthymic disorder: Secondary | ICD-10-CM

## 2021-03-04 NOTE — Patient Instructions (Addendum)
Visit Information  Thank you for taking time to visit with me today. Please don't hesitate to contact me if I can be of assistance to you before our next scheduled telephone appointment.  Following are the goals we discussed today:   - begin personal counseling if needed - call and visit an old friend - practice setting personal boundaries - practice relaxation or meditation daily - talk about feelings with a friend, family or spiritual advisor - practice positive thinking and self-talk   Our next appointment is by telephone on 03/18/21 at 10am  Please call the care guide team at [phone] if you need to cancel or reschedule your appointment.   Please call 911 call the Suicide and Crisis Lifeline: 988 if you are experiencing a Mental Health or [place] or need someone to talk to.   Following is a copy of your full plan of care:  Care Plan : Depression (Adult)  Updates made by Abigail Claude, LCSW since 03/04/2021 12:00 AM     Problem: Symptoms (Depression)      Goal: Symptoms Monitored and Managed   Start Date: 10/19/2020  Expected End Date: 05/22/2021  This Visit's Progress: On track  Recent Progress: On track  Priority: Medium  Note:   Current Barriers:  Chronic Mental Health needs related to caregiver stress, family/relationship dysfunction Suicidal Ideation/Homicidal Ideation: No   Clinical Social Work Goal(s):  Over the next 90 days, patient will work with SW bi-weekly by telephone or in person to reduce or manage symptoms related to depression  Interventions: SDOH Interventions    Flowsheet Row Most Recent Value  SDOH Interventions   Depression Interventions/Treatment  Medication, Counseling     Followed up with patient related to caregiver stress- confirmed move to her mother's home to provide full time care-move reportedly going well Confirmed that  family issues are beginning to resolve, strong boundaries and  positive coping/self care  strategies continue to be reinforced Patient continues to remain compliant with her medications, per patient tolerating the increase in dosage well Verbalization of feelings encouraged/ Active listening / Reflection utilized  Emotional support provided   lf Care Activities:  Performs ADL's independently Performs IADL's independently Ability for insight  Patient Coping Strengths:  Supportive Relationships Spirituality Able to Communicate Effectively  Patient Self Care Deficits:  Self care limited due to caregiver responsibilities  Please see past updates related to this goal by clicking on the "Past Updates" button in the selected goal        Abigail Hatfield was given information about Care Management services by the embedded care coordination team including:  Care Management services include personalized support from designated clinical staff supervised by her physician, including individualized plan of care and coordination with other care providers 24/7 contact phone numbers for assistance for urgent and routine care needs. The patient may stop CCM services at any time (effective at the end of the month) by phone call to the office staff.  Patient agreed to services and verbal consent obtained.   The patient verbalized understanding of instructions, educational materials, and care plan provided today and declined offer to receive copy of patient instructions, educational materials, and care plan.   Telephone follow up appointment with care management team member scheduled for: 03/18/21  Abigail Hatfield, [place] Worker  [facility] [phone]

## 2021-03-04 NOTE — Chronic Care Management (AMB) (Signed)
Chronic Care Management    Clinical Social Work Note  03/04/2021 Name: Abigail Hatfield MRN: [id number] DOB: January 06, 1946  Abigail Hatfield is a 75 y.o. year old female who is a primary care patient of Steele Sizer, MD. The CCM team was consulted to assist the patient with chronic disease management and/or care coordination needs related to: [place] and Resources.   Engaged with patient by telephone for follow up visit in response to provider referral for social work chronic care management and care coordination services.   Consent to Services:  The patient was given information about Chronic Care Management services, agreed to services, and gave verbal consent prior to initiation of services.  Please see initial visit note for detailed documentation.   Patient agreed to services and consent obtained.   Assessment: Review of patient past medical history, allergies, medications, and health status, including review of relevant consultants reports was performed today as part of a comprehensive evaluation and provision of chronic care management and care coordination services.     SDOH (Social Determinants of Health) assessments and interventions performed:    Advanced Directives Status: Not addressed in this encounter.  CCM Care Plan  Allergies  Allergen Reactions   Moxifloxacin Hcl In Nacl Swelling   Prednisone Other (See Comments)    Paranoid   Penicillins Rash and Other (See Comments)    Has patient had a PCN reaction causing immediate rash, facial/tongue/throat swelling, SOB or lightheadedness with hypotension: Unknown Has patient had a PCN reaction causing severe rash involving mucus membranes or skin necrosis: No Has patient had a PCN reaction that required hospitalization: Yes Has patient had a PCN reaction occurring within the last 10 years: No If all of the above answers are "NO", then may proceed with Cephalosporin use.     Outpatient Encounter  Medications as of 03/04/2021  Medication Sig Note   Cholecalciferol (VITAMIN D3) 5000 UNITS CAPS Take 5,000 Units by mouth every other day.     conjugated estrogens (PREMARIN) vaginal cream Place 1 Applicatorful vaginally daily. One pea size on introitus 02/12/2021: PRN   escitalopram (LEXAPRO) 10 MG tablet Take 1 tablet (10 mg total) by mouth daily. 02/12/2021: Pt taking 1.5 tablets daily   gabapentin (NEURONTIN) 100 MG capsule TAKE 1 CAPSULE AT BEDTIME    pyridoxine (B-6) 100 MG tablet Take 100 mg by mouth daily.    simvastatin (ZOCOR) 20 MG tablet TAKE ONE TABLET BY MOUTH AT BEDTIME    vitamin B-12 (CYANOCOBALAMIN) 1000 MCG tablet Take 1,000 mcg by mouth daily.    No facility-administered encounter medications on file as of 03/04/2021.    Patient Active Problem List   Diagnosis Date Noted   Dysthymia 09/05/2020   Hyperglycemia 09/05/2020   Low serum vitamin B12 09/05/2020   Chronic bilateral low back pain without sciatica 09/05/2020   Obesity (BMI 30.0-34.9) 12/07/2017   Degenerative joint disease of low back 12/07/2017   Senile purpura ([place]) 12/07/2017   Hyperlipidemia 12/21/2014    Conditions to be addressed/monitored: Depression; Mental Health Concerns   Care Plan : Depression (Adult)  Updates made by Vern Claude, LCSW since 03/04/2021 12:00 AM     Problem: Symptoms (Depression)      Goal: Symptoms Monitored and Managed   Start Date: 10/19/2020  Expected End Date: 05/22/2021  This Visit's Progress: On track  Recent Progress: On track  Priority: Medium  Note:   Current Barriers:  Chronic Mental Health needs related to caregiver stress, family/relationship dysfunction Suicidal  Ideation/Homicidal Ideation: No   Clinical Social Work Goal(s):  Over the next 90 days, patient will work with SW bi-weekly by telephone or in person to reduce or manage symptoms related to depression  Interventions: SDOH Interventions    Flowsheet Row Most Recent Value  SDOH  Interventions   Depression Interventions/Treatment  Medication, Counseling     Followed up with patient related to caregiver stress- confirmed move to her mother's home to provide full time care-move reportedly going well Confirmed that  family issues are beginning to resolve, strong boundaries and  positive coping/self care strategies continue to be reinforced Patient continues to remain compliant with her medications, per patient tolerating the increase in dosage well Verbalization of feelings encouraged/ Active listening / Reflection utilized  Emotional support provided   lf Care Activities:  Performs ADL's independently Performs IADL's independently Ability for insight  Patient Coping Strengths:  Supportive Relationships Spirituality Able to Communicate Effectively  Patient Self Care Deficits:  Self care limited due to caregiver responsibilities  Please see past updates related to this goal by clicking on the "Past Updates" button in the selected goal         Follow Up Plan: SW will follow up with patient by phone over the next 14 business days      [place], [place] Worker  [facility] [phone]

## 2021-03-06 DIAGNOSIS — M5033 Other cervical disc degeneration, cervicothoracic region: Secondary | ICD-10-CM | POA: Diagnosis not present

## 2021-03-06 DIAGNOSIS — M9903 Segmental and somatic dysfunction of lumbar region: Secondary | ICD-10-CM | POA: Diagnosis not present

## 2021-03-06 DIAGNOSIS — M9901 Segmental and somatic dysfunction of cervical region: Secondary | ICD-10-CM | POA: Diagnosis not present

## 2021-03-06 DIAGNOSIS — M62830 Muscle spasm of back: Secondary | ICD-10-CM | POA: Diagnosis not present

## 2021-03-13 DIAGNOSIS — F341 Dysthymic disorder: Secondary | ICD-10-CM

## 2021-03-18 ENCOUNTER — Ambulatory Visit (INDEPENDENT_AMBULATORY_CARE_PROVIDER_SITE_OTHER): Payer: PPO | Admitting: *Deleted

## 2021-03-18 DIAGNOSIS — Z638 Other specified problems related to primary support group: Secondary | ICD-10-CM

## 2021-03-18 DIAGNOSIS — R4189 Other symptoms and signs involving cognitive functions and awareness: Secondary | ICD-10-CM

## 2021-03-18 DIAGNOSIS — F341 Dysthymic disorder: Secondary | ICD-10-CM

## 2021-03-18 NOTE — Progress Notes (Signed)
Name: Abigail Hatfield   MRN: [id number]    DOB: [birth year]   Date:03/19/2021       Progress Note  Subjective  Chief Complaint  Follow Up  I connected with  [name]  on 03/19/21 at 10:20 AM EST by a telephone  application and verified that I am speaking with the correct person using two identifiers.  I discussed the limitations of evaluation and management by telemedicine and the availability of in person appointments. The patient expressed understanding and agreed to proceed with the virtual visit  Staff also discussed with the patient that there may be a patient responsible charge related to this service. Patient Location: at  mother's home  Provider Location: [facility] Additional Individuals present: husband   HPI  Dysthymia: she was very depressed earlier 2022 She was given zoloft and symptoms got worse, crying, unable to make decisions. Daughter was really worried. We changed to lexapro and she is currently taking 15 mg and is doing much better. She is coping much better with stress , she moved in  ( her husband also) with her mother to be able to help her out She was also grieving the loss of her brother that died last year but feeling much better now  No recent episodes of crying spells. More assertive and feels like her normal self. No side effects of medication and would like to continue taking medication  Senile purpura: she has it on both arms , reassurance given to the patient again   Hyperlipidemia: she is taking simvastatin and last LDL is down to 90, no side effects of medication.    Hyperglycemia: on labs done at [facility], denies polyphagia, polydipsia or polyuria. Last A1C was normal at 5.5 % , we will not recheck it at this time    Chronic back pain: she sees a chiropractor, has daily pain, from cervical spine to lumbar spine, uses tens units to control symptoms, no radiculitis at this time  She has been off gabapentin but still doing well.    B12 deficiency: continue  supplementation, denies fatigue, unchanged     Patient Active Problem List   Diagnosis Date Noted   Dysthymia 09/05/2020   Hyperglycemia 09/05/2020   Low serum vitamin B12 09/05/2020   Chronic bilateral low back pain without sciatica 09/05/2020   Obesity (BMI 30.0-34.9) 12/07/2017   Degenerative joint disease of low back 12/07/2017   Senile purpura ([place]) 12/07/2017   Hyperlipidemia 12/21/2014    Past Surgical History:  Procedure Laterality Date   ABDOMINAL HYSTERECTOMY  1980   CATARACT EXTRACTION W/PHACO Right 06/24/2017   Procedure: CATARACT EXTRACTION PHACO AND INTRAOCULAR LENS PLACEMENT ([place]);  Surgeon: Birder Robson, MD;  Location: [facility] ORS;  Service: Ophthalmology;  Laterality: Right;  [place] 00:48.0 AP% 16.5 CDE 7.92 Fluid Pack Lot # [id number]   CATARACT EXTRACTION W/PHACO Left 07/14/2017   Procedure: CATARACT EXTRACTION PHACO AND INTRAOCULAR LENS PLACEMENT ([facility]);  Surgeon: Birder Robson, MD;  Location: [facility] ORS;  Service: Ophthalmology;  Laterality: Left;  [place] 00:36.0 AP% 16.9 CDE 6.08 Fluid Pack Lot # [id number]   CESAREAN SECTION  1967   CESAREAN SECTION  1971   CHOLECYSTECTOMY  1972   COLONOSCOPY WITH PROPOFOL N/A 12/24/2017   Procedure: COLONOSCOPY WITH PROPOFOL;  Surgeon: Jonathon Bellows, MD;  Location: [facility] ENDOSCOPY;  Service: Gastroenterology;  Laterality: N/A;   EYE SURGERY     retal fistula repair     Dr. Jamal Collin x 2    Family History  Problem  Relation Age of Onset   Diabetes Mother    Hypertension Mother    Heart attack Mother    Heart failure Father    Heart disease Father    Diabetes Brother    Hypertension Brother    COPD Brother    Diabetes Sister    Breast cancer Neg Hx     Social History   Socioeconomic History   Marital status: Married    Spouse name: Percell Miller   Number of children: 2   Years of education: some college   Highest education level: 12th grade  Occupational History   Occupation: Retired    Comment: since 04/2008  Tobacco Use    Smoking status: Former    Packs/day: 0.50    Years: 5.00    Pack years: 2.50    Types: Cigarettes    Quit date: 12/07/1988    Years since quitting: 32.3   Smokeless tobacco: Never   Tobacco comments:    smoking cessation materials not required  Vaping Use   Vaping Use: Never used  Substance and Sexual Activity   Alcohol use: No    Alcohol/week: 0.0 standard drinks   Drug use: No   Sexual activity: Not Currently    Partners: Male    Birth control/protection: None    Comment: patient's husband has prostate issues so she cannot with him. has not in 34yrs.  Other Topics Concern   Not on file  Social History Narrative   Mother is 23 yrs old that has dementia, staying with her now      Husband has prostate issues and early dementia, he has been losing his patience and it has been stressful       Patient has 7 deteriorated disc   Social Determinants of Health   Financial Resource Strain: Low Risk    Difficulty of Paying Living Expenses: Not hard at all  Food Insecurity: No Food Insecurity   Worried About Charity fundraiser in the Last Year: Never true   [place] in the Last Year: Never true  Transportation Needs: No Transportation Needs   Lack of Transportation (Medical): No   Lack of Transportation (Non-Medical): No  Physical Activity: Inactive   Days of Exercise per Week: 0 days   Minutes of Exercise per Session: 0 min  Stress: No Stress Concern Present   Feeling of Stress : Not at all  Social Connections: Moderately Integrated   Frequency of Communication with Friends and Family: More than three times a week   Frequency of Social Gatherings with Friends and Family: Once a week   Attends Religious Services: More than 4 times per year   Active Member of [employer] or Organizations: No   Attends Music therapist: Never   Marital Status: Married  Human resources officer Violence: Not At Risk   Fear of Current or Ex-Partner: No   Emotionally Abused: No    Physically Abused: No   Sexually Abused: No     Current Outpatient Medications:    Cholecalciferol (VITAMIN D3) 5000 UNITS CAPS, Take 5,000 Units by mouth every other day. , Disp: , Rfl:    conjugated estrogens (PREMARIN) vaginal cream, Place 1 Applicatorful vaginally daily. One pea size on introitus, Disp: 42.5 g, Rfl: 12   pyridoxine (B-6) 100 MG tablet, Take 100 mg by mouth daily., Disp: , Rfl:    vitamin B-12 (CYANOCOBALAMIN) 1000 MCG tablet, Take 1,000 mcg by mouth daily., Disp: , Rfl:    escitalopram (LEXAPRO)  10 MG tablet, Take 1.5 tablets (15 mg total) by mouth daily., Disp: 135 tablet, Rfl: 1   simvastatin (ZOCOR) 20 MG tablet, Take 1 tablet (20 mg total) by mouth at bedtime., Disp: 90 tablet, Rfl: 1  Allergies  Allergen Reactions   Moxifloxacin Hcl In Nacl Swelling   Prednisone Other (See Comments)    Paranoid   Penicillins Rash and Other (See Comments)    Has patient had a PCN reaction causing immediate rash, facial/tongue/throat swelling, SOB or lightheadedness with hypotension: Unknown Has patient had a PCN reaction causing severe rash involving mucus membranes or skin necrosis: No Has patient had a PCN reaction that required hospitalization: Yes Has patient had a PCN reaction occurring within the last 10 years: No If all of the above answers are "NO", then may proceed with Cephalosporin use.     I personally reviewed active problem list, medication list, allergies, family history, social history, health maintenance with the patient/caregiver today.   ROS  Ten systems reviewed and is negative except as mentioned in HPI   Objective  Virtual encounter, vitals not obtained.  There is no height or weight on file to calculate BMI.  Physical Exam  Awake, alert and oriented   PHQ2/9: Depression screen [facility] 2/9 03/19/2021 02/12/2021 12/18/2020 10/19/2020 10/10/2020  Decreased Interest 0 0 0 0 3  Down, Depressed, Hopeless 0 0 0 0 3  PHQ - 2 Score 0 0 0 0 6  Altered  sleeping 0 0 0 0 0  Tired, decreased energy 0 0 0 0 0  Change in appetite 0 0 0 0 0  Feeling bad or failure about yourself  0 0 0 0 0  Trouble concentrating 0 0 0 0 0  Moving slowly or fidgety/restless 0 0 0 0 0  Suicidal thoughts 0 0 0 0 0  PHQ-9 Score 0 0 0 0 6  Difficult doing work/chores Not difficult at all Not difficult at all Not difficult at all - Very difficult  Some recent data might be hidden   PHQ-2/9 Result is negative.    Fall Risk: Fall Risk  03/19/2021 02/12/2021 12/18/2020 10/10/2020 09/26/2020  Falls in the past year? 0 0 0 0 0  Number falls in past yr: 0 0 0 0 0  Injury with Fall? 0 0 0 0 0  Risk for fall due to : No Fall Risks No Fall Risks No Fall Risks - -  Risk for fall due to: Comment - - - - -  Follow up Falls prevention discussed Falls prevention discussed Falls prevention discussed - Falls prevention discussed     Assessment & Plan  1. Dysthymia  - escitalopram (LEXAPRO) 10 MG tablet; Take 1.5 tablets (15 mg total) by mouth daily.  Dispense: 135 tablet; Refill: 1  2. Senile purpura ([place])   3. Low serum vitamin B12   4. DDD (degenerative disc disease), lumbosacral   5. Pure hypercholesterolemia  - simvastatin (ZOCOR) 20 MG tablet; Take 1 tablet (20 mg total) by mouth at bedtime.  Dispense: 90 tablet; Refill: 1   I discussed the assessment and treatment plan with the patient. The patient was provided an opportunity to ask questions and all were answered. The patient agreed with the plan and demonstrated an understanding of the instructions.  The patient was advised to call back or seek an in-person evaluation if the symptoms worsen or if the condition fails to improve as anticipated.  I provided 25  minutes of non-face-to-face time during this encounter.

## 2021-03-18 NOTE — Chronic Care Management (AMB) (Signed)
Chronic Care Management    Clinical Social Work Note  03/18/2021 Name: Abigail Hatfield MRN: [id number] DOB: Aug 13, 1945  Abigail Hatfield is a 75 y.o. year old female who is a primary care patient of Steele Sizer, MD. The CCM team was consulted to assist the patient with chronic disease management and/or care coordination needs related to: [place] and Resources.   Engaged with patient by telephone for follow up visit in response to provider referral for social work chronic care management and care coordination services.   Consent to Services:  The patient was given information about Chronic Care Management services, agreed to services, and gave verbal consent prior to initiation of services.  Please see initial visit note for detailed documentation.   Patient agreed to services and consent obtained.   Assessment: Review of patient past medical history, allergies, medications, and health status, including review of relevant consultants reports was performed today as part of a comprehensive evaluation and provision of chronic care management and care coordination services.     SDOH (Social Determinants of Health) assessments and interventions performed:    Advanced Directives Status: Not addressed in this encounter.  CCM Care Plan  Allergies  Allergen Reactions   Moxifloxacin Hcl In Nacl Swelling   Prednisone Other (See Comments)    Paranoid   Penicillins Rash and Other (See Comments)    Has patient had a PCN reaction causing immediate rash, facial/tongue/throat swelling, SOB or lightheadedness with hypotension: Unknown Has patient had a PCN reaction causing severe rash involving mucus membranes or skin necrosis: No Has patient had a PCN reaction that required hospitalization: Yes Has patient had a PCN reaction occurring within the last 10 years: No If all of the above answers are "NO", then may proceed with Cephalosporin use.     Outpatient Encounter  Medications as of 03/18/2021  Medication Sig Note   Cholecalciferol (VITAMIN D3) 5000 UNITS CAPS Take 5,000 Units by mouth every other day.     conjugated estrogens (PREMARIN) vaginal cream Place 1 Applicatorful vaginally daily. One pea size on introitus 02/12/2021: PRN   escitalopram (LEXAPRO) 10 MG tablet Take 1 tablet (10 mg total) by mouth daily. 02/12/2021: Pt taking 1.5 tablets daily   gabapentin (NEURONTIN) 100 MG capsule TAKE 1 CAPSULE AT BEDTIME    pyridoxine (B-6) 100 MG tablet Take 100 mg by mouth daily.    simvastatin (ZOCOR) 20 MG tablet TAKE ONE TABLET BY MOUTH AT BEDTIME    vitamin B-12 (CYANOCOBALAMIN) 1000 MCG tablet Take 1,000 mcg by mouth daily.    No facility-administered encounter medications on file as of 03/18/2021.    Patient Active Problem List   Diagnosis Date Noted   Dysthymia 09/05/2020   Hyperglycemia 09/05/2020   Low serum vitamin B12 09/05/2020   Chronic bilateral low back pain without sciatica 09/05/2020   Obesity (BMI 30.0-34.9) 12/07/2017   Degenerative joint disease of low back 12/07/2017   Senile purpura ([place]) 12/07/2017   Hyperlipidemia 12/21/2014    Conditions to be addressed/monitored: Depression; Mental Health Concerns   Care Plan : Depression (Adult)  Updates made by Vern Claude, LCSW since 03/18/2021 12:00 AM     Problem: Symptoms (Depression)      Goal: Symptoms Monitored and Managed   Start Date: 10/19/2020  Expected End Date: 05/22/2021  This Visit's Progress: On track  Recent Progress: On track  Priority: Medium  Note:   Current Barriers:  Chronic Mental Health needs related to caregiver stress, family/relationship dysfunction Suicidal  Ideation/Homicidal Ideation: No   Clinical Social Work Goal(s):  Over the next 90 days, patient will work with SW bi-weekly by telephone or in person to reduce or manage symptoms related to depression  Interventions: SDOH Interventions    Flowsheet Row Most Recent Value  SDOH Interventions    Depression Interventions/Treatment  Medication, Counseling     Followed up with patient related to caregiver stress- continues to settle in to her mother's home to provide full time care-move reportedly going well Confirmed that  family issues are beginning to resolve, strong boundaries and  positive coping/self care strategies continue to be reinforced Patient continues to remain compliant with her medications, per patient tolerating the increase in dosage well "I feel much calmer" Verbalization of feelings encouraged/ Active listening / Reflection utilized  Emotional support provided   lf Care Activities:  Performs ADL's independently Performs IADL's independently Ability for insight  Patient Coping Strengths:  Supportive Relationships Spirituality Able to Communicate Effectively  Patient Self Care Deficits:  Self care limited due to caregiver responsibilities  Please see past updates related to this goal by clicking on the "Past Updates" button in the selected goal         Follow Up Plan: SW will follow up with patient by phone over the next 14 business days      [place], [place] Worker  [facility] [phone]

## 2021-03-18 NOTE — Patient Instructions (Signed)
Visit Information  Thank you for taking time to visit with me today. Please don't hesitate to contact me if I can be of assistance to you before our next scheduled telephone appointment.  Following are the goals we discussed today:   - begin personal counseling if needed - call and visit an old friend - practice setting personal boundaries - practice relaxation or meditation daily - talk about feelings with a friend, family or spiritual advisor - practice positive thinking and self-talk   Our next appointment is by telephone on 04/01/21 at [place]  Please call the care guide team at [phone] if you need to cancel or reschedule your appointment.   If you are experiencing a Mental Health or [place] or need someone to talk to, please call the Suicide and Crisis Lifeline: 988   Following is a copy of your full plan of care:  Care Plan : Depression (Adult)  Updates made by Abigail Claude, Abigail Hatfield since 03/18/2021 12:00 AM     Problem: Symptoms (Depression)      Goal: Symptoms Monitored and Managed   Start Date: 10/19/2020  Expected End Date: 05/22/2021  This Visit's Progress: On track  Recent Progress: On track  Priority: Medium  Note:   Current Barriers:  Chronic Mental Health needs related to caregiver stress, family/relationship dysfunction Suicidal Ideation/Homicidal Ideation: No   Clinical Social Work Goal(s):  Over the next 90 days, patient will work with SW bi-weekly by telephone or in person to reduce or manage symptoms related to depression  Interventions: SDOH Interventions    Flowsheet Row Most Recent Value  SDOH Interventions   Depression Interventions/Treatment  Medication, Counseling     Followed up with patient related to caregiver stress- continues to settle in to her mother's home to provide full time care-move reportedly going well Confirmed that  family issues are beginning to resolve, strong boundaries and  positive coping/self care  strategies continue to be reinforced Patient continues to remain compliant with her medications, per patient tolerating the increase in dosage well "I feel much calmer" Verbalization of feelings encouraged/ Active listening / Reflection utilized  Emotional support provided   lf Care Activities:  Performs ADL's independently Performs IADL's independently Ability for insight  Patient Coping Strengths:  Supportive Relationships Spirituality Able to Communicate Effectively  Patient Self Care Deficits:  Self care limited due to caregiver responsibilities  Please see past updates related to this goal by clicking on the "Past Updates" button in the selected goal        Abigail Hatfield was given information about Care Management services by the embedded care coordination team including:  Care Management services include personalized support from designated clinical staff supervised by her physician, including individualized plan of care and coordination with other care providers 24/7 contact phone numbers for assistance for urgent and routine care needs. The patient may stop CCM services at any time (effective at the end of the month) by phone call to the office staff.  Patient agreed to services and verbal consent obtained.   The patient verbalized understanding of instructions, educational materials, and care plan provided today and declined offer to receive copy of patient instructions, educational materials, and care plan.   Telephone follow up appointment with care management team member scheduled for:04/01/21  Abigail Hatfield, [place] Worker  [facility] [phone]

## 2021-03-19 ENCOUNTER — Encounter: Payer: Self-pay | Admitting: Family Medicine

## 2021-03-19 ENCOUNTER — Telehealth (INDEPENDENT_AMBULATORY_CARE_PROVIDER_SITE_OTHER): Payer: PPO | Admitting: Family Medicine

## 2021-03-19 DIAGNOSIS — E538 Deficiency of other specified B group vitamins: Secondary | ICD-10-CM | POA: Diagnosis not present

## 2021-03-19 DIAGNOSIS — E7800 Pure hypercholesterolemia, unspecified: Secondary | ICD-10-CM | POA: Diagnosis not present

## 2021-03-19 DIAGNOSIS — D692 Other nonthrombocytopenic purpura: Secondary | ICD-10-CM

## 2021-03-19 DIAGNOSIS — M5137 Other intervertebral disc degeneration, lumbosacral region: Secondary | ICD-10-CM

## 2021-03-19 DIAGNOSIS — F341 Dysthymic disorder: Secondary | ICD-10-CM

## 2021-03-19 MED ORDER — ESCITALOPRAM OXALATE 10 MG PO TABS: 15.0000 mg | ORAL_TABLET | Freq: Every day | ORAL | 1 refills | Status: DC

## 2021-03-19 MED ORDER — SIMVASTATIN 20 MG PO TABS: 20.0000 mg | ORAL_TABLET | Freq: Every day | ORAL | 1 refills | Status: DC

## 2021-03-27 DIAGNOSIS — M9903 Segmental and somatic dysfunction of lumbar region: Secondary | ICD-10-CM | POA: Diagnosis not present

## 2021-03-27 DIAGNOSIS — M9901 Segmental and somatic dysfunction of cervical region: Secondary | ICD-10-CM | POA: Diagnosis not present

## 2021-03-27 DIAGNOSIS — M62830 Muscle spasm of back: Secondary | ICD-10-CM | POA: Diagnosis not present

## 2021-03-27 DIAGNOSIS — M5033 Other cervical disc degeneration, cervicothoracic region: Secondary | ICD-10-CM | POA: Diagnosis not present

## 2021-04-01 ENCOUNTER — Ambulatory Visit: Payer: PPO | Admitting: *Deleted

## 2021-04-01 ENCOUNTER — Other Ambulatory Visit: Payer: Self-pay

## 2021-04-01 ENCOUNTER — Ambulatory Visit
Admission: RE | Admit: 2021-04-01 | Discharge: 2021-04-01 | Disposition: A | Discharge status: Home / Self Care | Payer: PPO | Source: Ambulatory Visit | Attending: Family Medicine | Admitting: Family Medicine

## 2021-04-01 DIAGNOSIS — R4189 Other symptoms and signs involving cognitive functions and awareness: Secondary | ICD-10-CM

## 2021-04-01 DIAGNOSIS — Z780 Asymptomatic menopausal state: Secondary | ICD-10-CM | POA: Diagnosis not present

## 2021-04-01 DIAGNOSIS — Z638 Other specified problems related to primary support group: Secondary | ICD-10-CM

## 2021-04-01 DIAGNOSIS — F341 Dysthymic disorder: Secondary | ICD-10-CM

## 2021-04-01 NOTE — Chronic Care Management (AMB) (Signed)
Chronic Care Management    Clinical Social Work Note  04/01/2021 Name: WENDE LONGSTRETH MRN: [id number] DOB: [birth year]  [name] is a 75 y.o. year old female who is a primary care patient of Steele Sizer, MD. The CCM team was consulted to assist the patient with chronic disease management and/or care coordination needs related to: [place] and Resources.   Engaged with patient by telephone for follow up visit in response to provider referral for social work chronic care management and care coordination services.   Consent to Services:  The patient was given information about Chronic Care Management services, agreed to services, and gave verbal consent prior to initiation of services.  Please see initial visit note for detailed documentation.   Patient agreed to services and consent obtained.   Assessment: Review of patient past medical history, allergies, medications, and health status, including review of relevant consultants reports was performed today as part of a comprehensive evaluation and provision of chronic care management and care coordination services.     SDOH (Social Determinants of Health) assessments and interventions performed:    Advanced Directives Status: Not addressed in this encounter.  CCM Care Plan  Allergies  Allergen Reactions   Moxifloxacin Hcl In Nacl Swelling   Prednisone Other (See Comments)    Paranoid   Penicillins Rash and Other (See Comments)    Has patient had a PCN reaction causing immediate rash, facial/tongue/throat swelling, SOB or lightheadedness with hypotension: Unknown Has patient had a PCN reaction causing severe rash involving mucus membranes or skin necrosis: No Has patient had a PCN reaction that required hospitalization: Yes Has patient had a PCN reaction occurring within the last 10 years: No If all of the above answers are "NO", then may proceed with Cephalosporin use.     Outpatient Encounter  Medications as of 04/01/2021  Medication Sig Note   Cholecalciferol (VITAMIN D3) 5000 UNITS CAPS Take 5,000 Units by mouth every other day.     conjugated estrogens (PREMARIN) vaginal cream Place 1 Applicatorful vaginally daily. One pea size on introitus 02/12/2021: PRN   escitalopram (LEXAPRO) 10 MG tablet Take 1.5 tablets (15 mg total) by mouth daily.    pyridoxine (B-6) 100 MG tablet Take 100 mg by mouth daily.    simvastatin (ZOCOR) 20 MG tablet Take 1 tablet (20 mg total) by mouth at bedtime.    vitamin B-12 (CYANOCOBALAMIN) 1000 MCG tablet Take 1,000 mcg by mouth daily.    No facility-administered encounter medications on file as of 04/01/2021.    Patient Active Problem List   Diagnosis Date Noted   Dysthymia 09/05/2020   Hyperglycemia 09/05/2020   Low serum vitamin B12 09/05/2020   Chronic bilateral low back pain without sciatica 09/05/2020   Obesity (BMI 30.0-34.9) 12/07/2017   Degenerative joint disease of low back 12/07/2017   Senile purpura ([place]) 12/07/2017   Hyperlipidemia 12/21/2014    Conditions to be addressed/monitored: Depression; Mental Health Concerns   Care Plan : Depression (Adult)  Updates made by Vern Claude, LCSW since 04/01/2021 12:00 AM     Problem: Symptoms (Depression)      Goal: Symptoms Monitored and Managed   Start Date: 10/19/2020  Expected End Date: 05/22/2021  This Visit's Progress: On track  Recent Progress: On track  Priority: Medium  Note:   Current Barriers:  Chronic Mental Health needs related to caregiver stress, family/relationship dysfunction Suicidal Ideation/Homicidal Ideation: No   Clinical Social Work Goal(s):  Over the next 90 days,  patient will work with SW bi-weekly by telephone or in person to reduce or manage symptoms related to depression  Interventions: SDOH Interventions    Flowsheet Row Most Recent Value  SDOH Interventions   Depression Interventions/Treatment  Medication, Counseling     Followed up with  patient related to caregiver stress-care giving responsibilities have improved with the assistance of her daughter Confirmed that  family issues are beginning to resolve, strong boundaries with family and  positive coping/self care strategies continue to be reinforced Patient continues to remain compliant with her medications, mood has improved evidenced by decreased agitation Verbalization of feelings encouraged/ Active listening / Reflection utilized  Emotional support provided   lf Care Activities:  Performs ADL's independently Performs IADL's independently Ability for insight  Patient Coping Strengths:  Supportive Relationships Spirituality Able to Communicate Effectively  Patient Self Care Deficits:  Self care limited due to caregiver responsibilities  Please see past updates related to this goal by clicking on the "Past Updates" button in the selected goal         Follow Up Plan: Appointment scheduled for SW follow up with client by phone on:  04/22/21      Elliot Gurney, [place] Worker  [facility] [phone]

## 2021-04-01 NOTE — Patient Instructions (Signed)
Visit Information  Thank you for taking time to visit with me today. Please don't hesitate to contact me if I can be of assistance to you before our next scheduled telephone appointment.  Following are the goals we discussed today:   - practice setting personal boundaries - practice relaxation or meditation daily - talk about feelings with a friend, family or spiritual advisor - practice positive thinking and self-talk   Our next appointment is by telephone on 04/22/21 at 10am  Please call the care guide team at [phone] if you need to cancel or reschedule your appointment.   If you are experiencing a Mental Health or [place] or need someone to talk to, please call the Suicide and Crisis Lifeline: 988   The patient verbalized understanding of instructions, educational materials, and care plan provided today and declined offer to receive copy of patient instructions, educational materials, and care plan.   Telephone follow up appointment with care management team member scheduled for:04/22/21  Elliot Gurney, [place] Worker  [facility] [phone]

## 2021-04-05 NOTE — Progress Notes (Signed)
This encounter was created in error - please disregard.

## 2021-04-13 DIAGNOSIS — F341 Dysthymic disorder: Secondary | ICD-10-CM

## 2021-04-22 ENCOUNTER — Ambulatory Visit (INDEPENDENT_AMBULATORY_CARE_PROVIDER_SITE_OTHER): Payer: No Typology Code available for payment source | Admitting: *Deleted

## 2021-04-22 DIAGNOSIS — R4189 Other symptoms and signs involving cognitive functions and awareness: Secondary | ICD-10-CM

## 2021-04-22 DIAGNOSIS — F341 Dysthymic disorder: Secondary | ICD-10-CM

## 2021-04-22 DIAGNOSIS — Z638 Other specified problems related to primary support group: Secondary | ICD-10-CM

## 2021-04-22 NOTE — Patient Instructions (Signed)
Visit Information  Thank you for taking time to visit with me today. Please don't hesitate to contact me if I can be of assistance to you before our next scheduled telephone appointment.  Following are the goals we discussed today:  practice setting personal boundaries - practice relaxation or meditation daily - talk about feelings with a friend, family or spiritual advisor - practice positive thinking and self-talk      If you are experiencing a Mental Health or [place] or need someone to talk to, please call the Suicide and Crisis Lifeline: 988   The patient verbalized understanding of instructions, educational materials, and care plan provided today and declined offer to receive copy of patient instructions, educational materials, and care plan.   No further follow up required: patient to contact this Education officer, museum with any additional community resource needs  [employer], [place] Worker  [facility] [phone]

## 2021-04-22 NOTE — Chronic Care Management (AMB) (Signed)
Chronic Care Management    Clinical Social Work Note  04/22/2021 Name: Abigail Hatfield MRN: [id number] DOB: 03-Dec-1945  Abigail Hatfield is a 76 y.o. year old female who is a primary care patient of Steele Sizer, MD. The CCM team was consulted to assist the patient with chronic disease management and/or care coordination needs related to: [place] and Resources.   Engaged with patient by telephone for follow up visit in response to provider referral for social work chronic care management and care coordination services.   Consent to Services:  The patient was given information about Chronic Care Management services, agreed to services, and gave verbal consent prior to initiation of services.  Please see initial visit note for detailed documentation.   Patient agreed to services and consent obtained.   Assessment: Review of patient past medical history, allergies, medications, and health status, including review of relevant consultants reports was performed today as part of a comprehensive evaluation and provision of chronic care management and care coordination services.     SDOH (Social Determinants of Health) assessments and interventions performed:    Advanced Directives Status: Not addressed in this encounter.  CCM Care Plan  Allergies  Allergen Reactions   Moxifloxacin Hcl In Nacl Swelling   Prednisone Other (See Comments)    Paranoid   Penicillins Rash and Other (See Comments)    Has patient had a PCN reaction causing immediate rash, facial/tongue/throat swelling, SOB or lightheadedness with hypotension: Unknown Has patient had a PCN reaction causing severe rash involving mucus membranes or skin necrosis: No Has patient had a PCN reaction that required hospitalization: Yes Has patient had a PCN reaction occurring within the last 10 years: No If all of the above answers are "NO", then may proceed with Cephalosporin use.     Outpatient Encounter Medications  as of 04/22/2021  Medication Sig Note   Cholecalciferol (VITAMIN D3) 5000 UNITS CAPS Take 5,000 Units by mouth every other day.     conjugated estrogens (PREMARIN) vaginal cream Place 1 Applicatorful vaginally daily. One pea size on introitus 02/12/2021: PRN   escitalopram (LEXAPRO) 10 MG tablet Take 1.5 tablets (15 mg total) by mouth daily.    pyridoxine (B-6) 100 MG tablet Take 100 mg by mouth daily.    simvastatin (ZOCOR) 20 MG tablet Take 1 tablet (20 mg total) by mouth at bedtime.    vitamin B-12 (CYANOCOBALAMIN) 1000 MCG tablet Take 1,000 mcg by mouth daily.    No facility-administered encounter medications on file as of 04/22/2021.    Patient Active Problem List   Diagnosis Date Noted   Dysthymia 09/05/2020   Hyperglycemia 09/05/2020   Low serum vitamin B12 09/05/2020   Chronic bilateral low back pain without sciatica 09/05/2020   Obesity (BMI 30.0-34.9) 12/07/2017   Degenerative joint disease of low back 12/07/2017   Senile purpura ([place]) 12/07/2017   Hyperlipidemia 12/21/2014    Conditions to be addressed/monitored: Depression; Mental Health Concerns   Care Plan : Depression (Adult)  Updates made by Abigail Claude, LCSW since 04/22/2021 12:00 AM     Problem: Symptoms (Depression)      Goal: Symptoms Monitored and Managed   Start Date: 10/19/2020  Expected End Date: 05/22/2021  Recent Progress: On track  Priority: Medium  Note:   Current Barriers:  Chronic Mental Health needs related to caregiver stress, family/relationship dysfunction Suicidal Ideation/Homicidal Ideation: No   Clinical Social Work Goal(s):  Over the next 90 days, patient will work with SW bi-weekly  by telephone or in person to reduce or manage symptoms related to depression  Interventions: SDOH Interventions    Flowsheet Row Most Recent Value  SDOH Interventions   Depression Interventions/Treatment  Medication, Counseling     Followed up with patient related to caregiver stress and depressed mood  care giving responsibilities have improved,  assistance of her daughter helpful as well as medication adherence Confirmed that  family issues are resurfacing however, strong boundaries with family and  positive coping/self care strategies continue to be utilized Patient continues to remain compliant with her medications, mood has improved evidenced by decreased agitation Verbalization of feelings encouraged/ Active listening / Reflection utilized  Emotional support provided, patient encouraged to contact this Education officer, museum with any additional community resource needs   lf Care Activities:  Performs ADL's independently Performs IADL's independently Ability for insight  Patient Coping Strengths:  Supportive Relationships Spirituality Able to Communicate Effectively  Patient Self Care Deficits:  Self care limited due to caregiver responsibilities  Please see past updates related to this goal by clicking on the "Past Updates" button in the selected goal         Follow Up Plan: Client will contact this social worker with any additional mental health needs or concerns       Abigail Hatfield, [place] Worker  [facility] [phone]

## 2021-05-14 DIAGNOSIS — F341 Dysthymic disorder: Secondary | ICD-10-CM

## 2021-06-04 ENCOUNTER — Other Ambulatory Visit: Payer: Self-pay | Admitting: Family Medicine

## 2021-07-08 ENCOUNTER — Ambulatory Visit: Payer: Self-pay
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
## 2021-07-10 NOTE — Progress Notes (Signed)
Name: Abigail Hatfield   MRN: [id number]    DOB: Jun 16, 1945   Date:07/11/2021 ? ?     Progress Note ? ?Subjective ? ?Chief Complaint ? ?Forgetfulness/Depression ? ?HPI ? ?Dysthymia: she was very depressed earlier 2022 She was given zoloft and symptoms got worse, crying, unable to make decisions.  We changed to lexapro and neurologist increased dose to 15 mg September 2022.  She moved in with her husband to take care of her 77 yo mother, she is also carrying for her sister that has cognitive dysfunction. Daughter thinks stress is much higher now Her brother in law recently died. She has been sleeping more than usual, getting defensive  ?  ?Memory loss: daughter came in with her, symptoms of forgetfulness is getting worse. Getting repetitive, withdrawn, got lost going to the hospital and also to the movies. She gave all her information to a hacker ( something she would have never done in the past - change in her personality) . She gets frustrated. Discussed starting medication today . Sometimes her conversations don't make sense - thinking about things from the past.  ?  ?Patient Active Problem List  ? Diagnosis Date Noted  ? Dysthymia 09/05/2020  ? Hyperglycemia 09/05/2020  ? Low serum vitamin B12 09/05/2020  ? Chronic bilateral low back pain without sciatica 09/05/2020  ? Obesity (BMI 30.0-34.9) 12/07/2017  ? Degenerative joint disease of low back 12/07/2017  ? Senile purpura ([place]) 12/07/2017  ? Hyperlipidemia 12/21/2014  ? ? ?Past Surgical History:  ?Procedure Laterality Date  ? ABDOMINAL HYSTERECTOMY  1980  ? CATARACT EXTRACTION W/PHACO Right 06/24/2017  ? Procedure: CATARACT EXTRACTION PHACO AND INTRAOCULAR LENS PLACEMENT (IOC);  Surgeon: Birder Robson, MD;  Location: [facility] ORS;  Service: Ophthalmology;  Laterality: Right;  [place] 00:48.0 ?AP% 16.5 ?CDE 7.92 ?Fluid Pack Lot # [id number]  ? CATARACT EXTRACTION W/PHACO Left 07/14/2017  ? Procedure: CATARACT EXTRACTION PHACO AND INTRAOCULAR LENS PLACEMENT (IOC);  Surgeon:  Birder Robson, MD;  Location: [facility] ORS;  Service: Ophthalmology;  Laterality: Left;  [place] 00:36.0 ?AP% 16.9 ?CDE 6.08 ?Fluid Pack Lot # [id number]  ? [place]  ? [place]  ? CHOLECYSTECTOMY  1972  ? COLONOSCOPY WITH PROPOFOL N/A 12/24/2017  ? Procedure: COLONOSCOPY WITH PROPOFOL;  Surgeon: Jonathon Bellows, MD;  Location: [facility] ENDOSCOPY;  Service: Gastroenterology;  Laterality: N/A;  ? EYE SURGERY    ? retal fistula repair    ? Dr. Jamal Collin x 2  ? ? ?Family History  ?Problem Relation Age of Onset  ? Diabetes Mother   ? Hypertension Mother   ? Heart attack Mother   ? Heart failure Father   ? Heart disease Father   ? Diabetes Brother   ? Hypertension Brother   ? COPD Brother   ? Diabetes Sister   ? Breast cancer Neg Hx   ? ? ?Social History  ? ?Tobacco Use  ? Smoking status: Former  ?  Packs/day: 0.50  ?  Years: 5.00  ?  Pack years: 2.50  ?  Types: Cigarettes  ?  Quit date: 12/07/1988  ?  Years since quitting: 32.6  ? Smokeless tobacco: Never  ? Tobacco comments:  ?  smoking cessation materials not required  ?Substance Use Topics  ? Alcohol use: No  ?  Alcohol/week: 0.0 standard drinks  ? ? ? ?Current Outpatient Medications:  ?  buPROPion (WELLBUTRIN XL) 150 MG 24 hr tablet, Take 1 tablet (150 mg total) by mouth daily., Disp: 30  tablet, Rfl: 1 ?  Cholecalciferol (VITAMIN D3) 5000 UNITS CAPS, Take 5,000 Units by mouth every other day. , Disp: , Rfl:  ?  escitalopram (LEXAPRO) 10 MG tablet, Take 1.5 tablets (15 mg total) by mouth daily., Disp: 135 tablet, Rfl: 1 ?  pyridoxine (B-6) 100 MG tablet, Take 100 mg by mouth daily., Disp: , Rfl:  ?  simvastatin (ZOCOR) 20 MG tablet, Take 1 tablet (20 mg total) by mouth at bedtime., Disp: 90 tablet, Rfl: 1 ?  vitamin B-12 (CYANOCOBALAMIN) 1000 MCG tablet, Take 1,000 mcg by mouth daily., Disp: , Rfl:  ?  conjugated estrogens (PREMARIN) vaginal cream, Place 1 Applicatorful vaginally daily. One pea size on introitus (Patient not taking: Reported on 07/11/2021),  Disp: 42.5 g, Rfl: 12 ?  gabapentin (NEURONTIN) 100 MG capsule, TAKE 1 CAPSULE AT BEDTIME (Patient not taking: Reported on 07/11/2021), Disp: 90 capsule, Rfl: 1 ? ?Allergies  ?Allergen Reactions  ? Moxifloxacin Hcl In Nacl Swelling  ? Prednisone Other (See Comments)  ?  Paranoid  ? Penicillins Rash and Other (See Comments)  ?  Has patient had a PCN reaction causing immediate rash, facial/tongue/throat swelling, SOB or lightheadedness with hypotension: Unknown ?Has patient had a PCN reaction causing severe rash involving mucus membranes or skin necrosis: No ?Has patient had a PCN reaction that required hospitalization: Yes ?Has patient had a PCN reaction occurring within the last 10 years: No ?If all of the above answers are "NO", then may proceed with Cephalosporin use. ?  ? ? ?I personally reviewed active problem list, medication list, allergies, family history, social history, health maintenance with the patient/caregiver today. ? ? ?ROS ? ?Ten systems reviewed and is negative except as mentioned in HPI  ?Daughter has noticed some balanced difficulty ? ?Objective ? ?Vitals:  ? 07/11/21 0859  ?BP: 122/74  ?Pulse: 67  ?Resp: 16  ?SpO2: 94%  ?Weight: 179 lb (81.2 kg)  ?Height: 5' (1.524 m)  ? ? ?Body mass index is 34.96 kg/m?. ? ?Physical Exam ? ?Constitutional: Patient appears well-developed and well-nourished. Obese  No distress.  ?HEENT: head atraumatic, normocephalic, pupils equal and reactive to light, neck supple, throat within normal limits ?Cardiovascular: Normal rate, regular rhythm and normal heart sounds.  No murmur heard. No BLE edema. ?Pulmonary/Chest: Effort normal and breath sounds normal. No respiratory distress. ?Abdominal: Soft.  There is no tenderness. ?Psychiatric: Patient has a normal mood and affect. behavior is normal. Judgment and thought content normal.  ?Neurological: normal cranial nerves, normal strength, romberg negative, normal sensation Normal gait  ? ? ?PHQ2/9: ? ?  07/11/2021  ?  8:46  AM 03/19/2021  ?  9:30 AM 02/12/2021  ? 10:14 AM 12/18/2020  ?  8:36 AM 10/19/2020  ? 11:49 AM  ?Depression screen PHQ 2/9  ?Decreased Interest 2 0 0 0 0  ?Down, Depressed, Hopeless 1 0 0 0 0  ?PHQ - 2 Score 3 0 0 0 0  ?Altered sleeping 1 0 0 0 0  ?Tired, decreased energy 3 0 0 0 0  ?Change in appetite 0 0 0 0 0  ?Feeling bad or failure about yourself  0 0 0 0 0  ?Trouble concentrating 0 0 0 0 0  ?Moving slowly or fidgety/restless 0 0 0 0 0  ?Suicidal thoughts 0 0 0 0 0  ?PHQ-9 Score 7 0 0 0 0  ?Difficult doing work/chores  Not difficult at all Not difficult at all Not difficult at all   ?  ?phq 9 is  positive ? ? ?Fall Risk: ? ?  07/11/2021  ?  8:46 AM 03/19/2021  ?  9:30 AM 02/12/2021  ? 10:16 AM 12/18/2020  ?  8:36 AM 10/10/2020  ?  8:09 AM  ?Fall Risk   ?Falls in the past year? 0 0 0 0 0  ?Number falls in past yr: 0 0 0 0 0  ?Injury with Fall? 0 0 0 0 0  ?Risk for fall due to : No Fall Risks No Fall Risks No Fall Risks No Fall Risks   ?Follow up Falls prevention discussed Falls prevention discussed Falls prevention discussed Falls prevention discussed   ? ? ? ? ?Functional Status Survey: ?Is the patient deaf or have difficulty hearing?: No ?Does the patient have difficulty seeing, even when wearing glasses/contacts?: No ?Does the patient have difficulty concentrating, remembering, or making decisions?: No ?Does the patient have difficulty walking or climbing stairs?: No ?Does the patient have difficulty dressing or bathing?: No ?Does the patient have difficulty doing errands alone such as visiting a doctor's office or shopping?: No ? ? ? ?Assessment & Plan ? ?1. Cognitive impairment ? ?Spoke to patient and her daughter Abigail Hatfield ) and they will follow up with neurologist , Dr. Melrose Nakayama, since it seems to be progressing  ?Advised to discuss balance with neurologist also ? ?2. Dysthymia ? ?- buPROPion (WELLBUTRIN XL) 150 MG 24 hr tablet; Take 1 tablet (150 mg total) by mouth daily.  Dispense: 30 tablet; Refill: 1  ?

## 2021-07-11 ENCOUNTER — Encounter: Payer: Self-pay | Admitting: Family Medicine

## 2021-07-11 ENCOUNTER — Telehealth: Payer: Self-pay | Admitting: Family Medicine

## 2021-07-11 ENCOUNTER — Ambulatory Visit (INDEPENDENT_AMBULATORY_CARE_PROVIDER_SITE_OTHER): Payer: No Typology Code available for payment source | Admitting: Family Medicine

## 2021-07-11 VITALS — BP 122/74 | HR 67 | Resp 16 | Ht 60.0 in | Wt 179.0 lb

## 2021-07-11 DIAGNOSIS — R4189 Other symptoms and signs involving cognitive functions and awareness: Secondary | ICD-10-CM

## 2021-07-11 DIAGNOSIS — F341 Dysthymic disorder: Secondary | ICD-10-CM | POA: Diagnosis not present

## 2021-07-11 MED ORDER — BUPROPION HCL ER (XL) 150 MG PO TB24: 150.0000 mg | ORAL_TABLET | Freq: Every day | ORAL | 1 refills | Status: DC

## 2021-07-11 NOTE — Telephone Encounter (Signed)
Pt called to let Dr. Ancil Boozer know that she still takes Gabapentin '100MG'$  , simvastatin (ZOCOR) 20 MG tablet,  escitalopram (LEXAPRO) 10 MG tablet/ FYI/ she couldn't remember during her visit this morning  ?

## 2021-07-11 NOTE — Patient Instructions (Signed)
Dr. Melrose Nakayama: [phone] ?

## 2021-07-12 ENCOUNTER — Other Ambulatory Visit: Payer: Self-pay | Admitting: Family Medicine

## 2021-07-12 MED ORDER — GABAPENTIN 100 MG PO CAPS: 100.0000 mg | ORAL_CAPSULE | Freq: Every evening | ORAL | 0 refills | Status: DC

## 2021-07-22 DIAGNOSIS — F411 Generalized anxiety disorder: Secondary | ICD-10-CM | POA: Diagnosis not present

## 2021-07-22 DIAGNOSIS — Z8659 Personal history of other mental and behavioral disorders: Secondary | ICD-10-CM | POA: Diagnosis not present

## 2021-07-22 DIAGNOSIS — R413 Other amnesia: Secondary | ICD-10-CM | POA: Diagnosis not present

## 2021-08-19 ENCOUNTER — Other Ambulatory Visit: Payer: Self-pay | Admitting: Family Medicine

## 2021-08-19 DIAGNOSIS — F341 Dysthymic disorder: Secondary | ICD-10-CM

## 2021-09-17 ENCOUNTER — Other Ambulatory Visit: Payer: Self-pay | Admitting: Family Medicine

## 2021-09-17 DIAGNOSIS — F341 Dysthymic disorder: Secondary | ICD-10-CM

## 2021-09-23 ENCOUNTER — Encounter: Payer: Self-pay | Admitting: Family Medicine

## 2021-09-24 ENCOUNTER — Ambulatory Visit (INDEPENDENT_AMBULATORY_CARE_PROVIDER_SITE_OTHER): Payer: PPO | Admitting: Family Medicine

## 2021-09-24 ENCOUNTER — Encounter: Payer: Self-pay | Admitting: Family Medicine

## 2021-09-24 VITALS — BP 126/72 | HR 72 | Resp 16 | Ht 60.0 in | Wt 178.0 lb

## 2021-09-24 DIAGNOSIS — M545 Low back pain, unspecified: Secondary | ICD-10-CM | POA: Diagnosis not present

## 2021-09-24 DIAGNOSIS — M5137 Other intervertebral disc degeneration, lumbosacral region: Secondary | ICD-10-CM

## 2021-09-24 DIAGNOSIS — Z79899 Other long term (current) drug therapy: Secondary | ICD-10-CM | POA: Diagnosis not present

## 2021-09-24 DIAGNOSIS — G8929 Other chronic pain: Secondary | ICD-10-CM | POA: Diagnosis not present

## 2021-09-24 DIAGNOSIS — R413 Other amnesia: Secondary | ICD-10-CM | POA: Diagnosis not present

## 2021-09-24 DIAGNOSIS — E7800 Pure hypercholesterolemia, unspecified: Secondary | ICD-10-CM | POA: Diagnosis not present

## 2021-09-24 DIAGNOSIS — E538 Deficiency of other specified B group vitamins: Secondary | ICD-10-CM

## 2021-09-24 DIAGNOSIS — D692 Other nonthrombocytopenic purpura: Secondary | ICD-10-CM | POA: Diagnosis not present

## 2021-09-24 DIAGNOSIS — R4189 Other symptoms and signs involving cognitive functions and awareness: Secondary | ICD-10-CM | POA: Insufficient documentation

## 2021-09-24 DIAGNOSIS — F341 Dysthymic disorder: Secondary | ICD-10-CM | POA: Diagnosis not present

## 2021-09-24 MED ORDER — ESCITALOPRAM OXALATE 10 MG PO TABS: 15.0000 mg | ORAL_TABLET | Freq: Every day | ORAL | 1 refills | Status: DC

## 2021-09-24 MED ORDER — GABAPENTIN 100 MG PO CAPS: 100.0000 mg | ORAL_CAPSULE | Freq: Every evening | ORAL | 1 refills | Status: DC

## 2021-09-24 MED ORDER — SIMVASTATIN 20 MG PO TABS: 20.0000 mg | ORAL_TABLET | Freq: Every day | ORAL | 1 refills | Status: DC

## 2021-09-24 MED ORDER — BUPROPION HCL ER (XL) 150 MG PO TB24: 150.0000 mg | ORAL_TABLET | Freq: Every day | ORAL | 1 refills | Status: DC

## 2021-09-24 NOTE — Progress Notes (Signed)
Name: Abigail Hatfield   MRN: [id number]    DOB: 06/15/1945   Date:09/24/2021       Progress Note  Subjective  Chief Complaint  Follow Up  HPI  She came in alone today, drove herself  Dysthymia: she was very depressed earlier 2022 She was given zoloft and symptoms got worse, crying, unable to make decisions. Daughter was really worried. We changed to lexapro and she is currently taking 15 mg plus Wellbutrin XL 150 mg . She is coping much better with stress , she moved in  ( her husband also) with her mother to be able to help her out She was also grieving the loss of her brother that died last year but feeling much better now  No longer having episodes of crying spells. More assertive and feels like her normal self. She is seeing Dr. Melrose Nakayama for memory decline, had normal MSS and SLUMS  17/30 and likely secondary to stress, advised to continue taking anti-depressants   Senile purpura: she has it on both arms , reassurance given to the patient again Unchanged   Hyperlipidemia: she is taking simvastatin and last LDL is down to 90, no side effects of medication.    Hyperglycemia: , denies polyphagia, polydipsia or polyuria. Last A1C was normal at 5.5 %   Chronic back pain: she sees a chiropractor, has daily pain, from cervical spine to lumbar spine, uses tens units prn to control symptoms.She thinks she is taking gabapentin at night and we will give a refill, no recent episodes of radiculitis    B12 deficiency: continue supplementation, denies fatigue, or paresthesia    Patient Active Problem List   Diagnosis Date Noted   Dysthymia 09/05/2020   Hyperglycemia 09/05/2020   Low serum vitamin B12 09/05/2020   Chronic bilateral low back pain without sciatica 09/05/2020   Obesity (BMI 30.0-34.9) 12/07/2017   Degenerative joint disease of low back 12/07/2017   Senile purpura ([place]) 12/07/2017   Hyperlipidemia 12/21/2014    Past Surgical History:  Procedure Laterality Date   ABDOMINAL  HYSTERECTOMY  1980   CATARACT EXTRACTION W/PHACO Right 06/24/2017   Procedure: CATARACT EXTRACTION PHACO AND INTRAOCULAR LENS PLACEMENT ([place]);  Surgeon: Birder Robson, MD;  Location: [facility] ORS;  Service: Ophthalmology;  Laterality: Right;  [place] 00:48.0 AP% 16.5 CDE 7.92 Fluid Pack Lot # [id number] H   CATARACT EXTRACTION W/PHACO Left 07/14/2017   Procedure: CATARACT EXTRACTION PHACO AND INTRAOCULAR LENS PLACEMENT (IOC);  Surgeon: Birder Robson, MD;  Location: [facility] ORS;  Service: Ophthalmology;  Laterality: Left;  [place] 00:36.0 AP% 16.9 CDE 6.08 Fluid Pack Lot # [id number]   CESAREAN SECTION  1967   CESAREAN SECTION  1971   CHOLECYSTECTOMY  1972   COLONOSCOPY WITH PROPOFOL N/A 12/24/2017   Procedure: COLONOSCOPY WITH PROPOFOL;  Surgeon: Jonathon Bellows, MD;  Location: [facility] ENDOSCOPY;  Service: Gastroenterology;  Laterality: N/A;   EYE SURGERY     retal fistula repair     Dr. Jamal Collin x 2    Family History  Problem Relation Age of Onset   Diabetes Mother    Hypertension Mother    Heart attack Mother    Heart failure Father    Heart disease Father    Diabetes Brother    Hypertension Brother    COPD Brother    Diabetes Sister    Breast cancer Neg Hx     Social History   Tobacco Use   Smoking status: Former    Packs/day: 0.50    Years: 5.00  Total pack years: 2.50    Types: Cigarettes    Quit date: 12/07/1988    Years since quitting: 32.8   Smokeless tobacco: Never   Tobacco comments:    smoking cessation materials not required  Substance Use Topics   Alcohol use: No    Alcohol/week: 0.0 standard drinks of alcohol     Current Outpatient Medications:    buPROPion (WELLBUTRIN XL) 150 MG 24 hr tablet, TAKE ONE TABLET BY MOUTH EVERY DAY, Disp: 30 tablet, Rfl: 0   Cholecalciferol (VITAMIN D3) 5000 UNITS CAPS, Take 5,000 Units by mouth every other day. , Disp: , Rfl:    escitalopram (LEXAPRO) 10 MG tablet, Take 1.5 tablets (15 mg total) by mouth daily., Disp: 135 tablet, Rfl: 1    gabapentin (NEURONTIN) 100 MG capsule, Take 1 capsule (100 mg total) by mouth every evening., Disp: 90 capsule, Rfl: 0   pyridoxine (B-6) 100 MG tablet, Take 100 mg by mouth daily., Disp: , Rfl:    simvastatin (ZOCOR) 20 MG tablet, Take 1 tablet (20 mg total) by mouth at bedtime., Disp: 90 tablet, Rfl: 1   vitamin B-12 (CYANOCOBALAMIN) 1000 MCG tablet, Take 1,000 mcg by mouth daily., Disp: , Rfl:   Allergies  Allergen Reactions   Moxifloxacin Hcl In Nacl Swelling   Prednisone Other (See Comments)    Paranoid   Penicillins Rash and Other (See Comments)    Has patient had a PCN reaction causing immediate rash, facial/tongue/throat swelling, SOB or lightheadedness with hypotension: Unknown Has patient had a PCN reaction causing severe rash involving mucus membranes or skin necrosis: No Has patient had a PCN reaction that required hospitalization: Yes Has patient had a PCN reaction occurring within the last 10 years: No If all of the above answers are "NO", then may proceed with Cephalosporin use.     I personally reviewed active problem list, medication list, allergies, family history, social history, health maintenance with the patient/caregiver today.   ROS  Constitutional: Negative for fever or weight change.  Respiratory: Negative for cough and shortness of breath.   Cardiovascular: Negative for chest pain or palpitations.  Gastrointestinal: Negative for abdominal pain, no bowel changes.  Musculoskeletal: Negative for gait problem or joint swelling.  Skin: Negative for rash.  Neurological: Negative for dizziness or headache.  No other specific complaints in a complete review of systems (except as listed in HPI above).   Objective  Vitals:   09/24/21 0859  BP: 126/72  Pulse: 72  Resp: 16  SpO2: 96%  Weight: 178 lb (80.7 kg)  Height: 5' (1.524 m)    Body mass index is 34.76 kg/m.  Physical Exam  Constitutional: Patient appears well-developed and well-nourished. Obese   No distress.  HEENT: head atraumatic, normocephalic, pupils equal and reactive to light, neck supple Cardiovascular: Normal rate, regular rhythm and normal heart sounds.  No murmur heard. No BLE edema. Pulmonary/Chest: Effort normal and breath sounds normal. No respiratory distress. Abdominal: Soft.  There is no tenderness. Psychiatric: Patient has a normal mood and affect. behavior is normal. Judgment and thought content normal.  Skin :senile purpura on left arm   PHQ2/9:    09/24/2021    8:58 AM 07/11/2021    8:46 AM 03/19/2021    9:30 AM 02/12/2021   10:14 AM 12/18/2020    8:36 AM  Depression screen PHQ 2/9  Decreased Interest 0 2 0 0 0  Down, Depressed, Hopeless 0 1 0 0 0  PHQ - 2 Score 0 3  0 0 0  Altered sleeping 0 1 0 0 0  Tired, decreased energy 0 3 0 0 0  Change in appetite 0 0 0 0 0  Feeling bad or failure about yourself  0 0 0 0 0  Trouble concentrating 0 0 0 0 0  Moving slowly or fidgety/restless 0 0 0 0 0  Suicidal thoughts 0 0 0 0 0  PHQ-9 Score 0 7 0 0 0  Difficult doing work/chores   Not difficult at all Not difficult at all Not difficult at all    phq 9 is negative   Fall Risk:    09/24/2021    8:58 AM 07/11/2021    8:46 AM 03/19/2021    9:30 AM 02/12/2021   10:16 AM 12/18/2020    8:36 AM  Fall Risk   Falls in the past year? 0 0 0 0 0  Number falls in past yr: 0 0 0 0 0  Injury with Fall? 0 0 0 0 0  Risk for fall due to : No Fall Risks No Fall Risks No Fall Risks No Fall Risks No Fall Risks  Follow up Falls prevention discussed Falls prevention discussed Falls prevention discussed Falls prevention discussed Falls prevention discussed      Functional Status Survey: Is the patient deaf or have difficulty hearing?: Yes Does the patient have difficulty seeing, even when wearing glasses/contacts?: No Does the patient have difficulty concentrating, remembering, or making decisions?: No Does the patient have difficulty walking or climbing stairs?: No Does the  patient have difficulty dressing or bathing?: No Does the patient have difficulty doing errands alone such as visiting a doctor's office or shopping?: No    Assessment & Plan  1. Memory impairment  Under the care of Dr. Melrose Nakayama  2. Senile purpura ([place])  Reassurance  3. Low serum vitamin B12  - Vitamin B12  4. Pure hypercholesterolemia  - simvastatin (ZOCOR) 20 MG tablet; Take 1 tablet (20 mg total) by mouth at bedtime.  Dispense: 90 tablet; Refill: 1 - Lipid panel - COMPLETE METABOLIC PANEL WITH GFR  5. Dysthymia  - escitalopram (LEXAPRO) 10 MG tablet; Take 1.5 tablets (15 mg total) by mouth daily.  Dispense: 135 tablet; Refill: 1 - buPROPion (WELLBUTRIN XL) 150 MG 24 hr tablet; Take 1 tablet (150 mg total) by mouth daily.  Dispense: 90 tablet; Refill: 1  6. DDD (degenerative disc disease), lumbosacral  - gabapentin (NEURONTIN) 100 MG capsule; Take 1 capsule (100 mg total) by mouth every evening.  Dispense: 90 capsule; Refill: 1  7. Chronic bilateral low back pain without sciatica  - gabapentin (NEURONTIN) 100 MG capsule; Take 1 capsule (100 mg total) by mouth every evening.  Dispense: 90 capsule; Refill: 1  8. Long-term use of high-risk medication  - COMPLETE METABOLIC PANEL WITH GFR

## 2021-09-24 NOTE — Telephone Encounter (Unsigned)
Copied from [place] [id number]. Topic: General - Other >> Sep 24, 2021  2:39 PM Oley Balm E wrote: Reason for CRM: Pt wants to discuss her visit with the nurse, she does not remember the details about her right ear. Cant remember if it was checked  Best contact: [phone]

## 2021-09-25 LAB — COMPLETE METABOLIC PANEL WITH GFR
AG Ratio: 1.4 (calc) (ref 1.0–2.5)
ALT: 14 U/L (ref 6–29)
AST: 17 U/L (ref 10–35)
Albumin: 4.1 g/dL (ref 3.6–5.1)
Alkaline phosphatase (APISO): 43 U/L (ref 37–153)
BUN/Creatinine Ratio: 14 (calc) (ref 6–22)
BUN: 16 mg/dL (ref 7–25)
CO2: 27 mmol/L (ref 20–32)
Calcium: 9.7 mg/dL (ref 8.6–10.4)
Chloride: 104 mmol/L (ref 98–110)
Creat: 1.18 mg/dL — ABNORMAL HIGH (ref 0.60–1.00)
Globulin: 2.9 g/dL (calc) (ref 1.9–3.7)
Glucose, Bld: 92 mg/dL (ref 65–99)
Potassium: 5.3 mmol/L (ref 3.5–5.3)
Sodium: 141 mmol/L (ref 135–146)
Total Bilirubin: 0.3 mg/dL (ref 0.2–1.2)
Total Protein: 7 g/dL (ref 6.1–8.1)
eGFR: 48 mL/min/{1.73_m2} — ABNORMAL LOW (ref >=60)

## 2021-09-25 LAB — LIPID PANEL
Cholesterol: 175 mg/dL (ref <200)
HDL: 71 mg/dL (ref >=50)
LDL Cholesterol (Calc): 86 mg/dL (calc)
Non-HDL Cholesterol (Calc): 104 mg/dL (calc) (ref <130)
Total CHOL/HDL Ratio: 2.5 (calc) (ref <5.0)
Triglycerides: 89 mg/dL (ref <150)

## 2021-09-25 LAB — VITAMIN B12: Vitamin B-12: 1039 pg/mL (ref 200–1100)

## 2021-10-22 ENCOUNTER — Ambulatory Visit: Payer: Self-pay

## 2021-10-22 NOTE — Telephone Encounter (Signed)
Spoke to pt and advised her again about going to UC. Pt is our of town and will be back on Sunday and states if she still having these symptoms on Sunday she will call the office

## 2021-10-22 NOTE — Telephone Encounter (Signed)
  Chief Complaint: Swollen feet and ankles Symptoms: ibid Frequency: last night Pertinent Negatives: Patient denies pain Disposition: '[]'$ ED /'[x]'$ Urgent Care (no appt availability in office) / '[]'$ Appointment(In office/virtual)/ '[]'$  [facility]/ '[]'$ Home Care/ '[]'$ Refused Recommended Disposition /'[]'$ [facility]/ '[]'$  Follow-up with PCP Additional Notes: Pt in [facility] for vacation, arrived Sunday. Has had swollen feet and ankles since last night. Pt has not done much walking. Pt will elevate her feet. Unsure if pt will go to UC.    Summary: Swelling in feet and toes   Swelling in her feet and toes since last night   [phone]      Reason for Disposition  [1] Very swollen ankle AND [2] no fever  Answer Assessment - Initial Assessment Questions 1. LOCATION: "Which ankle is swollen?" "Where is the swelling?"     Both feet and ankles 2. ONSET: "When did the swelling start?"     Last night 3. SIZE: "How large is the swelling?"     Toes  look like sausages pitting 4. PAIN: "Is there any pain?" If Yes, ask: "How bad is it?" (Scale 1-10; or mild, moderate, severe)   - NONE (0): no pain.   - MILD (1-3): doesn't interfere with normal activities.    - MODERATE (4-7): interferes with normal activities (e.g., work or school) or awakens from sleep, limping.    - SEVERE (8-10): excruciating pain, unable to do any normal activities, unable to walk.      none 5. CAUSE: "What do you think caused the ankle swelling?"     no 6. OTHER SYMPTOMS: "Do you have any other symptoms?" (e.g., fever, chest pain, difficulty breathing, calf pain)     no 7. PREGNANCY: "Is there any chance you are pregnant?" "When was your last menstrual period?"     na  Protocols used: Ankle Swelling-A-AH

## 2021-11-07 DIAGNOSIS — H43813 Vitreous degeneration, bilateral: Secondary | ICD-10-CM | POA: Diagnosis not present

## 2021-11-14 ENCOUNTER — Encounter: Payer: Self-pay | Admitting: Emergency Medicine

## 2021-11-14 ENCOUNTER — Emergency Department
Admission: EM | Admit: 2021-11-14 | Discharge: 2021-11-15 | Disposition: A | Discharge status: Home / Self Care | Payer: PPO | Attending: Emergency Medicine | Admitting: Emergency Medicine

## 2021-11-14 DIAGNOSIS — N3001 Acute cystitis with hematuria: Secondary | ICD-10-CM | POA: Diagnosis not present

## 2021-11-14 DIAGNOSIS — D72829 Elevated white blood cell count, unspecified: Secondary | ICD-10-CM | POA: Insufficient documentation

## 2021-11-14 DIAGNOSIS — R300 Dysuria: Secondary | ICD-10-CM | POA: Diagnosis present

## 2021-11-14 DIAGNOSIS — R944 Abnormal results of kidney function studies: Secondary | ICD-10-CM | POA: Diagnosis not present

## 2021-11-14 LAB — BASIC METABOLIC PANEL
Anion gap: 7 (ref 5–15)
BUN: 19 mg/dL (ref 8–23)
CO2: 26 mmol/L (ref 22–32)
Calcium: 9.2 mg/dL (ref 8.9–10.3)
Chloride: 106 mmol/L (ref 98–111)
Creatinine, Ser: 1.07 mg/dL — ABNORMAL HIGH (ref 0.44–1.00)
GFR, Estimated: 54 mL/min — ABNORMAL LOW (ref >60)
Glucose, Bld: 124 mg/dL — ABNORMAL HIGH (ref 70–99)
Potassium: 4.1 mmol/L (ref 3.5–5.1)
Sodium: 139 mmol/L (ref 135–145)

## 2021-11-14 LAB — CBC
HCT: 40.7 % (ref 36.0–46.0)
Hemoglobin: 13.1 g/dL (ref 12.0–15.0)
MCH: 30.1 pg (ref 26.0–34.0)
MCHC: 32.2 g/dL (ref 30.0–36.0)
MCV: 93.6 fL (ref 80.0–100.0)
Platelets: 308 10*3/uL (ref 150–400)
RBC: 4.35 MIL/uL (ref 3.87–5.11)
RDW: 13.9 % (ref 11.5–15.5)
WBC: 12.6 10*3/uL — ABNORMAL HIGH (ref 4.0–10.5)
nRBC: 0 % (ref 0.0–0.2)

## 2021-11-14 LAB — LACTIC ACID, PLASMA: Lactic Acid, Venous: 1.6 mmol/L (ref 0.5–1.9)

## 2021-11-14 MED ORDER — ONDANSETRON HCL 4 MG/2ML IJ SOLN
4.0000 mg | Freq: Once | INTRAMUSCULAR | Status: AC
  Administered 2021-11-14: 4 mg via INTRAVENOUS
  Filled 2021-11-14: qty 2

## 2021-11-14 NOTE — ED Provider Notes (Signed)
[facility] Provider Note    Event Date/Time   First MD Initiated Contact with Patient 11/14/21 2318     (approximate)   History   Dysuria   HPI  Abigail Hatfield is a 76 y.o. female with no contributory past medical history who presents for evaluation of 1 day of increased urinary frequency, severe burning dysuria, middle lower abdominal/pelvic pain, dizziness, nausea, and intermittent chills.  Symptoms that started within the last 12 hours or so.  She does not get frequent urinary tract infections but has had a couple in the past.  She started taking some Azo at home which may or may not have helped, but given the overall constellation of symptoms her daughter brought her to the emergency department for further evaluation.  She has not had any vomiting or diarrhea.  If she stands up too quickly she feels lightheaded but otherwise she feels okay.  She has not had chest pain or shortness of breath.  No measured fever.     Physical Exam   Triage Vital Signs: ED Triage Vitals [11/14/21 2135]  Enc Vitals Group     BP (!) 138/51     Pulse Rate 78     Resp 16     Temp 98.2 F (36.8 C)     Temp Source Oral     SpO2 98 %     Weight 80 kg (176 lb 5.9 oz)     Height      Head Circumference      Peak Flow      Pain Score 7     Pain Loc      Pain Edu?      Excl. in [place]?     Most recent vital signs: Vitals:   11/14/21 2339 11/15/21 0030  BP: (!) 126/59 128/79  Pulse: 71 69  Resp: 19 15  Temp:    SpO2: 97% 96%     General: Awake, no distress.  CV:  Good peripheral perfusion.  Normal heart sounds. Resp:  Normal effort.  Lungs are clear to auscultation bilaterally. Abd:  No distention.  No tenderness to palpation the abdomen including the suprapubic region where she indicated it was hurting her little bit before. Other:  Normal mood and affect.  Pleasant, conversational, no distress.  Laughing and joking with me.   ED Results / Procedures /  Treatments   Labs (all labs ordered are listed, but only abnormal results are displayed) Labs Reviewed  BASIC METABOLIC PANEL - Abnormal; Notable for the following components:      Result Value   Glucose, Bld 124 (*)    Creatinine, Ser 1.07 (*)    GFR, Estimated 54 (*)    All other components within normal limits  CBC - Abnormal; Notable for the following components:   WBC 12.6 (*)    All other components within normal limits  URINALYSIS, ROUTINE W REFLEX MICROSCOPIC - Abnormal; Notable for the following components:   Color, Urine AMBER (*)    APPearance CLOUDY (*)    Hgb urine dipstick LARGE (*)    Protein, ur 100 (*)    Nitrite POSITIVE (*)    Leukocytes,Ua TRACE (*)    RBC / HPF >50 (*)    WBC, UA >50 (*)    All other components within normal limits  URINE CULTURE  CULTURE, BLOOD (SINGLE)  LACTIC ACID, PLASMA     EKG  ED ECG REPORT I, Hinda Kehr, the attending physician,  personally viewed and interpreted this ECG.  Date: 11/14/2021 EKG Time: 21:39 Rate: 79 Rhythm: normal sinus rhythm QRS Axis: normal Intervals: normal ST/T Wave abnormalities: normal Narrative Interpretation: no evidence of acute ischemia    PROCEDURES:  Critical Care performed: No  Procedures   MEDICATIONS ORDERED IN ED: Medications  cefTRIAXone (ROCEPHIN) 2 g in sodium chloride 0.9 % 100 mL IVPB (2 g Intravenous New Bag/Given 11/15/21 0019)  ondansetron (ZOFRAN) injection 4 mg (4 mg Intravenous Given 11/14/21 2227)  sodium chloride 0.9 % bolus 500 mL (500 mLs Intravenous New Bag/Given 11/15/21 0019)     IMPRESSION / MDM / ASSESSMENT AND PLAN / ED COURSE  I reviewed the triage vital signs and the nursing notes.                              Differential diagnosis includes, but is not limited to, UTI/pyelonephritis, renal/ureteral stone, diverticulitis or other acute intra-abdominal infection, sepsis.  Patient's presentation is most consistent with acute presentation with potential  threat to life or bodily function.  Patient has signs and symptoms consistent with UTI.  She has been afebrile in the emergency department.  She had 1 pulse measurement that was over 100 but that seems to be when she had been moving around.  At rest her heart rate is steadily and consistently in the 70s.  Blood pressure is within normal limits.  She may have a degree of postural hypotension when she is standing up and moving around but she has not had any recent volume losses due to vomiting or diarrhea.  Labs/studies ordered include EKG, CBC, basic metabolic panel, single blood culture for possible sepsis, lactic acid, urinalysis.  Urine culture was also ordered.  Patient has a mild leukocytosis of 12.6, but she does not meet SIRS criteria.  Basic metabolic panel is essentially normal other than a very slightly elevated creatinine of 1.07.  Lactic acid is within normal limits.  Urinalysis is strongly positive, positive nitrites, hematuria, leukocytes, and WBCs and RBCs.  No bacteria seen, but there are WBC clumps and the urine culture is pending.  The patient has no flank pain and no tenderness to palpation of the abdomen which is reassuring, and she is well-appearing and in no distress.  I ordered a 500 mL normal saline bolus.  After seeing the results of the urinalysis, I ordered ceftriaxone 2 g IV which is the recommended dose for empiric treatment of community-acquired UTI as per the sepsis protocol.  I talked with the patient and her daughter and they are agreeable to aggressive treatment now with IV antibiotics and outpatient management.  I will reassess her clinical status after fluids and antibiotics but anticipate she can be discharged.  I considered hospitalization given her age, but given that she is feeling better already, does not meet sepsis criteria, and we can get her started with IV ceftriaxone, I think it is appropriate to try outpatient treatment.        FINAL CLINICAL  IMPRESSION(S) / ED DIAGNOSES   Final diagnoses:  Acute cystitis with hematuria     Rx / DC Orders   ED Discharge Orders          Ordered    cefadroxil (DURICEF) 500 MG capsule  2 times daily        11/15/21 0102             Note:  This document was prepared using Dragon voice  recognition software and may include unintentional dictation errors.   Hinda Kehr, MD 11/15/21 [phone]

## 2021-11-14 NOTE — ED Triage Notes (Signed)
Presents from home for polyuria, dysuria, stomach soreness, dizzy, nausea, and chills since this afternoon. Tried azo products today. Some memory changes recently but has already been evaluated by neurology for this. Takes gabapentin, simvastatin, lexapro, wellbutrin. Denies blood in urine

## 2021-11-15 LAB — URINALYSIS, ROUTINE W REFLEX MICROSCOPIC
Bacteria, UA: NONE SEEN
Bilirubin Urine: NEGATIVE
Glucose, UA: NEGATIVE mg/dL
Ketones, ur: NEGATIVE mg/dL
Nitrite: POSITIVE — AB
Protein, ur: 100 mg/dL — AB
RBC / HPF: >50 RBC/hpf — ABNORMAL HIGH (ref 0–5)
Specific Gravity, Urine: 1.017 (ref 1.005–1.030)
WBC, UA: >50 WBC/hpf — ABNORMAL HIGH (ref 0–5)
pH: 5 (ref 5.0–8.0)

## 2021-11-15 MED ORDER — SODIUM CHLORIDE 0.9 % IV BOLUS
500.0000 mL | Freq: Once | INTRAVENOUS | Status: AC
  Administered 2021-11-15: 500 mL via INTRAVENOUS

## 2021-11-15 MED ORDER — CEFADROXIL 500 MG PO CAPS: 1000.0000 mg | ORAL_CAPSULE | Freq: Two times a day (BID) | ORAL | 0 refills | Status: AC

## 2021-11-15 MED ORDER — SODIUM CHLORIDE 0.9 % IV SOLN
2.0000 g | INTRAVENOUS | Status: DC
  Administered 2021-11-15: 2 g via INTRAVENOUS
  Filled 2021-11-15: qty 20

## 2021-11-15 NOTE — ED Provider Notes (Incomplete)
[facility] Provider Note    Event Date/Time   First MD Initiated Contact with Patient 11/14/21 2318     (approximate)   History   Dysuria   HPI  Abigail Hatfield is a 76 y.o. female with no contributory past medical history who presents for evaluation of 1 day of increased urinary frequency, severe burning dysuria, middle lower abdominal/pelvic pain, dizziness, nausea, and intermittent chills.  Symptoms that started within the last 12 hours or so.  She does not get frequent urinary tract infections but has had a couple in the past.  She started taking some Azo at home which may or may not have helped, but given the overall constellation of symptoms her daughter brought her to the emergency department for further evaluation.  She has not had any vomiting or diarrhea.  If she stands up too quickly she feels lightheaded but otherwise she feels okay.  She has not had chest pain or shortness of breath.  No measured fever.     Physical Exam   Triage Vital Signs: ED Triage Vitals [11/14/21 2135]  Enc Vitals Group     BP (!) 138/51     Pulse Rate 78     Resp 16     Temp 98.2 F (36.8 C)     Temp Source Oral     SpO2 98 %     Weight 80 kg (176 lb 5.9 oz)     Height      Head Circumference      Peak Flow      Pain Score 7     Pain Loc      Pain Edu?      Excl. in [place]?     Most recent vital signs: Vitals:   11/14/21 2135 11/14/21 2223  BP: (!) 138/51 128/60  Pulse: 78 (!) 103  Resp: 16   Temp: 98.2 F (36.8 C) 99.2 F (37.3 C)  SpO2: 98% 98%     General: Awake, no distress.  CV:  Good peripheral perfusion.  Normal heart sounds. Resp:  Normal effort.  Lungs are clear to auscultation bilaterally. Abd:  No distention.  No tenderness to palpation the abdomen including the suprapubic region where she indicated it was hurting her little bit before. Other:  Normal mood and affect.  Pleasant, conversational, no distress.  Laughing and joking with  me.   ED Results / Procedures / Treatments   Labs (all labs ordered are listed, but only abnormal results are displayed) Labs Reviewed  BASIC METABOLIC PANEL - Abnormal; Notable for the following components:      Result Value   Glucose, Bld 124 (*)    Creatinine, Ser 1.07 (*)    GFR, Estimated 54 (*)    All other components within normal limits  CBC - Abnormal; Notable for the following components:   WBC 12.6 (*)    All other components within normal limits  URINE CULTURE  CULTURE, BLOOD (SINGLE)  LACTIC ACID, PLASMA  URINALYSIS, ROUTINE W REFLEX MICROSCOPIC  LACTIC ACID, PLASMA     EKG  ED ECG REPORT I, Hinda Kehr, the attending physician, personally viewed and interpreted this ECG.  Date: 11/14/2021 EKG Time: 21:39 Rate: 79 Rhythm: normal sinus rhythm QRS Axis: normal Intervals: normal ST/T Wave abnormalities: normal Narrative Interpretation: no evidence of acute ischemia    RADIOLOGY ***    PROCEDURES:  Critical Care performed: {CriticalCareYesNo:19197::"Yes, see critical care procedure note(s)","No"}  Procedures   MEDICATIONS ORDERED  IN ED: Medications  ondansetron [facility]) injection 4 mg (4 mg Intravenous Given 11/14/21 2227)     IMPRESSION / MDM / ASSESSMENT AND PLAN / ED COURSE  I reviewed the triage vital signs and the nursing notes.                              Differential diagnosis includes, but is not limited to, ***  Patient's presentation is most consistent with {EM COPA:27473}  {If the patient is on the monitor, remove the brackets and asterisks on the sentence below and remember to document it as a Procedure as well. Otherwise delete the sentence below:1} {**The patient is on the cardiac monitor to evaluate for evidence of arrhythmia and/or significant heart rate changes.**} {Remember to include, when applicable, any/all of the following data: independent review of imaging independent review of labs (comment specifically on  pertinent positives and negatives) review of specific prior hospitalizations, PCP/specialist notes, etc. discuss meds given and prescribed document any discussion with consultants (including hospitalists) any clinical decision tools you used and why (PECARN, NEXUS, etc.) did you consider admitting the patient? document social determinants of health affecting patient's care (homelessness, inability to follow up in a timely fashion, etc) document any pre-existing conditions increasing risk on current visit (e.g. diabetes and HTN increasing danger of high-risk chest pain/ACS) describes what meds you gave (especially parenteral) and why any other interventions?:1}     FINAL CLINICAL IMPRESSION(S) / ED DIAGNOSES   Final diagnoses:  None     Rx / DC Orders   ED Discharge Orders     None        Note:  This document was prepared using Dragon voice recognition software and may include unintentional dictation errors.

## 2021-11-15 NOTE — ED Notes (Signed)
Pt verbalized understanding of discharge instructions, prescriptions, and follow-up care instructions. Pt advised if symptoms worsen to return to ED.

## 2021-11-17 LAB — URINE CULTURE: Culture: >=100000 — AB

## 2021-11-19 DIAGNOSIS — H57813 Brow ptosis, bilateral: Secondary | ICD-10-CM | POA: Diagnosis not present

## 2021-11-19 LAB — CULTURE, BLOOD (SINGLE): Culture: NO GROWTH

## 2021-11-28 DIAGNOSIS — F411 Generalized anxiety disorder: Secondary | ICD-10-CM | POA: Diagnosis not present

## 2021-11-28 DIAGNOSIS — R413 Other amnesia: Secondary | ICD-10-CM | POA: Diagnosis not present

## 2021-11-28 DIAGNOSIS — Z8659 Personal history of other mental and behavioral disorders: Secondary | ICD-10-CM | POA: Diagnosis not present

## 2021-12-20 ENCOUNTER — Other Ambulatory Visit: Payer: Self-pay | Admitting: Nurse Practitioner

## 2021-12-20 DIAGNOSIS — F341 Dysthymic disorder: Secondary | ICD-10-CM

## 2021-12-23 NOTE — Telephone Encounter (Signed)
Requested medication (s) are due for refill today: yes  Requested medication (s) are on the active medication list: yes  Last refill:  09/24/21 #90/1  Future visit scheduled: yes  Notes to clinic:  pharmacy is stating pt is taking 2/day but rx is for 1 day. Please advise.     Requested Prescriptions  Pending Prescriptions Disp Refills   buPROPion (WELLBUTRIN XL) 150 MG 24 hr tablet [Pharmacy Med Name: BUPROPION HCL ER (XL) 150 MG TAB] 30 tablet     Sig: TAKE ONE TABLET BY MOUTH EVERY DAY     Psychiatry: Antidepressants - bupropion Failed - 12/20/2021 10:45 AM      Failed - Cr in normal range and within 360 days    Creat  Date Value Ref Range Status  09/24/2021 1.18 (H) 0.60 - 1.00 mg/dL Final   Creatinine, Ser  Date Value Ref Range Status  11/14/2021 1.07 (H) 0.44 - 1.00 mg/dL Final         Passed - AST in normal range and within 360 days    AST  Date Value Ref Range Status  09/24/2021 17 10 - 35 U/L Final         Passed - ALT in normal range and within 360 days    ALT  Date Value Ref Range Status  09/24/2021 14 6 - 29 U/L Final         Passed - Completed PHQ-2 or PHQ-9 in the last 360 days      Passed - Last BP in normal range    BP Readings from Last 1 Encounters:  11/15/21 121/76         Passed - Valid encounter within last 6 months    Recent Outpatient Visits           3 months ago Memory impairment   [facility] Steele Sizer, MD   5 months ago Cognitive impairment   [facility] Steele Sizer, MD   9 months ago Senile purpura [facility])   [facility] Steele Sizer, MD   1 year ago Well adult exam   [facility] Steele Sizer, MD   1 year ago [facility] Steele Sizer, MD       Future Appointments             In 1 month  [facility] [place]   In 3 months Steele Sizer, MD [facility], [facility]

## 2021-12-24 ENCOUNTER — Other Ambulatory Visit: Payer: Self-pay

## 2021-12-24 DIAGNOSIS — F341 Dysthymic disorder: Secondary | ICD-10-CM

## 2021-12-24 MED ORDER — BUPROPION HCL ER (XL) 150 MG PO TB24: 150.0000 mg | ORAL_TABLET | Freq: Every day | ORAL | 0 refills | Status: DC

## 2021-12-25 DIAGNOSIS — R300 Dysuria: Secondary | ICD-10-CM | POA: Diagnosis not present

## 2021-12-25 DIAGNOSIS — N3001 Acute cystitis with hematuria: Secondary | ICD-10-CM | POA: Diagnosis not present

## 2022-01-03 DIAGNOSIS — H35371 Puckering of macula, right eye: Secondary | ICD-10-CM | POA: Diagnosis not present

## 2022-01-03 DIAGNOSIS — H43813 Vitreous degeneration, bilateral: Secondary | ICD-10-CM | POA: Diagnosis not present

## 2022-01-03 DIAGNOSIS — H26493 Other secondary cataract, bilateral: Secondary | ICD-10-CM | POA: Diagnosis not present

## 2022-01-14 ENCOUNTER — Ambulatory Visit: Payer: Self-pay | Admitting: *Deleted

## 2022-01-14 NOTE — Telephone Encounter (Signed)
Pt confused on a lot of her medication and can not remember dose of Lexapro. She states someone in office told her to take two so she did not have to break them. I told her the rx states one and a half per day. She can not remember if she already took meds today. I encouraged her to get a pill box and she said she will have her pharmacists pack them in pockets like her husbands. I encouraged her to do so.  Answer Assessment - Initial Assessment Questions 1. REASON FOR CALL or QUESTION: "What is your reason for calling today?" or "How can I best help you?" or "What question do you have that I can help answer?"     Pt thought she was supposed to take two Lexapro because she told the doctor it was hard to break in half and she states the doctor said go ahead and take two a day.  Protocols used: Information Only Call - No Triage-A-AH

## 2022-01-14 NOTE — Telephone Encounter (Signed)
Summary: how to take escitalopram (LEXAPRO) 10 MG tablet   Patient called in asking how she should take, escitalopram (LEXAPRO) 10 MG tablet, She wants to know if she should be taking 2 instead of 1 or 1.5. she thought she was told to take 21.     Visit 09/27/21: - escitalopram (LEXAPRO) 10 MG tablet; Take 1.5 tablets (15 mg total) by mouth daily.  Dispense: 135 tablet; Refill: 1 Attempted to call patient- left message to call office

## 2022-01-14 NOTE — Telephone Encounter (Signed)
Took call, started new encounter.

## 2022-02-11 ENCOUNTER — Other Ambulatory Visit: Payer: Self-pay | Admitting: Family Medicine

## 2022-02-11 DIAGNOSIS — E7800 Pure hypercholesterolemia, unspecified: Secondary | ICD-10-CM

## 2022-02-11 NOTE — Telephone Encounter (Signed)
Has appt in dec

## 2022-02-13 ENCOUNTER — Ambulatory Visit (INDEPENDENT_AMBULATORY_CARE_PROVIDER_SITE_OTHER): Payer: PPO

## 2022-02-13 DIAGNOSIS — Z0000 Encounter for general adult medical examination without abnormal findings: Secondary | ICD-10-CM

## 2022-02-13 DIAGNOSIS — Z1231 Encounter for screening mammogram for malignant neoplasm of breast: Secondary | ICD-10-CM | POA: Diagnosis not present

## 2022-02-13 NOTE — Progress Notes (Signed)
Subjective:  I connected with  Abigail Hatfield on 02/13/22 by a audio enabled telemedicine application and verified that I am speaking with the correct person using two identifiers.  Patient Location: Home  Provider Location: Office/Clinic  I discussed the limitations of evaluation and management by telemedicine. The patient expressed understanding and agreed to proceed.  Abigail Hatfield is a 76 y.o. female who presents for Medicare Annual (Subsequent) preventive examination.  Review of Systems    Defer to PCP       Objective:    There were no vitals filed for this visit. There is no height or weight on file to calculate BMI.     11/14/2021    9:38 PM 02/12/2021   10:15 AM 02/09/2020   10:59 AM 12/07/2018    2:29 PM 06/06/2018    5:35 PM 12/24/2017    8:48 AM 12/08/2017    2:22 PM  Advanced Directives  Does Patient Have a Medical Advance Directive? No Yes Yes Yes No Yes Yes  Type of Corporate treasurer of [place];Living will [place];Living will [place];Living will  [place];Living will [place];Living will  Copy of [place] in Chart?  No - copy requested No - copy requested No - copy requested  Yes No - copy requested  Would patient like information on creating a medical advance directive?     No - Patient declined No - Patient declined     Current Medications (verified) Outpatient Encounter Medications as of 02/13/2022  Medication Sig   buPROPion (WELLBUTRIN XL) 150 MG 24 hr tablet Take 1 tablet (150 mg total) by mouth daily.   Cholecalciferol (VITAMIN D3) 5000 UNITS CAPS Take 5,000 Units by mouth every other day.    escitalopram (LEXAPRO) 10 MG tablet Take 1.5 tablets (15 mg total) by mouth daily.   gabapentin (NEURONTIN) 100 MG capsule Take 1 capsule (100 mg total) by mouth every evening.   pyridoxine (B-6) 100 MG tablet Take 100 mg by mouth daily.    simvastatin (ZOCOR) 20 MG tablet TAKE 1 TABLET BY MOUTH AT BEDTIME   vitamin B-12 (CYANOCOBALAMIN) 1000 MCG tablet Take 1,000 mcg by mouth daily.   No facility-administered encounter medications on file as of 02/13/2022.    Allergies (verified) Moxifloxacin hcl in nacl, Prednisone, and Penicillins   History: Past Medical History:  Diagnosis Date   Allergy    Cataract    Hyperlipidemia    Medical history non-contributory    Past Surgical History:  Procedure Laterality Date   ABDOMINAL HYSTERECTOMY  1980   CATARACT EXTRACTION W/PHACO Right 06/24/2017   Procedure: CATARACT EXTRACTION PHACO AND INTRAOCULAR LENS PLACEMENT ([place]);  Surgeon: Birder Robson, MD;  Location: [facility] ORS;  Service: Ophthalmology;  Laterality: Right;  [place] 00:48.0 AP% 16.5 CDE 7.92 Fluid Pack Lot # [id number]   CATARACT EXTRACTION W/PHACO Left 07/14/2017   Procedure: CATARACT EXTRACTION PHACO AND INTRAOCULAR LENS PLACEMENT (IOC);  Surgeon: Birder Robson, MD;  Location: [facility] ORS;  Service: Ophthalmology;  Laterality: Left;  [place] 00:36.0 AP% 16.9 CDE 6.08 Fluid Pack Lot # [id number]   CESAREAN SECTION  1967   CESAREAN SECTION  1971   CHOLECYSTECTOMY  1972   COLONOSCOPY WITH PROPOFOL N/A 12/24/2017   Procedure: COLONOSCOPY WITH PROPOFOL;  Surgeon: Jonathon Bellows, MD;  Location: [facility] END[facility]COPY;  Service: Gastroenterology;  Laterality: N/A;   EYE SURGERY     retal fistula repair  Dr. Jamal Collin x 2   Family History  Problem Relation Age of Onset   Diabetes Mother    Hypertension Mother    Heart attack Mother    Heart failure Father    Heart disease Father    Diabetes Brother    Hypertension Brother    COPD Brother    Diabetes Sister    Breast cancer Neg Hx    Social History   Socioeconomic History   Marital status: Married    Spouse name: Percell Miller   Number of children: 2   Years of education: some college   Highest education level: 12th grade  Occupational History   Occupation: Retired    Comment: since  04/2008  Tobacco Use   Smoking status: Former    Packs/day: 0.50    Years: 5.00    Total pack years: 2.50    Types: Cigarettes    Quit date: 12/07/1988    Years since quitting: 33.2   Smokeless tobacco: Never   Tobacco comments:    smoking cessation materials not required  Vaping Use   Vaping Use: Never used  Substance and Sexual Activity   Alcohol use: No    Alcohol/week: 0.0 standard drinks of alcohol   Drug use: No   Sexual activity: Not Currently    Partners: Male    Birth control/protection: None    Comment: patient's husband has prostate issues so she cannot with him. has not in 32yr.  Other Topics Concern   Not on file  Social History Narrative   Mother is 135yrs old that has dementia, staying with her now      Husband has prostate issues and early dementia, he has been losing his patience and it has been stressful       Patient has 7 deteriorated disc   Social Determinants of Health   Financial Resource Strain: Low Risk  (02/12/2021)   Overall Financial Resource Strain (CARDIA)    Difficulty of Paying Living Expenses: Not hard at all  Food Insecurity: No Food Insecurity (02/12/2021)   Hunger Vital Sign    Worried About Running Out of Food in the Last Year: Never true    [place] the Last [place]: Never true  Transportation Needs: No Transportation Needs (02/12/2021)   PRAPARE - THydrologist(Medical): No    Lack of Transportation (Non-Medical): No  Physical Activity: Inactive (02/12/2021)   Exercise Vital Sign    Days of Exercise per Week: 0 days    Minutes of Exercise per Session: 0 min  Stress: No Stress Concern Present (02/12/2021)   [place]   Feeling of Stress : Not at all  Social Connections: Moderately Integrated (02/12/2021)   Social Connection and Isolation Panel [NHANES]    Frequency of Communication with Friends and Family: More than three times a  week    Frequency of Social Gatherings with Friends and Family: Once a week    Attends Religious Services: More than 4 times per year    Active Member of [employer] Organizations: No    Attends CArchivistMeetings: Never    Marital Status: Married    Tobacco Counseling Counseling given: Not Answered Tobacco comments: smoking cessation materials not required   Clinical Intake:                 Diabetic?No         Activities of  Daily Living    09/24/2021    8:58 AM 07/11/2021    8:46 AM  In your present state of health, do you have any difficulty performing the following activities:  Hearing? 1 0  Vision? 0 0  Difficulty concentrating or making decisions? 0 0  Walking or climbing stairs? 0 0  Dressing or bathing? 0 0  Doing errands, shopping? 0 0    Patient Care Team: Steele Sizer, MD as PCP - General (Family Medicine) Birder Robson, MD as Consulting Physician (Ophthalmology) Ralene Bathe, MD as Consulting Physician (Dermatology) Cathi Roan, [facility] (Inactive) (Pharmacist) [place], [place], LCSW as Social Worker  Indicate any recent Medical Services you may have received from other than [facility] providers in the past year (date may be approximate).     Assessment:   This is a routine wellness examination for [employer].  Hearing/Vision screen No results found.  Dietary issues and exercise activities discussed:     Goals Addressed   None   Depression Screen    09/24/2021    8:58 AM 07/11/2021    8:46 AM 03/19/2021    9:30 AM 02/12/2021   10:14 AM 12/18/2020    8:36 AM 10/19/2020   11:49 AM 10/10/2020    8:09 AM  PHQ 2/9 Scores  PHQ - 2 Score 0 3 0 0 0 0 6  PHQ- 9 Score 0 7 0 0 0 0 6    Fall Risk    09/24/2021    8:58 AM 07/11/2021    8:46 AM 03/19/2021    9:30 AM 02/12/2021   10:16 AM 12/18/2020    8:36 AM  Fall Risk   Falls in the past year? 0 0 0 0 0  Number falls in past yr: 0 0 0 0 0  Injury with Fall? 0 0 0 0 0  Risk for  fall due to : No Fall Risks No Fall Risks No Fall Risks No Fall Risks No Fall Risks  Follow up Falls prevention discussed Falls prevention discussed Falls prevention discussed Falls prevention discussed Falls prevention discussed    FALL RISK PREVENTION PERTAINING TO THE HOME:  Any stairs in or around the home? Yes  If so, are there any without handrails? No  Home free of loose throw rugs in walkways, pet beds, electrical cords, etc? Yes  Adequate lighting in your home to reduce risk of falls? Yes   ASSISTIVE DEVICES UTILIZED TO PREVENT FALLS:  Life alert? No  Use of a cane, Fr[place] or w/c? No  Grab bars in the bathroom? Yes  Shower chair or bench in shower? Yes  Elevated toilet seat or a handicapped toilet? Yes   TIMED UP AND GO:  Was the test performed? No .  Length of time to ambulate 10 feet: N/A sec.    Cognitive Function:        12/07/2018    2:43 PM 12/08/2017    2:23 PM  6CIT Screen  What Year? 0 points 0 points  What month? 0 points 0 points  What time? 0 points 0 points  Count back from 20 0 points 0 points  Months in reverse 0 points 0 points  Repeat phrase 0 points 0 points  Total Score 0 points 0 points    Immunizations There is no immunization history for the selected administration types on file for this patient.  TDAP status: Due, Education has been provided regarding the importance of this vaccine. Advised may receive this vaccine at local  pharmacy or Health Dept. Aware to provide a copy of the vaccination record if obtained from local pharmacy or Health Dept. Verbalized acceptance and understanding.  Flu Vaccine status: Due, Education has been provided regarding the importance of this vaccine. Advised may receive this vaccine at local pharmacy or Health Dept. Aware to provide a copy of the vaccination record if obtained from local pharmacy or Health Dept. Verbalized acceptance and understanding.  Pneumococcal vaccine status: Due, Education has been  provided regarding the importance of this vaccine. Advised may receive this vaccine at local pharmacy or Health Dept. Aware to provide a copy of the vaccination record if obtained from local pharmacy or Health Dept. Verbalized acceptance and understanding.  Covid-19 vaccine status: Declined, Education has been provided regarding the importance of this vaccine but patient still declined. Advised may receive this vaccine at local pharmacy or Health Dept.or vaccine clinic. Aware to provide a copy of the vaccination record if obtained from local pharmacy or Health Dept. Verbalized acceptance and understanding.  Qualifies for Shingles Vaccine? Yes   Zostavax completed No   Shingrix Completed?: No.    Education has been provided regarding the importance of this vaccine. Patient has been advised to call insurance company to determine out of pocket expense if they have not yet received this vaccine. Advised may also receive vaccine at local pharmacy or Health Dept. Verbalized acceptance and understanding.  Screening Tests Health Maintenance  Topic Date Due   COVID-19 Vaccine (1) Never done   TETANUS/TDAP  Never done   Zoster Vaccines- Shingrix (1 of 2) Never done   Pneumonia Vaccine 56+ Years old (1 - PCV) Never done   INFLUENZA VACCINE  Never done   MAMMOGRAM  01/28/2022   COLONOSCOPY (Pts 45-80yr Insurance coverage will need to be confirmed)  12/25/2022   Medicare Annual Wellness (AWV)  02/14/2023   DEXA SCAN  Completed   Hepatitis C Screening  Completed   HPV VACCINES  Aged Out    Health Maintenance  Health Maintenance Due  Topic Date Due   COVID-19 Vaccine (1) Never done   TETANUS/TDAP  Never done   Zoster Vaccines- Shingrix (1 of 2) Never done   Pneumonia Vaccine 77 Years old (1 - PCV) Never done   INFLUENZA VACCINE  Never done   MAMMOGRAM  01/28/2022    Colorectal cancer screening: Type of screening: Colonoscopy. Completed 12/24/2017. Repeat every 5 years  Mammogram status:  Ordered 02/13/2022. Pt provided with contact info and advised to call to schedule appt.   Bone Density status: Completed 04/01/2021. Results reflect: Bone density results: NORMAL. Repeat every 5 years.  Lung Cancer Screening: (Low Dose CT Chest recommended if Age 77-80years, 30 pack-year currently smoking OR have quit w/in 15years.) does not qualify.   Lung Cancer Screening Referral: n/a  Additional Screening:  Hepatitis C Screening: does not qualify; Completed 11/26/2016  Vision Screening: Recommended annual ophthalmology exams for early detection of glaucoma and other disorders of the eye. Is the patient up to date with their annual eye exam?  Yes  Who is the provider or what is the name of the office in which the patient attends annual eye exams? [facility] If pt is not established with a provider, would they like to be referred to a provider to establish care? No.   Dental Screening: Recommended annual dental exams for proper oral hygiene  Community Resource Referral / Chronic Care Management: CRR required this visit?  No   CCM required  this visit?  No      Plan:     I have personally reviewed and noted the following in the patient's chart:   Medical and social history Use of alcohol, tobacco or illicit drugs  Current medications and supplements including opioid prescriptions. Patient is not currently taking opioid prescriptions. Functional ability and status Nutritional status Physical activity Advanced directives List of other physicians Hospitalizations, surgeries, and ER visits in previous 12 months Vitals Screenings to include cognitive, depression, and falls Referrals and appointments  In addition, I have reviewed and discussed with patient certain preventive protocols, quality metrics, and best practice recommendations. A written personalized care plan for preventive services as well as general preventive health recommendations were  provided to patient.     Royal Hawthorn, CMA   02/13/2022   Nurse Notes:   Ms. Scharnhorst , Thank you for taking time to come for your Medicare Wellness Visit. I appreciate your ongoing commitment to your health goals. Please review the following plan we discussed and let me know if I can assist you in the future.   These are the goals we discussed:  Goals       DIET - INCREASE WATER INTAKE      Recommend to drink at least 6-8 8oz glasses of water per day.      Lower cholesterol (pt-stated)      Current Barriers:  Knowledge Deficits related to basic understanding of hyperlipidemia pathophysiology and self care management Knowledge Deficits related to understanding of medications prescribed for management of hyperlipidemia  Clinical Pharmacist Goal(s):  Over the next 30 days, patient will verbalize understanding of plan for cholesterol management, through diet, exercise, and prescription medication management (statin)  Interventions:  Counseling on pathophysiology of high cholesterol Counseling on diet and exercise and the effects on cholesterol Education on the importance of statins and the benefits of statin over red yeast rice   Patient Self Care Activities:  Calls provider office for new concerns or questions  Initial goal documentation        Manage My Emotions      Timeframe:  Long-Range Goal Priority:  Medium Start Date:     10/19/20                        Expected End Date:    109/23               Follow Up Date 04/22/21 - - practice setting personal boundaries - practice relaxation or meditation daily - talk about feelings with a friend, family or spiritual advisor - practice positive thinking and self-talk    Why is this important?   When you are stressed, down or upset, your body reacts too.  For example, your blood pressure may get higher; you may have a headache or stomachache.  When your emotions get the best of you, your body's ability to fight off cold and  flu gets weak.  These steps will help you manage your emotions.     Notes:         This is a list of the screening recommended for you and due dates:  Health Maintenance  Topic Date Due   COVID-19 Vaccine (1) Never done   Tetanus Vaccine  Never done   Zoster (Shingles) Vaccine (1 of 2) Never done   Pneumonia Vaccine (1 - PCV) Never done   Flu Shot  Never done   Mammogram  01/28/2022   Colon Cancer  Screening  12/25/2022   Medicare Annual Wellness Visit  02/14/2023   DEXA scan (bone density measurement)  Completed   Hepatitis C Screening: USPSTF Recommendation to screen - Ages 47-79 yo.  Completed   HPV Vaccine  Aged Out

## 2022-03-03 DIAGNOSIS — E785 Hyperlipidemia, unspecified: Secondary | ICD-10-CM | POA: Diagnosis not present

## 2022-03-03 DIAGNOSIS — Z87891 Personal history of nicotine dependence: Secondary | ICD-10-CM | POA: Diagnosis not present

## 2022-03-03 DIAGNOSIS — F330 Major depressive disorder, recurrent, mild: Secondary | ICD-10-CM | POA: Diagnosis not present

## 2022-03-03 DIAGNOSIS — G4700 Insomnia, unspecified: Secondary | ICD-10-CM | POA: Diagnosis not present

## 2022-03-03 DIAGNOSIS — E669 Obesity, unspecified: Secondary | ICD-10-CM | POA: Diagnosis not present

## 2022-03-24 NOTE — Progress Notes (Signed)
Name: Abigail Hatfield   MRN: [id number]    DOB: 04/01/1946   Date:03/25/2022       Progress Note  Subjective  Chief Complaint  Follow Up  HPI  Dysthymia: she was very depressed earlier 2022 She was given zoloft and symptoms got worse, crying, unable to make decisions. Daughter was really worried. We changed to lexapro and she is currently taking 15 mg plus Wellbutrin XL 150 mg . She is coping much better with stress , she moved in  ( her husband also) with her mother to be able to help her out She was also grieving the loss of her brother that died last year but feeling much better now  No longer having episodes crying spells.  More assertive and feels like her normal self. She is seeing Dr. Malvin Johns for memory decline, had normal MSS and SLUMS  17/30 and likely secondary to stress, advised to continue taking anti-depressants She states she has been doing better, she is still living with her mother that is 5 years old now. Per Dr .Daisy Blossom note from 11/2021 she is supposed to be taking 20 mg of lexapro per day , I will send new dose to pharmacy   CKI stage III: discussed avoiding NSAID's, we will recheck labs. She has good urine output and denies pruritus   Senile purpura: she has it on both arms , reassurance given to the patient again Stable    Hyperlipidemia: she is taking simvastatin and last LDL is down to 90, no side effects of medication. We will recheck labs     Chronic back pain: she sees a chiropractor, has daily pain, from cervical spine to lumbar spine, uses tens units prn to control symptoms.She thinks she is taking gabapentin at night and seems to help. Denies radiculitis    B12 deficiency: continue supplementation, denies fatigue, or paresthesia . Refills sent    Patient Active Problem List   Diagnosis Date Noted   Memory impairment 09/24/2021   Dysthymia 09/05/2020   Hyperglycemia 09/05/2020   Low serum vitamin B12 09/05/2020   Chronic bilateral low back pain without  sciatica 09/05/2020   Obesity (BMI 30.0-34.9) 12/07/2017   Degenerative joint disease of low back 12/07/2017   Senile purpura (HCC) 12/07/2017   Hyperlipidemia 12/21/2014    Past Surgical History:  Procedure Laterality Date   ABDOMINAL HYSTERECTOMY  1980   CATARACT EXTRACTION W/PHACO Right 06/24/2017   Procedure: CATARACT EXTRACTION PHACO AND INTRAOCULAR LENS PLACEMENT (I[facility]);  Surgeon: Galen Manila, MD;  Location: [facility] ORS;  Service: Ophthalmology;  Laterality: Right;  [place] 00:48.0 AP% 16.5 CDE 7.92 Fluid Pack Lot # [id number]   CATARACT EXTRACTION W/PHACO Left 07/14/2017   Procedure: CATARACT EXTRACTION PHACO AND INTRAOCULAR LENS PLACEMENT ([facility]);  Surgeon: Galen Manila, MD;  Location: [facility] ORS;  Service: Ophthalmology;  Laterality: Left;  [place] 00:36.0 AP% 16.9 CDE 6.08 Fluid Pack Lot # [id number]   CESAREAN SECTION  1967   CESAREAN SECTION  1971   CHOLECYSTECTOMY  1972   COLONOSCOPY WITH PROPOFOL N/A 12/24/2017   Procedure: COLONOSCOPY WITH PROPOFOL;  Surgeon: Wyline Mood, MD;  Location: [facility] ENDOSCOPY;  Service: Gastroenterology;  Laterality: N/A;   EYE SURGERY     retal fistula repair     Dr. Evette Cristal x 2    Family History  Problem Relation Age of Onset   Diabetes Mother    Hypertension Mother    Heart attack Mother    Heart failure Father    Heart disease Father  Diabetes Brother    Hypertension Brother    COPD Brother    Diabetes Sister    Breast cancer Neg Hx     Social History   Tobacco Use   Smoking status: Former    Packs/day: 0.50    Years: 5.00    Total pack years: 2.50    Types: Cigarettes    Quit date: 12/07/1988    Years since quitting: 33.3   Smokeless tobacco: Never   Tobacco comments:    smoking cessation materials not required  Substance Use Topics   Alcohol use: No    Alcohol/week: 0.0 standard drinks of alcohol     Current Outpatient Medications:    Cholecalciferol (VITAMIN D3) 50 MCG (2000 UT) capsule, Take 1 capsule (2,000 Units  total) by mouth daily., Disp: 90 capsule, Rfl: 1   buPROPion (WELLBUTRIN XL) 150 MG 24 hr tablet, Take 1 tablet (150 mg total) by mouth in the morning., Disp: 90 tablet, Rfl: 1   cyanocobalamin (VITAMIN B12) 1000 MCG tablet, Take 1 tablet (1,000 mcg total) by mouth daily., Disp: 90 tablet, Rfl: 1   escitalopram (LEXAPRO) 20 MG tablet, Take 1 tablet (20 mg total) by mouth every morning., Disp: 90 tablet, Rfl: 1   gabapentin (NEURONTIN) 100 MG capsule, Take 1 capsule (100 mg total) by mouth at bedtime., Disp: 90 capsule, Rfl: 1   simvastatin (ZOCOR) 20 MG tablet, Take 1 tablet (20 mg total) by mouth at bedtime., Disp: 90 tablet, Rfl: 1  Allergies  Allergen Reactions   Moxifloxacin Hcl In Nacl Swelling   Prednisone Other (See Comments)    Paranoid   Penicillins Rash and Other (See Comments)    Has patient had a PCN reaction causing immediate rash, facial/tongue/throat swelling, SOB or lightheadedness with hypotension: Unknown Has patient had a PCN reaction causing severe rash involving mucus membranes or skin necrosis: No Has patient had a PCN reaction that required hospitalization: Yes Has patient had a PCN reaction occurring within the last 10 years: No If all of the above answers are "NO", then may proceed with Cephalosporin use.     I personally reviewed active problem list, medication list, allergies, family history, social history, health maintenance with the patient/caregiver today.   ROS  Constitutional: Negative for fever or weight change.  Respiratory: Negative for cough and shortness of breath.   Cardiovascular: Negative for chest pain or palpitations.  Gastrointestinal: Negative for abdominal pain, no bowel changes.  Musculoskeletal: Negative for gait problem or joint swelling.  Skin: Negative for rash.  Neurological: Negative for dizziness or headache.  No other specific complaints in a complete review of systems (except as listed in HPI above).   Objective  Vitals:    03/25/22 0902  BP: 136/74  Pulse: 84  Resp: 16  Temp: 98.3 F (36.8 C)  TempSrc: Oral  SpO2: 95%  Weight: 179 lb 11.2 oz (81.5 kg)  Height: 5' (1.524 m)    Body mass index is 35.1 kg/m.  Physical Exam  Constitutional: Patient appears well-developed and well-nourished. Obese  No distress.  HEENT: head atraumatic, normocephalic, pupils equal and reactive to light,, neck supple Cardiovascular: Normal rate, regular rhythm and normal heart sounds.  No murmur heard. No BLE edema. Pulmonary/Chest: Effort normal and breath sounds normal. No respiratory distress. Abdominal: Soft.  There is no tenderness. Psychiatric: Patient has a normal mood and affect. behavior is normal. Judgment and thought content normal.   PHQ2/9:    03/25/2022    9:04 AM 02/13/2022  9:37 AM 09/24/2021    8:58 AM 07/11/2021    8:46 AM 03/19/2021    9:30 AM  Depression screen PHQ 2/9  Decreased Interest 0 0 0 2 0  Down, Depressed, Hopeless 0 0 0 1 0  PHQ - 2 Score 0 0 0 3 0  Altered sleeping 0  0 1 0  Tired, decreased energy 0  0 3 0  Change in appetite 0  0 0 0  Feeling bad or failure about yourself  0  0 0 0  Trouble concentrating 1  0 0 0  Moving slowly or fidgety/restless 0  0 0 0  Suicidal thoughts 0  0 0 0  PHQ-9 Score 1  0 7 0  Difficult doing work/chores Not difficult at all    Not difficult at all    phq 9 is negative   Fall Risk:    03/25/2022    9:03 AM 02/13/2022    9:40 AM 09/24/2021    8:58 AM 07/11/2021    8:46 AM 03/19/2021    9:30 AM  Fall Risk   Falls in the past year? 0 0 0 0 0  Number falls in past yr:   0 0 0  Injury with Fall?   0 0 0  Risk for fall due to : No Fall Risks  No Fall Risks No Fall Risks No Fall Risks  Follow up Falls prevention discussed;Education provided;Falls evaluation completed Falls prevention discussed;Education provided Falls prevention discussed Falls prevention discussed Falls prevention discussed      Functional Status Survey: Is the patient deaf  or have difficulty hearing?: No Does the patient have difficulty seeing, even when wearing glasses/contacts?: No Does the patient have difficulty concentrating, remembering, or making decisions?: Yes Does the patient have difficulty walking or climbing stairs?: No Does the patient have difficulty dressing or bathing?: No Does the patient have difficulty doing errands alone such as visiting a doctor's office or shopping?: No    Assessment & Plan  1. Senile purpura (HCC)  Reassurance given   2. Chronic kidney insufficiency, stage 3 (moderate) (HCC)  - COMPLETE METABOLIC PANEL WITH GFR - PTH, intact and calcium  3. Dysthymia  - buPROPion (WELLBUTRIN XL) 150 MG 24 hr tablet; Take 1 tablet (150 mg total) by mouth in the morning.  Dispense: 90 tablet; Refill: 1 - escitalopram (LEXAPRO) 20 MG tablet; Take 1 tablet (20 mg total) by mouth every morning.  Dispense: 90 tablet; Refill: 1  4. Low serum vitamin B12  Continue supplementation   5. Memory impairment  stable  6. Pure hypercholesterolemia  - simvastatin (ZOCOR) 20 MG tablet; Take 1 tablet (20 mg total) by mouth at bedtime.  Dispense: 90 tablet; Refill: 1  7. Breast cancer screening by mammogram  - MM 3D SCREEN BREAST BILATERAL; Future  8. DDD (degenerative disc disease), lumbosacral  - gabapentin (NEURONTIN) 100 MG capsule; Take 1 capsule (100 mg total) by mouth at bedtime.  Dispense: 90 capsule; Refill: 1  9. Chronic bilateral low back pain without sciatica  - gabapentin (NEURONTIN) 100 MG capsule; Take 1 capsule (100 mg total) by mouth at bedtime.  Dispense: 90 capsule; Refill: 1  10. Cognitive impairment   11. Long-term use of high-risk medication  - COMPLETE METABOLIC PANEL WITH GFR - CBC with Differential/Platelet

## 2022-03-25 ENCOUNTER — Encounter: Payer: Self-pay | Admitting: Family Medicine

## 2022-03-25 ENCOUNTER — Ambulatory Visit: Payer: Self-pay

## 2022-03-25 ENCOUNTER — Ambulatory Visit (INDEPENDENT_AMBULATORY_CARE_PROVIDER_SITE_OTHER): Payer: PPO | Admitting: Family Medicine

## 2022-03-25 VITALS — BP 136/74 | HR 84 | Temp 98.3°F | Resp 16 | Ht 60.0 in | Wt 179.7 lb

## 2022-03-25 DIAGNOSIS — E538 Deficiency of other specified B group vitamins: Secondary | ICD-10-CM | POA: Diagnosis not present

## 2022-03-25 DIAGNOSIS — Z1231 Encounter for screening mammogram for malignant neoplasm of breast: Secondary | ICD-10-CM | POA: Diagnosis not present

## 2022-03-25 DIAGNOSIS — D692 Other nonthrombocytopenic purpura: Secondary | ICD-10-CM

## 2022-03-25 DIAGNOSIS — M5137 Other intervertebral disc degeneration, lumbosacral region: Secondary | ICD-10-CM

## 2022-03-25 DIAGNOSIS — R4189 Other symptoms and signs involving cognitive functions and awareness: Secondary | ICD-10-CM

## 2022-03-25 DIAGNOSIS — G8929 Other chronic pain: Secondary | ICD-10-CM

## 2022-03-25 DIAGNOSIS — R413 Other amnesia: Secondary | ICD-10-CM | POA: Diagnosis not present

## 2022-03-25 DIAGNOSIS — E7800 Pure hypercholesterolemia, unspecified: Secondary | ICD-10-CM | POA: Diagnosis not present

## 2022-03-25 DIAGNOSIS — M545 Low back pain, unspecified: Secondary | ICD-10-CM

## 2022-03-25 DIAGNOSIS — N183 Chronic kidney disease, stage 3 unspecified: Secondary | ICD-10-CM

## 2022-03-25 DIAGNOSIS — F341 Dysthymic disorder: Secondary | ICD-10-CM | POA: Diagnosis not present

## 2022-03-25 DIAGNOSIS — Z79899 Other long term (current) drug therapy: Secondary | ICD-10-CM | POA: Diagnosis not present

## 2022-03-25 MED ORDER — ESCITALOPRAM OXALATE 10 MG PO TABS: 15.0000 mg | ORAL_TABLET | ORAL | 1 refills | Status: DC

## 2022-03-25 MED ORDER — SIMVASTATIN 20 MG PO TABS: 20.0000 mg | ORAL_TABLET | Freq: Every day | ORAL | 1 refills | Status: DC

## 2022-03-25 MED ORDER — BUPROPION HCL ER (XL) 150 MG PO TB24
150.0000 mg | ORAL_TABLET | Freq: Every morning | ORAL | 1 refills | Status: DC
Start: 2022-03-25 — End: 2022-07-08

## 2022-03-25 MED ORDER — GABAPENTIN 100 MG PO CAPS: 100.0000 mg | ORAL_CAPSULE | Freq: Every day | ORAL | 1 refills | Status: DC

## 2022-03-25 MED ORDER — VITAMIN D3 50 MCG (2000 UT) PO CAPS: 2000.0000 [IU] | ORAL_CAPSULE | Freq: Every day | ORAL | 1 refills | Status: AC

## 2022-03-25 MED ORDER — ESCITALOPRAM OXALATE 20 MG PO TABS
20.0000 mg | ORAL_TABLET | ORAL | 1 refills | Status: DC
Start: 2022-03-25 — End: 2022-07-09

## 2022-03-25 MED ORDER — VITAMIN B-12 1000 MCG PO TABS: 1000.0000 ug | ORAL_TABLET | Freq: Every day | ORAL | 1 refills | Status: AC

## 2022-03-25 NOTE — Telephone Encounter (Signed)
  Chief Complaint: medication assistance  Symptoms: NA Frequency: today Pertinent Negatives: NA Disposition: '[]'$ ED /'[]'$ Urgent Care (no appt availability in office) / '[]'$ Appointment(In office/virtual)/ '[]'$  [facility]/ '[x]'$ Home Care/ '[]'$ Refused Recommended Disposition /'[]'$ [facility]/ '[]'$  Follow-up with PCP Additional Notes: spoke with Alyson Ingles, Rx Tech. Advised her that dose was increased as requested by Dr. Melrose Nakayama from Dr. Ancil Boozer today to '20mg'$  QD. No further assistance needed.   Summary: Pharmacy is requesting Rx clarification.   McKenzie from [place] is requesting clarification on medication escitalopram (LEXAPRO) Mckenzie stated pt was currently taking 10 mg, 1 and a half tablets.Received Rx today for escitalopram (LEXAPRO) 10 MG and escitalopram (LEXAPRO) 20 MG with Notes to Pharmacy: New dose as recommended by Dr. Melrose Nakayama, stop lexapro 10 mg to take 15 mg per day  Pharmacy is requesting clarification.       Reason for Disposition  Pharmacy calling with prescription question and triager answers question  Answer Assessment - Initial Assessment Questions 1. NAME of MEDICINE: "What medicine(s) are you calling about?"     lexapro 2. QUESTION: "What is your question?" (e.g., double dose of medicine, side effect)     Needing clarification on dosage since pt has been on '15mg'$  previously  3. PRESCRIBER: "Who prescribed the medicine?" Reason: if prescribed by specialist, call should be referred to that group.     Dr. Ancil Boozer  Protocols used: Medication Question Call-A-AH

## 2022-03-26 LAB — COMPLETE METABOLIC PANEL WITH GFR
AG Ratio: 1.6 (calc) (ref 1.0–2.5)
ALT: 14 U/L (ref 6–29)
AST: 19 U/L (ref 10–35)
Albumin: 4.1 g/dL (ref 3.6–5.1)
Alkaline phosphatase (APISO): 44 U/L (ref 37–153)
BUN/Creatinine Ratio: 18 (calc) (ref 6–22)
BUN: 19 mg/dL (ref 7–25)
CO2: 29 mmol/L (ref 20–32)
Calcium: 9.3 mg/dL (ref 8.6–10.4)
Chloride: 105 mmol/L (ref 98–110)
Creat: 1.05 mg/dL — ABNORMAL HIGH (ref 0.60–1.00)
Globulin: 2.6 g/dL (calc) (ref 1.9–3.7)
Glucose, Bld: 88 mg/dL (ref 65–99)
Potassium: 5.1 mmol/L (ref 3.5–5.3)
Sodium: 143 mmol/L (ref 135–146)
Total Bilirubin: 0.4 mg/dL (ref 0.2–1.2)
Total Protein: 6.7 g/dL (ref 6.1–8.1)
eGFR: 55 mL/min/{1.73_m2} — ABNORMAL LOW (ref >=60)

## 2022-03-26 LAB — CBC WITH DIFFERENTIAL/PLATELET
Absolute Monocytes: 326 cells/uL (ref 200–950)
Basophils Absolute: 71 cells/uL (ref 0–200)
Basophils Relative: 1.4 %
Eosinophils Absolute: 158 cells/uL (ref 15–500)
Eosinophils Relative: 3.1 %
HCT: 37.4 % (ref 35.0–45.0)
Hemoglobin: 12.5 g/dL (ref 11.7–15.5)
Lymphs Abs: 1586 cells/uL (ref 850–3900)
MCH: 30.7 pg (ref 27.0–33.0)
MCHC: 33.4 g/dL (ref 32.0–36.0)
MCV: 91.9 fL (ref 80.0–100.0)
MPV: 10.9 fL (ref 7.5–12.5)
Monocytes Relative: 6.4 %
Neutro Abs: 2958 cells/uL (ref 1500–7800)
Neutrophils Relative %: 58 %
Platelets: 300 10*3/uL (ref 140–400)
RBC: 4.07 10*6/uL (ref 3.80–5.10)
RDW: 13 % (ref 11.0–15.0)
Total Lymphocyte: 31.1 %
WBC: 5.1 10*3/uL (ref 3.8–10.8)

## 2022-03-26 LAB — PTH, INTACT AND CALCIUM
Calcium: 9.3 mg/dL (ref 8.6–10.4)
PTH: 36 pg/mL (ref 16–77)

## 2022-04-01 ENCOUNTER — Ambulatory Visit
Admission: RE | Admit: 2022-04-01 | Discharge: 2022-04-01 | Disposition: A | Discharge status: Home / Self Care | Payer: PPO | Source: Ambulatory Visit | Attending: Family Medicine | Admitting: Family Medicine

## 2022-04-01 DIAGNOSIS — Z1231 Encounter for screening mammogram for malignant neoplasm of breast: Secondary | ICD-10-CM | POA: Insufficient documentation

## 2022-05-07 DIAGNOSIS — M9901 Segmental and somatic dysfunction of cervical region: Secondary | ICD-10-CM | POA: Diagnosis not present

## 2022-05-07 DIAGNOSIS — M5136 Other intervertebral disc degeneration, lumbar region: Secondary | ICD-10-CM | POA: Diagnosis not present

## 2022-05-07 DIAGNOSIS — M62830 Muscle spasm of back: Secondary | ICD-10-CM | POA: Diagnosis not present

## 2022-05-07 DIAGNOSIS — M9903 Segmental and somatic dysfunction of lumbar region: Secondary | ICD-10-CM | POA: Diagnosis not present

## 2022-05-08 DIAGNOSIS — M5136 Other intervertebral disc degeneration, lumbar region: Secondary | ICD-10-CM | POA: Diagnosis not present

## 2022-05-08 DIAGNOSIS — M62830 Muscle spasm of back: Secondary | ICD-10-CM | POA: Diagnosis not present

## 2022-05-08 DIAGNOSIS — M9903 Segmental and somatic dysfunction of lumbar region: Secondary | ICD-10-CM | POA: Diagnosis not present

## 2022-05-08 DIAGNOSIS — M9901 Segmental and somatic dysfunction of cervical region: Secondary | ICD-10-CM | POA: Diagnosis not present

## 2022-05-09 DIAGNOSIS — M9903 Segmental and somatic dysfunction of lumbar region: Secondary | ICD-10-CM | POA: Diagnosis not present

## 2022-05-09 DIAGNOSIS — M9901 Segmental and somatic dysfunction of cervical region: Secondary | ICD-10-CM | POA: Diagnosis not present

## 2022-05-09 DIAGNOSIS — M5136 Other intervertebral disc degeneration, lumbar region: Secondary | ICD-10-CM | POA: Diagnosis not present

## 2022-05-09 DIAGNOSIS — M62830 Muscle spasm of back: Secondary | ICD-10-CM | POA: Diagnosis not present

## 2022-05-12 DIAGNOSIS — M9901 Segmental and somatic dysfunction of cervical region: Secondary | ICD-10-CM | POA: Diagnosis not present

## 2022-05-12 DIAGNOSIS — M62830 Muscle spasm of back: Secondary | ICD-10-CM | POA: Diagnosis not present

## 2022-05-12 DIAGNOSIS — M9903 Segmental and somatic dysfunction of lumbar region: Secondary | ICD-10-CM | POA: Diagnosis not present

## 2022-05-12 DIAGNOSIS — M5136 Other intervertebral disc degeneration, lumbar region: Secondary | ICD-10-CM | POA: Diagnosis not present

## 2022-05-14 DIAGNOSIS — M62830 Muscle spasm of back: Secondary | ICD-10-CM | POA: Diagnosis not present

## 2022-05-14 DIAGNOSIS — M9903 Segmental and somatic dysfunction of lumbar region: Secondary | ICD-10-CM | POA: Diagnosis not present

## 2022-05-14 DIAGNOSIS — M9901 Segmental and somatic dysfunction of cervical region: Secondary | ICD-10-CM | POA: Diagnosis not present

## 2022-05-14 DIAGNOSIS — M5136 Other intervertebral disc degeneration, lumbar region: Secondary | ICD-10-CM | POA: Diagnosis not present

## 2022-05-19 DIAGNOSIS — M9903 Segmental and somatic dysfunction of lumbar region: Secondary | ICD-10-CM | POA: Diagnosis not present

## 2022-05-19 DIAGNOSIS — M5136 Other intervertebral disc degeneration, lumbar region: Secondary | ICD-10-CM | POA: Diagnosis not present

## 2022-05-19 DIAGNOSIS — M62830 Muscle spasm of back: Secondary | ICD-10-CM | POA: Diagnosis not present

## 2022-05-19 DIAGNOSIS — M9901 Segmental and somatic dysfunction of cervical region: Secondary | ICD-10-CM | POA: Diagnosis not present

## 2022-05-20 DIAGNOSIS — R208 Other disturbances of skin sensation: Secondary | ICD-10-CM | POA: Diagnosis not present

## 2022-05-20 DIAGNOSIS — M79671 Pain in right foot: Secondary | ICD-10-CM | POA: Diagnosis not present

## 2022-05-20 DIAGNOSIS — M7751 Other enthesopathy of right foot: Secondary | ICD-10-CM | POA: Diagnosis not present

## 2022-05-21 DIAGNOSIS — M9901 Segmental and somatic dysfunction of cervical region: Secondary | ICD-10-CM | POA: Diagnosis not present

## 2022-05-21 DIAGNOSIS — M9903 Segmental and somatic dysfunction of lumbar region: Secondary | ICD-10-CM | POA: Diagnosis not present

## 2022-05-21 DIAGNOSIS — M5136 Other intervertebral disc degeneration, lumbar region: Secondary | ICD-10-CM | POA: Diagnosis not present

## 2022-05-21 DIAGNOSIS — M62830 Muscle spasm of back: Secondary | ICD-10-CM | POA: Diagnosis not present

## 2022-05-23 DIAGNOSIS — M9901 Segmental and somatic dysfunction of cervical region: Secondary | ICD-10-CM | POA: Diagnosis not present

## 2022-05-23 DIAGNOSIS — M5136 Other intervertebral disc degeneration, lumbar region: Secondary | ICD-10-CM | POA: Diagnosis not present

## 2022-05-23 DIAGNOSIS — M62830 Muscle spasm of back: Secondary | ICD-10-CM | POA: Diagnosis not present

## 2022-05-23 DIAGNOSIS — M9903 Segmental and somatic dysfunction of lumbar region: Secondary | ICD-10-CM | POA: Diagnosis not present

## 2022-05-26 DIAGNOSIS — M9901 Segmental and somatic dysfunction of cervical region: Secondary | ICD-10-CM | POA: Diagnosis not present

## 2022-05-26 DIAGNOSIS — M9903 Segmental and somatic dysfunction of lumbar region: Secondary | ICD-10-CM | POA: Diagnosis not present

## 2022-05-26 DIAGNOSIS — M5136 Other intervertebral disc degeneration, lumbar region: Secondary | ICD-10-CM | POA: Diagnosis not present

## 2022-05-26 DIAGNOSIS — M62830 Muscle spasm of back: Secondary | ICD-10-CM | POA: Diagnosis not present

## 2022-05-28 DIAGNOSIS — M9901 Segmental and somatic dysfunction of cervical region: Secondary | ICD-10-CM | POA: Diagnosis not present

## 2022-05-28 DIAGNOSIS — M5136 Other intervertebral disc degeneration, lumbar region: Secondary | ICD-10-CM | POA: Diagnosis not present

## 2022-05-28 DIAGNOSIS — M9903 Segmental and somatic dysfunction of lumbar region: Secondary | ICD-10-CM | POA: Diagnosis not present

## 2022-05-28 DIAGNOSIS — M62830 Muscle spasm of back: Secondary | ICD-10-CM | POA: Diagnosis not present

## 2022-05-29 DIAGNOSIS — Z8659 Personal history of other mental and behavioral disorders: Secondary | ICD-10-CM | POA: Diagnosis not present

## 2022-05-29 DIAGNOSIS — F411 Generalized anxiety disorder: Secondary | ICD-10-CM | POA: Diagnosis not present

## 2022-05-29 DIAGNOSIS — R413 Other amnesia: Secondary | ICD-10-CM | POA: Diagnosis not present

## 2022-07-01 NOTE — Progress Notes (Signed)
Name: Abigail Hatfield   MRN: [id number]    DOB: [birth year]   Date:07/02/2022       Progress Note  Subjective  Chief Complaint  Worsening Memory  HPI  Dysthymia: she was very depressed earlier 2022 She was given zoloft and symptoms got worse, crying, unable to make decisions. Daughter was really worried. We changed to lexapro and added Wellbutrin . She is coping much better with stress , she moved in  ( her husband also) with her mother to be able to help her out .  More assertive and feels like her normal self, however daughter has noticed that she stays in her night gown most of the day and has not been going out ( patient states that she has always done that) .   Cognitive Dysfunction:  She is seeing Dr. Melrose Nakayama for memory decline, had normal MSS and SLUMS  17/30 and likely secondary to stress, advised to continue taking anti-depressants however daughter is very concerned about her mother being a victim of phone scam multiple times since Dec 2023. Patient does not want to only have cash/allowance. She states she is fine. Her last MMS done in 2024 at Dr. Lannie Fields office was 10/30   CKI stage III: She has good urine output and denies pruritus BP is at goal    Senile purpura: she has it on both arms , reassurance given to the patient again Stable     Hyperlipidemia: she is taking simvastatin 20 mg and denies side effects    Chronic back pain: she sees a chiropractor, has daily pain, from cervical spine to lumbar spine, uses tens units prn to control symptoms. She is not sure if taking gabapentin daily, daughter states sitting more than usual and getting weak, we will place referral to PT    B12 deficiency: continue supplementation, denies fatigue, or paresthesia .   Patient Active Problem List   Diagnosis Date Noted   Memory impairment 09/24/2021   Dysthymia 09/05/2020   Hyperglycemia 09/05/2020   Low serum vitamin B12 09/05/2020   Chronic bilateral low back pain without sciatica  09/05/2020   Obesity (BMI 30.0-34.9) 12/07/2017   Degenerative joint disease of low back 12/07/2017   Senile purpura ([place]) 12/07/2017   Hyperlipidemia 12/21/2014    Past Surgical History:  Procedure Laterality Date   ABDOMINAL HYSTERECTOMY  1980   CATARACT EXTRACTION W/PHACO Right 06/24/2017   Procedure: CATARACT EXTRACTION PHACO AND INTRAOCULAR LENS PLACEMENT ([place]);  Surgeon: Birder Robson, MD;  Location: [facility] ORS;  Service: Ophthalmology;  Laterality: Right;  [place] 00:48.0 AP% 16.5 CDE 7.92 Fluid Pack Lot # [id number] H   CATARACT EXTRACTION W/PHACO Left 07/14/2017   Procedure: CATARACT EXTRACTION PHACO AND INTRAOCULAR LENS PLACEMENT (IOC);  Surgeon: Birder Robson, MD;  Location: [facility] ORS;  Service: Ophthalmology;  Laterality: Left;  [place] 00:36.0 AP% 16.9 CDE 6.08 Fluid Pack Lot # [id number]   CESAREAN SECTION  1967   CESAREAN SECTION  1971   CHOLECYSTECTOMY  1972   COLONOSCOPY WITH PROPOFOL N/A 12/24/2017   Procedure: COLONOSCOPY WITH PROPOFOL;  Surgeon: Jonathon Bellows, MD;  Location: [facility] ENDOSCOPY;  Service: Gastroenterology;  Laterality: N/A;   EYE SURGERY     retal fistula repair     Dr. Jamal Collin x 2    Family History  Problem Relation Age of Onset   Diabetes Mother    Hypertension Mother    Heart attack Mother    Heart failure Father    Heart disease Father    Diabetes Brother  Hypertension Brother    COPD Brother    Diabetes Sister    Breast cancer Neg Hx     Social History   Tobacco Use   Smoking status: Former    Packs/day: 0.50    Years: 5.00    Additional pack years: 0.00    Total pack years: 2.50    Types: Cigarettes    Quit date: 12/07/1988    Years since quitting: 33.5   Smokeless tobacco: Never   Tobacco comments:    smoking cessation materials not required  Substance Use Topics   Alcohol use: No    Alcohol/week: 0.0 standard drinks of alcohol     Current Outpatient Medications:    buPROPion (WELLBUTRIN XL) 150 MG 24 hr tablet, Take 1 tablet (150  mg total) by mouth in the morning., Disp: 90 tablet, Rfl: 1   Cholecalciferol (VITAMIN D3) 50 MCG (2000 UT) capsule, Take 1 capsule (2,000 Units total) by mouth daily., Disp: 90 capsule, Rfl: 1   cyanocobalamin (VITAMIN B12) 1000 MCG tablet, Take 1 tablet (1,000 mcg total) by mouth daily., Disp: 90 tablet, Rfl: 1   escitalopram (LEXAPRO) 20 MG tablet, Take 1 tablet (20 mg total) by mouth every morning., Disp: 90 tablet, Rfl: 1   gabapentin (NEURONTIN) 100 MG capsule, Take 1 capsule (100 mg total) by mouth at bedtime., Disp: 90 capsule, Rfl: 1   simvastatin (ZOCOR) 20 MG tablet, Take 1 tablet (20 mg total) by mouth at bedtime., Disp: 90 tablet, Rfl: 1  Allergies  Allergen Reactions   Moxifloxacin Hcl In Nacl Swelling   Prednisone Other (See Comments)    Paranoid   Penicillins Rash and Other (See Comments)    Has patient had a PCN reaction causing immediate rash, facial/tongue/throat swelling, SOB or lightheadedness with hypotension: Unknown Has patient had a PCN reaction causing severe rash involving mucus membranes or skin necrosis: No Has patient had a PCN reaction that required hospitalization: Yes Has patient had a PCN reaction occurring within the last 10 years: No If all of the above answers are "NO", then may proceed with Cephalosporin use.     I personally reviewed active problem list, medication list, allergies, family history, social history, health maintenance with the patient/caregiver today.   ROS  Ten systems reviewed and is negative except as mentioned in HPI   Objective  Vitals:   07/02/22 1422  BP: 128/76  Pulse: 76  Resp: 16  SpO2: 96%  Weight: 186 lb (84.4 kg)  Height: 5' (1.524 m)    Body mass index is 36.33 kg/m.  Physical Exam  Constitutional: Patient appears well-developed and well-nourished. Obese  No distress.  HEENT: head atraumatic, normocephalic, pupils equal and reactive to light, neck supple Cardiovascular: Normal rate, regular rhythm and  normal heart sounds.  No murmur heard. No BLE edema. Pulmonary/Chest: Effort normal and breath sounds normal. No respiratory distress. Abdominal: Soft.  There is no tenderness. Psychiatric: Patient has a normal mood and affect. behavior is normal. Daughter and patient arguing about her symptoms   PHQ2/9:    07/02/2022    2:21 PM 03/25/2022    9:04 AM 02/13/2022    9:37 AM 09/24/2021    8:58 AM 07/11/2021    8:46 AM  Depression screen PHQ 2/9  Decreased Interest 1 0 0 0 2  Down, Depressed, Hopeless 0 0 0 0 1  PHQ - 2 Score 1 0 0 0 3  Altered sleeping 0 0  0 1  Tired, decreased energy 0  0  0 3  Change in appetite 0 0  0 0  Feeling bad or failure about yourself  0 0  0 0  Trouble concentrating 3 1  0 0  Moving slowly or fidgety/restless 0 0  0 0  Suicidal thoughts 0 0  0 0  PHQ-9 Score 4 1  0 7  Difficult doing work/chores  Not difficult at all       phq 9 is positive   Fall Risk:    07/02/2022    2:21 PM 03/25/2022    9:03 AM 02/13/2022    9:40 AM 09/24/2021    8:58 AM 07/11/2021    8:46 AM  Fall Risk   Falls in the past year? 1 0 0 0 0  Number falls in past yr: 0   0 0  Injury with Fall? 0   0 0  Risk for fall due to : No Fall Risks No Fall Risks  No Fall Risks No Fall Risks  Follow up Falls prevention discussed Falls prevention discussed;Education provided;Falls evaluation completed Falls prevention discussed;Education provided Falls prevention discussed Falls prevention discussed      Functional Status Survey: Is the patient deaf or have difficulty hearing?: No Does the patient have difficulty seeing, even when wearing glasses/contacts?: No Does the patient have difficulty concentrating, remembering, or making decisions?: Yes Does the patient have difficulty walking or climbing stairs?: Yes Does the patient have difficulty dressing or bathing?: No Does the patient have difficulty doing errands alone such as visiting a doctor's office or shopping?: No    Assessment &  Plan  1. Cognitive impairment  - Ambulatory referral to Home Health - TSH - VITAMIN D 25 Hydroxy (Vit-D Deficiency, Fractures)  2. Dysthymia  - Ambulatory referral to [place] - VITAMIN D 25 Hydroxy (Vit-D Deficiency, Fractures)  3. Low serum vitamin B12  - B12 and Folate Panel - CBC with Differential/Platelet  4. DDD (degenerative disc disease), lumbosacral  Referral for home PT placed  5. Chronic kidney insufficiency, stage 3 (moderate) (HCC)  - CBC with Differential/Platelet - COMPLETE METABOLIC PANEL WITH GFR  6. Chronic bilateral low back pain without sciatica  - Ambulatory referral to [facility]  7. Gait difficulty  - Ambulatory referral to [place]

## 2022-07-02 ENCOUNTER — Ambulatory Visit (INDEPENDENT_AMBULATORY_CARE_PROVIDER_SITE_OTHER): Payer: PPO | Admitting: Family Medicine

## 2022-07-02 ENCOUNTER — Encounter: Payer: Self-pay | Admitting: Family Medicine

## 2022-07-02 VITALS — BP 128/76 | HR 76 | Resp 16 | Ht 60.0 in | Wt 186.0 lb

## 2022-07-02 DIAGNOSIS — N183 Chronic kidney disease, stage 3 unspecified: Secondary | ICD-10-CM | POA: Diagnosis not present

## 2022-07-02 DIAGNOSIS — G8929 Other chronic pain: Secondary | ICD-10-CM | POA: Diagnosis not present

## 2022-07-02 DIAGNOSIS — M545 Low back pain, unspecified: Secondary | ICD-10-CM | POA: Diagnosis not present

## 2022-07-02 DIAGNOSIS — R269 Unspecified abnormalities of gait and mobility: Secondary | ICD-10-CM | POA: Diagnosis not present

## 2022-07-02 DIAGNOSIS — E538 Deficiency of other specified B group vitamins: Secondary | ICD-10-CM

## 2022-07-02 DIAGNOSIS — D692 Other nonthrombocytopenic purpura: Secondary | ICD-10-CM | POA: Diagnosis not present

## 2022-07-02 DIAGNOSIS — R4189 Other symptoms and signs involving cognitive functions and awareness: Secondary | ICD-10-CM

## 2022-07-02 DIAGNOSIS — F341 Dysthymic disorder: Secondary | ICD-10-CM | POA: Diagnosis not present

## 2022-07-02 DIAGNOSIS — N2889 Other specified disorders of kidney and ureter: Secondary | ICD-10-CM

## 2022-07-02 DIAGNOSIS — M51379 Other intervertebral disc degeneration, lumbosacral region without mention of lumbar back pain or lower extremity pain: Secondary | ICD-10-CM

## 2022-07-02 DIAGNOSIS — M5137 Other intervertebral disc degeneration, lumbosacral region: Secondary | ICD-10-CM

## 2022-07-03 LAB — CBC WITH DIFFERENTIAL/PLATELET
Absolute Monocytes: 421 cells/uL (ref 200–950)
Basophils Absolute: 92 cells/uL (ref 0–200)
Basophils Relative: 1.5 %
Eosinophils Absolute: 281 cells/uL (ref 15–500)
Eosinophils Relative: 4.6 %
HCT: 37.6 % (ref 35.0–45.0)
Hemoglobin: 12.4 g/dL (ref 11.7–15.5)
Lymphs Abs: 2544 cells/uL (ref 850–3900)
MCH: 30.9 pg (ref 27.0–33.0)
MCHC: 33 g/dL (ref 32.0–36.0)
MCV: 93.8 fL (ref 80.0–100.0)
MPV: 10.9 fL (ref 7.5–12.5)
Monocytes Relative: 6.9 %
Neutro Abs: 2763 cells/uL (ref 1500–7800)
Neutrophils Relative %: 45.3 %
Platelets: 313 10*3/uL (ref 140–400)
RBC: 4.01 10*6/uL (ref 3.80–5.10)
RDW: 12.9 % (ref 11.0–15.0)
Total Lymphocyte: 41.7 %
WBC: 6.1 10*3/uL (ref 3.8–10.8)

## 2022-07-03 LAB — COMPLETE METABOLIC PANEL WITH GFR
AG Ratio: 1.7 (calc) (ref 1.0–2.5)
ALT: 14 U/L (ref 6–29)
AST: 19 U/L (ref 10–35)
Albumin: 4.1 g/dL (ref 3.6–5.1)
Alkaline phosphatase (APISO): 46 U/L (ref 37–153)
BUN: 20 mg/dL (ref 7–25)
CO2: 29 mmol/L (ref 20–32)
Calcium: 9.3 mg/dL (ref 8.6–10.4)
Chloride: 105 mmol/L (ref 98–110)
Creat: 0.93 mg/dL (ref 0.60–1.00)
Globulin: 2.4 g/dL (calc) (ref 1.9–3.7)
Glucose, Bld: 101 mg/dL — ABNORMAL HIGH (ref 65–99)
Potassium: 5 mmol/L (ref 3.5–5.3)
Sodium: 143 mmol/L (ref 135–146)
Total Bilirubin: 0.2 mg/dL (ref 0.2–1.2)
Total Protein: 6.5 g/dL (ref 6.1–8.1)
eGFR: 63 mL/min/{1.73_m2} (ref >=60)

## 2022-07-03 LAB — VITAMIN D 25 HYDROXY (VIT D DEFICIENCY, FRACTURES): Vit D, 25-Hydroxy: 40 ng/mL (ref 30–100)

## 2022-07-03 LAB — B12 AND FOLATE PANEL
Folate: 13 ng/mL
Vitamin B-12: 1060 pg/mL (ref 200–1100)

## 2022-07-03 LAB — TSH: TSH: 2.08 mIU/L (ref 0.40–4.50)

## 2022-07-07 ENCOUNTER — Telehealth: Payer: Self-pay | Admitting: Family Medicine

## 2022-07-07 NOTE — Telephone Encounter (Signed)
Copied from [place] [id number]. Topic: General - Other >> Jul 07, 2022  9:02 AM Leone Payor F wrote: Reason for CRM: DeWona with [facility] is calling to let Dr. Ancil Boozer know they will be going out tomorrow 03/26 for skilled nursing and physical therapy for the pt.

## 2022-07-08 ENCOUNTER — Other Ambulatory Visit: Payer: Self-pay

## 2022-07-08 ENCOUNTER — Telehealth: Payer: Self-pay

## 2022-07-08 DIAGNOSIS — E538 Deficiency of other specified B group vitamins: Secondary | ICD-10-CM | POA: Diagnosis not present

## 2022-07-08 DIAGNOSIS — M5137 Other intervertebral disc degeneration, lumbosacral region: Secondary | ICD-10-CM | POA: Diagnosis not present

## 2022-07-08 DIAGNOSIS — N183 Chronic kidney disease, stage 3 unspecified: Secondary | ICD-10-CM | POA: Diagnosis not present

## 2022-07-08 DIAGNOSIS — Z9181 History of falling: Secondary | ICD-10-CM | POA: Diagnosis not present

## 2022-07-08 DIAGNOSIS — Z87891 Personal history of nicotine dependence: Secondary | ICD-10-CM | POA: Diagnosis not present

## 2022-07-08 DIAGNOSIS — E785 Hyperlipidemia, unspecified: Secondary | ICD-10-CM | POA: Diagnosis not present

## 2022-07-08 DIAGNOSIS — F331 Major depressive disorder, recurrent, moderate: Secondary | ICD-10-CM | POA: Diagnosis not present

## 2022-07-08 DIAGNOSIS — Z90710 Acquired absence of both cervix and uterus: Secondary | ICD-10-CM | POA: Diagnosis not present

## 2022-07-08 DIAGNOSIS — I10 Essential (primary) hypertension: Secondary | ICD-10-CM | POA: Diagnosis not present

## 2022-07-08 DIAGNOSIS — G8929 Other chronic pain: Secondary | ICD-10-CM | POA: Diagnosis not present

## 2022-07-08 DIAGNOSIS — R4189 Other symptoms and signs involving cognitive functions and awareness: Secondary | ICD-10-CM | POA: Diagnosis not present

## 2022-07-08 DIAGNOSIS — D692 Other nonthrombocytopenic purpura: Secondary | ICD-10-CM | POA: Diagnosis not present

## 2022-07-08 NOTE — Telephone Encounter (Signed)
Received a call from a [place]. He stated her pill packs are not matching the medication list we had on her AVS from 07/02/22 appt. It looks likes in Feb. Dr. Melrose Nakayama did medication changes and the patient did not notify us to changes at her appt. He has increased Gabapentin, added Paxil, and stopped Wellbutrin. In their notes they state they spoke with [facility] pharmacy about those rx for her pill packs. I informed him he needs to contact Dr. Lannie Fields office for medication clarification so we can make sure we have the correct medications on file.

## 2022-07-09 ENCOUNTER — Telehealth: Payer: Self-pay

## 2022-07-09 NOTE — Telephone Encounter (Signed)
Medication list reconciled and updated.

## 2022-07-09 NOTE — Telephone Encounter (Signed)
Jinny Blossom, CMA from [facility] calling to report discrepancy in medication list with PCP and HH. Reviewed medications on file with her and only changes that need to be made are Lexapro has been stopped several weeks ago and Gabapentin 200mg  BID. Advised her I would send to provider as FYI so medication list can be updated. No further assistance needed.

## 2022-07-15 DIAGNOSIS — G8929 Other chronic pain: Secondary | ICD-10-CM | POA: Diagnosis not present

## 2022-07-15 DIAGNOSIS — Z90710 Acquired absence of both cervix and uterus: Secondary | ICD-10-CM | POA: Diagnosis not present

## 2022-07-15 DIAGNOSIS — E785 Hyperlipidemia, unspecified: Secondary | ICD-10-CM | POA: Diagnosis not present

## 2022-07-15 DIAGNOSIS — N183 Chronic kidney disease, stage 3 unspecified: Secondary | ICD-10-CM | POA: Diagnosis not present

## 2022-07-15 DIAGNOSIS — D692 Other nonthrombocytopenic purpura: Secondary | ICD-10-CM | POA: Diagnosis not present

## 2022-07-15 DIAGNOSIS — I10 Essential (primary) hypertension: Secondary | ICD-10-CM | POA: Diagnosis not present

## 2022-07-15 DIAGNOSIS — R4189 Other symptoms and signs involving cognitive functions and awareness: Secondary | ICD-10-CM | POA: Diagnosis not present

## 2022-07-15 DIAGNOSIS — Z9181 History of falling: Secondary | ICD-10-CM | POA: Diagnosis not present

## 2022-07-15 DIAGNOSIS — M5137 Other intervertebral disc degeneration, lumbosacral region: Secondary | ICD-10-CM | POA: Diagnosis not present

## 2022-07-15 DIAGNOSIS — E538 Deficiency of other specified B group vitamins: Secondary | ICD-10-CM | POA: Diagnosis not present

## 2022-07-15 DIAGNOSIS — Z87891 Personal history of nicotine dependence: Secondary | ICD-10-CM | POA: Diagnosis not present

## 2022-07-15 DIAGNOSIS — F331 Major depressive disorder, recurrent, moderate: Secondary | ICD-10-CM | POA: Diagnosis not present

## 2022-07-19 IMAGING — MG MM DIGITAL SCREENING BILAT W/ TOMO AND CAD
8 series · 8 of 24 positions shown · non-contrast
Comparison: Previous exam(s).

CLINICAL DATA: Screening.

EXAM:
DIGITAL SCREENING BILATERAL MAMMOGRAM WITH TOMOSYNTHESIS AND CAD
TECHNIQUE: Bilateral screening digital craniocaudal and mediolateral oblique
mammograms were obtained. Bilateral screening digital breast
tomosynthesis was performed. The images were evaluated with
computer-aided detection.

[R MLO synth-2D]
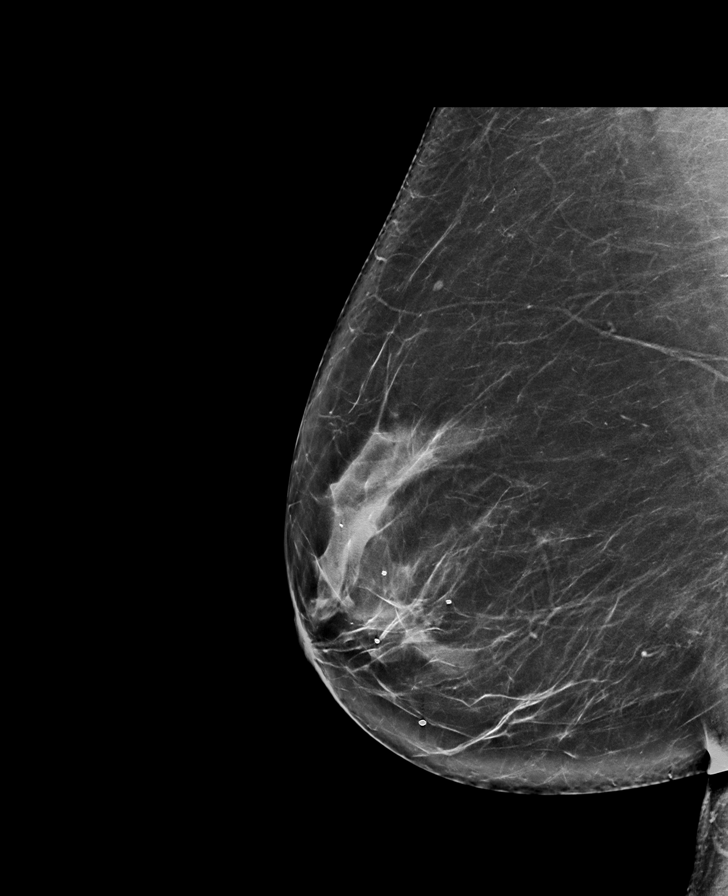

[L MLO synth-2D]
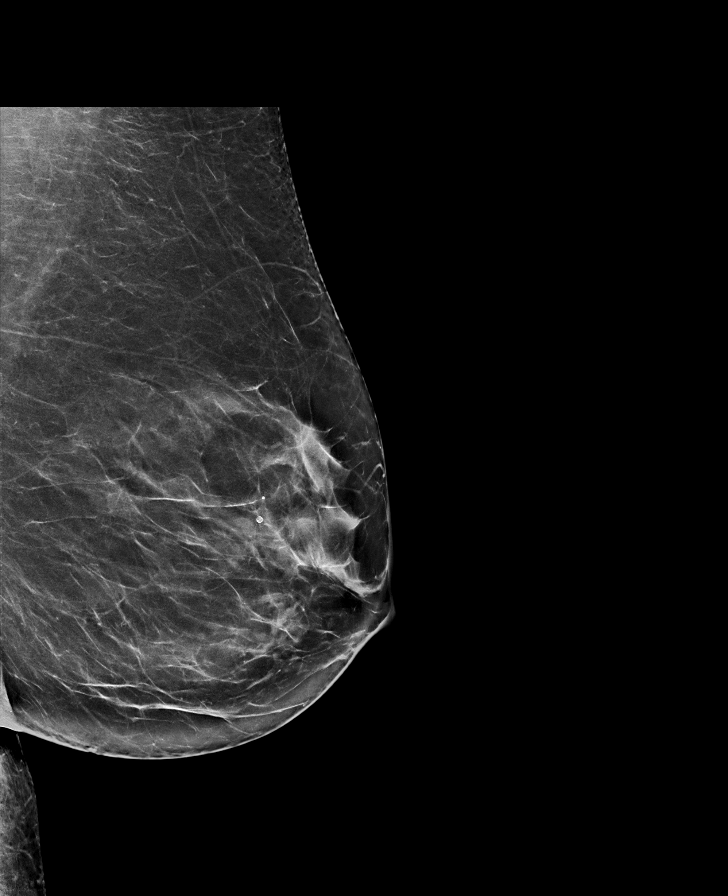

[R CC synth-2D]
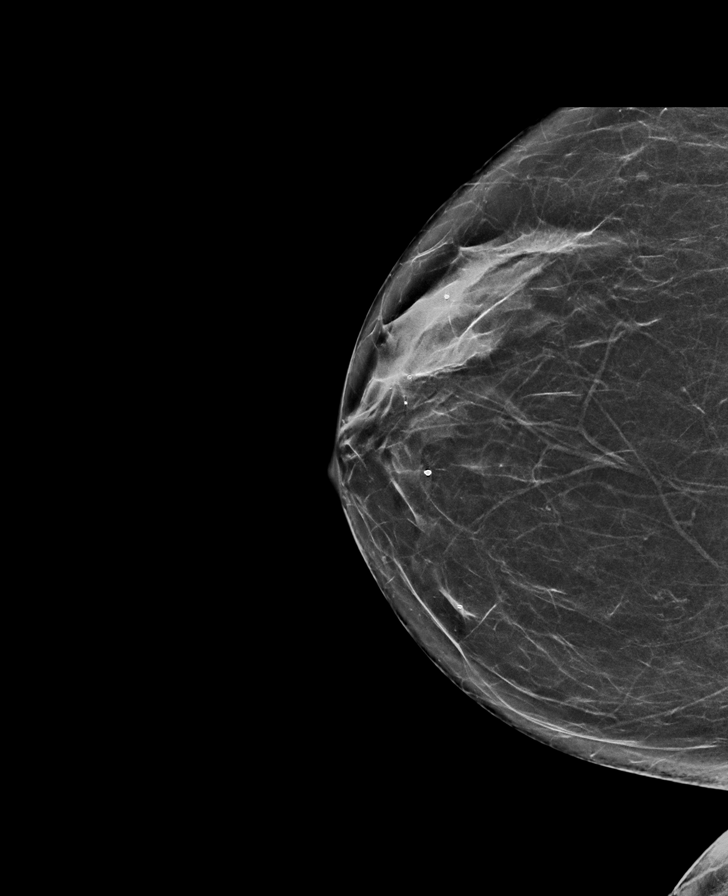

[L CC synth-2D]
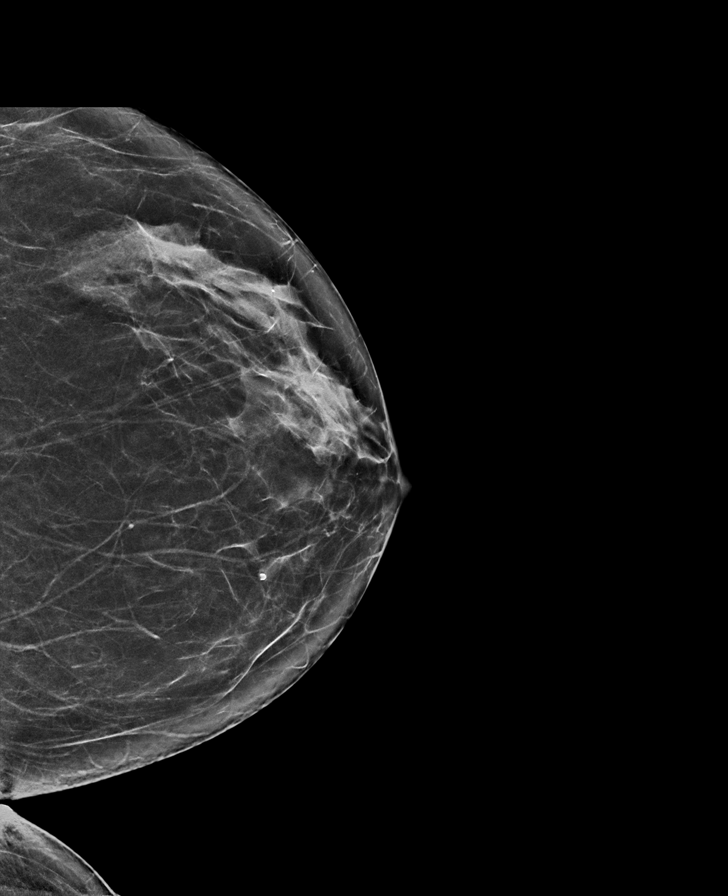

[R MLO tomo · tomo slice 39/78.0]
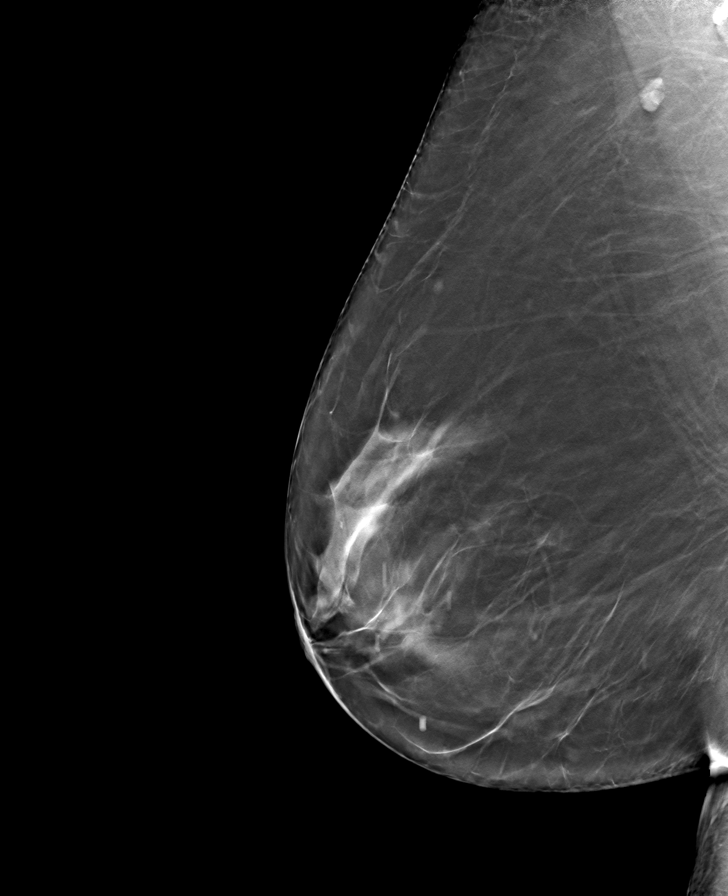

[L MLO tomo · tomo slice 39/78.0]
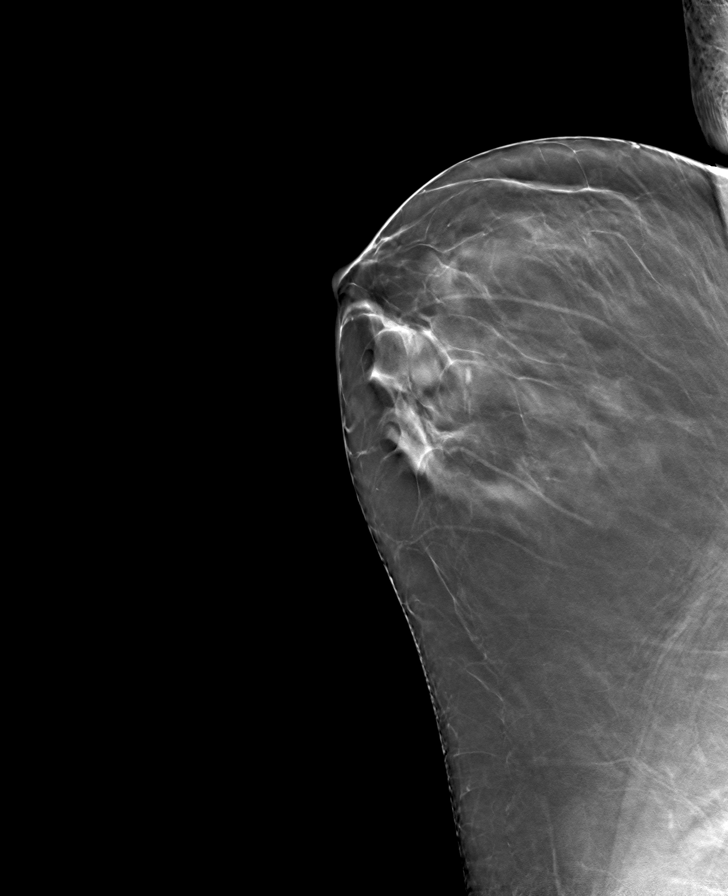

[R CC tomo · tomo slice 33/66.0]
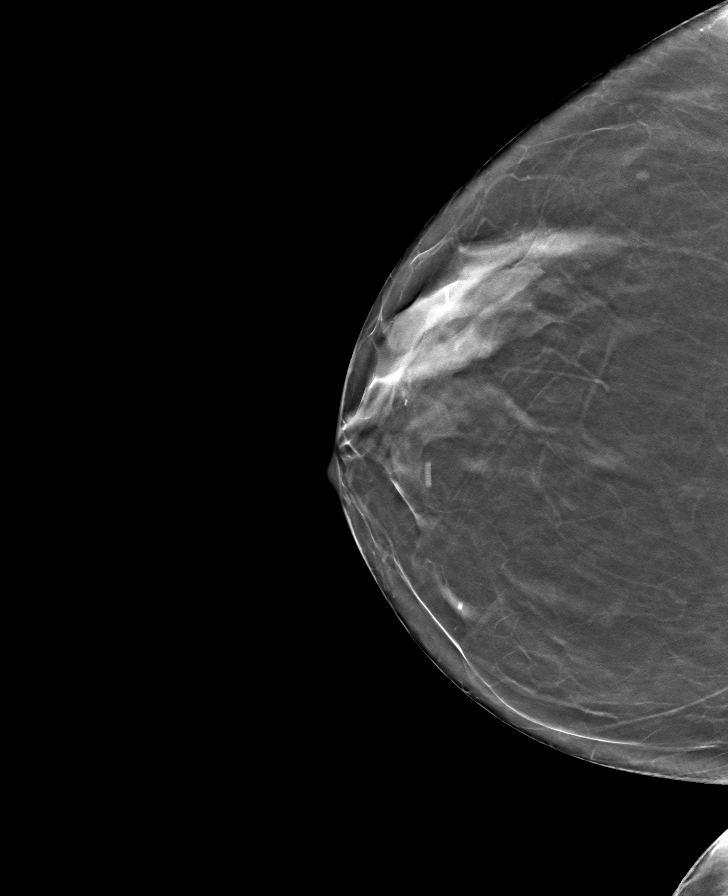

[L CC tomo · tomo slice 35/68.0]
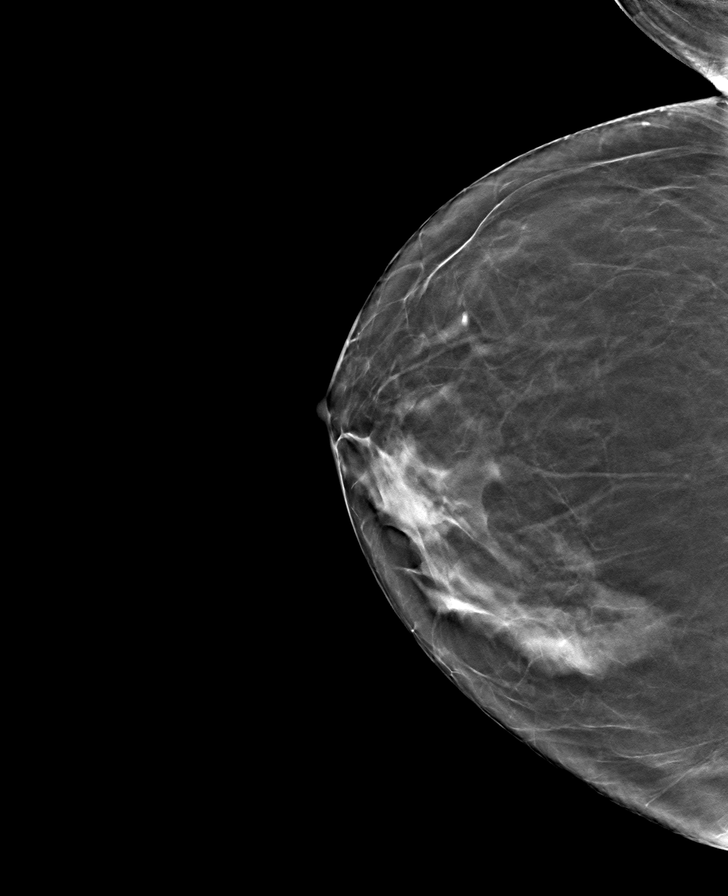

[8 of 24 positions shown; findings below may reference images not displayed]

ACR Breast Density Category b: There are scattered areas of
fibroglandular density.
FINDINGS: There are no findings suspicious for malignancy.
IMPRESSION: No mammographic evidence of malignancy. A result letter of this
screening mammogram will be mailed directly to the patient.

RECOMMENDATION:
Screening mammogram in one year. (Code:51-O-LD2)

BI-RADS CATEGORY  1: Negative.

## 2022-08-14 ENCOUNTER — Telehealth: Payer: Self-pay | Admitting: Family Medicine

## 2022-08-14 NOTE — Telephone Encounter (Signed)
Contacted Abigail Hatfield to schedule their annual wellness visit. Appointment made for 02/19/2023.  [facility] Care Guide [facility] AWV TEAM Direct Dial: [phone]

## 2022-08-18 DIAGNOSIS — Z9181 History of falling: Secondary | ICD-10-CM | POA: Diagnosis not present

## 2022-08-18 DIAGNOSIS — R4189 Other symptoms and signs involving cognitive functions and awareness: Secondary | ICD-10-CM | POA: Diagnosis not present

## 2022-08-18 DIAGNOSIS — Z87891 Personal history of nicotine dependence: Secondary | ICD-10-CM | POA: Diagnosis not present

## 2022-08-18 DIAGNOSIS — F331 Major depressive disorder, recurrent, moderate: Secondary | ICD-10-CM | POA: Diagnosis not present

## 2022-08-18 DIAGNOSIS — N183 Chronic kidney disease, stage 3 unspecified: Secondary | ICD-10-CM | POA: Diagnosis not present

## 2022-08-18 DIAGNOSIS — M5137 Other intervertebral disc degeneration, lumbosacral region: Secondary | ICD-10-CM | POA: Diagnosis not present

## 2022-08-18 DIAGNOSIS — E538 Deficiency of other specified B group vitamins: Secondary | ICD-10-CM | POA: Diagnosis not present

## 2022-08-18 DIAGNOSIS — Z90710 Acquired absence of both cervix and uterus: Secondary | ICD-10-CM | POA: Diagnosis not present

## 2022-08-18 DIAGNOSIS — I10 Essential (primary) hypertension: Secondary | ICD-10-CM | POA: Diagnosis not present

## 2022-08-18 DIAGNOSIS — G8929 Other chronic pain: Secondary | ICD-10-CM | POA: Diagnosis not present

## 2022-08-18 DIAGNOSIS — E785 Hyperlipidemia, unspecified: Secondary | ICD-10-CM | POA: Diagnosis not present

## 2022-08-18 DIAGNOSIS — D692 Other nonthrombocytopenic purpura: Secondary | ICD-10-CM | POA: Diagnosis not present
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
## 2022-08-20 ENCOUNTER — Encounter: Payer: Self-pay | Admitting: Family Medicine

## 2022-08-20 ENCOUNTER — Ambulatory Visit (INDEPENDENT_AMBULATORY_CARE_PROVIDER_SITE_OTHER): Payer: PPO | Admitting: Family Medicine

## 2022-08-20 VITALS — BP 128/72 | HR 70 | Temp 97.7°F | Resp 18 | Ht 60.0 in | Wt 186.9 lb

## 2022-08-20 DIAGNOSIS — R4189 Other symptoms and signs involving cognitive functions and awareness: Secondary | ICD-10-CM

## 2022-08-28 ENCOUNTER — Telehealth: Payer: Self-pay | Admitting: *Deleted

## 2022-08-28 NOTE — Progress Notes (Signed)
  Care Coordination   Note   08/28/2022 Name: AYLANIE GARINO MRN: [id number] DOB: [birth year]  [name] is a 77 y.o. year old female who sees Alba Cory, MD for primary care. I reached out to [name] by phone today to offer care coordination services.  Ms. Marchioni was given information about Care Coordination services today including:   The Care Coordination services include support from the care team which includes your Nurse Coordinator, Clinical Social Worker, or Pharmacist.  The Care Coordination team is here to help remove barriers to the health concerns and goals most important to you. Care Coordination services are voluntary, and the patient may decline or stop services at any time by request to their care team member.   Care Coordination Consent Status: Patient agreed to services and verbal consent obtained.   Follow up plan:  Telephone appointment with care coordination team member scheduled for:  09/01/2022  Encounter Outcome:  Pt. Scheduled from referral   Burman Nieves, [facility] Care Coordination Care Guide Direct Dial: [phone]

## 2022-09-01 ENCOUNTER — Telehealth: Payer: Self-pay | Admitting: *Deleted

## 2022-09-01 ENCOUNTER — Ambulatory Visit: Payer: Self-pay | Admitting: *Deleted

## 2022-09-01 NOTE — Patient Outreach (Signed)
  Care Coordination   09/01/2022 Name: Abigail Hatfield MRN: [id number] DOB: November 09, 1945   Care Coordination Outreach Attempts:  An unsuccessful telephone outreach was attempted for a scheduled appointment today.  Follow Up Plan:  Additional outreach attempts will be made to offer the patient care coordination information and services.   Encounter Outcome:  No Answer   Care Coordination Interventions:  No, not indicated    Malu Pellegrini, LCSW Clinical Social Worker  [facility] Care Management [phone]

## 2022-09-01 NOTE — Patient Outreach (Signed)
  Care Coordination   Initial Visit Note   09/01/2022 Name: Abigail Hatfield MRN: [id number] DOB: 03/21/1946  Abigail Hatfield is a 77 y.o. year old female who sees Abigail Cory, MD for primary care. I spoke with  Abigail Hatfield daughter Abigail Hatfield by phone today.  What matters to the patients health and wellness today?  Asset protection-patient's daughter requesting options to protect patent's finances from exploitation due to patient's cognitive limits.   Goals Addressed             This Visit's Progress    Supportive resources       Activities and task to complete in order to accomplish goals.   Consult with an Elder Sales executive re: Financial/Durable POA(resources provided) Consider referral for a Psychiatric Evaluation         SDOH assessments and interventions completed:  Yes  SDOH Interventions Today    Flowsheet Row Most Recent Value  SDOH Interventions   Food Insecurity Interventions Intervention Not Indicated  Housing Interventions Intervention Not Indicated        Care Coordination Interventions:  Yes, provided  Interventions Today    Flowsheet Row Most Recent Value  Chronic Disease   Chronic disease during today's visit Other  [cognitive impairment]  General Interventions   General Interventions Discussed/Reviewed General Interventions Discussed, Community Resources  Mental Health Interventions   Mental Health Discussed/Reviewed Mental Health Discussed, Coping Strategies, Depression, Other  [discussed symptoms of depression, lack of motivation/energy, sleeps throughout the day in her chair, increased feelings of overwhelm and stressed regarding family conflicts and tends to shut down. recommended psych evaluation]  Safety Interventions   Safety Discussed/Reviewed Safety Discussed  Advanced Directive Interventions   Advanced Directives Discussed/Reviewed Advanced Directives Reviewed, Abigail Hatfield law attorney list provided for  consultation-discussed guardianship process for future consideration]       Follow up plan: Follow up call scheduled for 09/15/22    Encounter Outcome:  Pt. Visit Completed

## 2022-09-01 NOTE — Patient Instructions (Signed)
Visit Information  Thank you for taking time to visit with me today. Please don't hesitate to contact me if I can be of assistance to you.   Following are the goals we discussed today:   Goals Addressed             This Visit's Progress    Supportive resources       Activities and task to complete in order to accomplish goals.   Consult with an Elder Sales executive re: Financial/Durable POA(resources provided) Consider referral for a Psychiatric Evaluation         Our next appointment is by telephone on 09/15/22 at 10am  Please call the care guide team at [phone] if you need to cancel or reschedule your appointment.   If you are experiencing a Mental Health or Behavioral Health Crisis or need someone to talk to, please call the Suicide and Crisis Lifeline: 988   The patient verbalized understanding of instructions, educational materials, and care plan provided today and DECLINED offer to receive copy of patient instructions, educational materials, and care plan.   Telephone follow up appointment with care management team member scheduled for: 09/15/22 with daughter Lelon Mast, [place] Clinical Social Worker  [facility] Care Management [phone]

## 2022-09-15 ENCOUNTER — Ambulatory Visit: Payer: Self-pay | Admitting: *Deleted

## 2022-09-16 NOTE — Patient Outreach (Addendum)
  Care Coordination   Follow Up Visit Note   09/16/2022 late entry Name: Abigail Hatfield MRN: [id number] DOB: 21-Oct-1945  Abigail Hatfield is a 77 y.o. year old female who sees Alba Cory, MD for primary care. I spoke with  Abigail Hatfield daughter [name] by phone on 09/15/22.  What matters to the patients health and wellness today?  Patient's daughter has concerns related to patient's ability to manage her own finances responsibly. Discussed continued plan to assist patient with finance management. Will consider representative payee option if absolutely necessary but will continue with plan to monitor patient's accounts closely.   Goals Addressed             This Visit's Progress    Supportive resources       Activities and task to complete in order to accomplish goals.   Consider to consult with an Elder Sales executive re: Financial/Durable POA Consider referral for a Psychiatric Evaluation         SDOH assessments and interventions completed:  No     Care Coordination Interventions:  Yes, provided  Interventions Today    Flowsheet Row Most Recent Value  Chronic Disease   Chronic disease during today's visit Other  [cognitive impairment]  General Interventions   General Interventions Discussed/Reviewed General Interventions Reviewed, Communication with  Communication with --  [follow up call to patient's daughter to discuss concerns related to patient's ability to make manage her own finances. confirmed that patient's family will continue to assist patient with finances when needed]  Mental Health Interventions   Mental Health Discussed/Reviewed Mental Health Reviewed  [discussed possibility of depression affecting patient's ability to cope with family challenges. confirmed plan for daughter to continue to monitor patient's bank account and will intervene when needed. Will consider becoming payee if absolutely necessary]       Follow up plan: Follow up call scheduled  for 09/23/22    Encounter Outcome:  Pt. Visit Completed

## 2022-09-16 NOTE — Patient Instructions (Signed)
Visit Information  Thank you for taking time to visit with me today. Please don't hesitate to contact me if I can be of assistance to you.   Following are the goals we discussed today:  Patient's daughter to continue to assist patient with managing finances  Our next appointment is by telephone on 09/23/22 at 2pm  Please call the care guide team at [phone] if you need to cancel or reschedule your appointment.   If you are experiencing a Mental Health or Behavioral Health Crisis or need someone to talk to, please call 911   Patient verbalizes understanding of instructions and care plan provided today and agrees to view in MyChart. Active MyChart status and patient understanding of how to access instructions and care plan via MyChart confirmed with patient.     Telephone follow up appointment with care management team member scheduled for: 09/23/22  Verna Czech, LCSW Clinical Social Worker  [facility] Care Management [phone]

## 2022-09-19 ENCOUNTER — Encounter: Payer: Self-pay | Admitting: Family Medicine

## 2022-09-23 ENCOUNTER — Ambulatory Visit: Payer: Self-pay | Admitting: *Deleted

## 2022-09-24 ENCOUNTER — Ambulatory Visit: Payer: PPO | Admitting: Family Medicine

## 2022-09-24 NOTE — Patient Outreach (Signed)
  Care Coordination   Follow Up Visit Note   09/24/2022 Late Entry Name: Abigail Hatfield MRN: [id number] DOB: 08-11-1945  Abigail Hatfield is a 77 y.o. year old female who sees Alba Cory, MD for primary care. I spoke with  Abigail Hatfield by phone on 09/23/22.  What matters to the patients health and wellness today?  Phone call to patient to assess for community resource needs. Patient requested a call at another time    Goals Addressed             This Visit's Progress    Supportive resources       Activities and task to complete in order to accomplish goals.   Discussed plan to re-schedule phone call to discuss care coordination needs          SDOH assessments and interventions completed:  No     Care Coordination Interventions:  Yes, provided  Interventions Today    Flowsheet Row Most Recent Value  Chronic Disease   Chronic disease during today's visit Other  [cognitive impaiment]  General Interventions   General Interventions Discussed/Reviewed Communication with  [patient to discuss follow up related to family conflicts. Patient was unable to speak freely at the time of the call, discussed need to re-schedule]       Follow up plan: Follow up call scheduled for 10/01/22    Encounter Outcome:  Pt. Visit Completed

## 2022-09-25 NOTE — Progress Notes (Addendum)
Name: Abigail Hatfield   MRN: [id number]    DOB: 06-Nov-1945   Date:09/26/2022       Progress Note  Subjective  Chief Complaint  Follow Up  Patient came in with Abigail Hatfield , oldest daughter  HPI  Dysthymia: she was very depressed earlier 2022 She was given zoloft and symptoms got worse, crying, unable to make decisions. Daughter was really worried. We changed to lexapro and added Wellbutrin . And she improved, however she has been hanging out with her sister more often and is spending a lot of money and is causing a lot of distress to the family. She does not seem like her normal self when around her sister. She is also having worsening of cognition   Cognitive Dysfunction:  She is seeing Dr. Malvin Hatfield for memory decline, had normal MSS and SLUMS  17/30 and likely secondary to stress, advised to continue taking anti-depressants however daughter is very concerned about her mother being a victim of phone scam multiple times since Dec 2023. Patient does not want to only have cash/allowance. Over the past month her mother has used $7000 of her account, her husband's and her mother's account. She forgets given her sister or other scammers . Daughter is going to see a lawyer next week - explained she will have to be deemed incompetent . The accounts are no longer accessible to her . Patient states today that she is okay having her daughter, Abigail Hatfield be her guardian    CKI stage III: last GFR improved. We will recheck it next visit    Senile purpura: worse on arms. Reassurance given    Hyperlipidemia: she is taking simvastatin 20 mg and denies side effects , we will recheck it next visit    Chronic back pain: she sees a chiropractor, has daily pain, from cervical spine to lumbar spine, uses tens units prn to control symptoms. She is not sure if taking gabapentin daily, daughter states sitting more than usual and getting weak, we will place referral to PT    B12 deficiency: continue supplementation, denies fatigue,  or paresthesia . Last level at goal   Patient Active Problem List   Diagnosis Date Noted   Cognitive impairment 09/24/2021   Dysthymia 09/05/2020   Hyperglycemia 09/05/2020   Low serum vitamin B12 09/05/2020   Chronic bilateral low back pain without sciatica 09/05/2020   Obesity (BMI 30.0-34.9) 12/07/2017   DDD (degenerative disc disease), lumbosacral 12/07/2017   Senile purpura (HCC) 12/07/2017   Hyperlipidemia 12/21/2014    Past Surgical History:  Procedure Laterality Date   ABDOMINAL HYSTERECTOMY  1980   CATARACT EXTRACTION W/PHACO Right 06/24/2017   Procedure: CATARACT EXTRACTION PHACO AND INTRAOCULAR LENS PLACEMENT (IOC);  Surgeon: Galen Manila, MD;  Location: [facility] ORS;  Service: Ophthalmology;  Laterality: Right;  [place] 00:48.0 AP% 16.5 CDE 7.92 Fluid Pack Lot # [id number]   CATARACT EXTRACTION W/PHACO Left 07/14/2017   Procedure: CATARACT EXTRACTION PHACO AND INTRAOCULAR LENS PLACEMENT (IOC);  Surgeon: Galen Manila, MD;  Location: [facility] ORS;  Service: Ophthalmology;  Laterality: Left;  [place] 00:36.0 AP% 16.9 CDE 6.08 Fluid Pack Lot # [id number]   CESAREAN SECTION  1967   CESAREAN SECTION  1971   CHOLECYSTECTOMY  1972   COLONOSCOPY WITH PROPOFOL N/A 12/24/2017   Procedure: COLONOSCOPY WITH PROPOFOL;  Surgeon: Wyline Mood, MD;  Location: [facility];  Service: Gastroenterology;  Laterality: N/A;   EYE SURGERY     retal fistula repair     Dr. Evette Cristal x 2  Family History  Problem Relation Age of Onset   Diabetes Mother    Hypertension Mother    Heart attack Mother    Heart failure Father    Heart disease Father    Diabetes Brother    Hypertension Brother    COPD Brother    Diabetes Sister    Breast cancer Neg Hx     Social History   Tobacco Use   Smoking status: Former    Packs/day: 0.50    Years: 5.00    Additional pack years: 0.00    Total pack years: 2.50    Types: Cigarettes    Quit date: 12/07/1988    Years since quitting: 33.8   Smokeless tobacco:  Never   Tobacco comments:    smoking cessation materials not required  Substance Use Topics   Alcohol use: No    Alcohol/week: 0.0 standard drinks of alcohol     Current Outpatient Medications:    Cholecalciferol (VITAMIN D3) 50 MCG (2000 UT) capsule, Take 1 capsule (2,000 Units total) by mouth daily., Disp: 90 capsule, Rfl: 1   cyanocobalamin (VITAMIN B12) 1000 MCG tablet, Take 1 tablet (1,000 mcg total) by mouth daily., Disp: 90 tablet, Rfl: 1   gabapentin (NEURONTIN) 100 MG capsule, Take 200 mg by mouth 2 (two) times daily., Disp: , Rfl:    PARoxetine (PAXIL) 20 MG tablet, Take 20 mg by mouth daily., Disp: , Rfl:    simvastatin (ZOCOR) 20 MG tablet, Take 1 tablet (20 mg total) by mouth at bedtime., Disp: 90 tablet, Rfl: 1  Allergies  Allergen Reactions   Moxifloxacin Hcl In Nacl Swelling   Prednisone Other (See Comments)    Paranoid   Penicillins Rash and Other (See Comments)    Has patient had a PCN reaction causing immediate rash, facial/tongue/throat swelling, SOB or lightheadedness with hypotension: Unknown Has patient had a PCN reaction causing severe rash involving mucus membranes or skin necrosis: No Has patient had a PCN reaction that required hospitalization: Yes Has patient had a PCN reaction occurring within the last 10 years: No If all of the above answers are "NO", then may proceed with Cephalosporin use.     I personally reviewed active problem list, medication list, allergies, family history, social history, health maintenance with the patient/caregiver today.   ROS  Ten systems reviewed and is negative except as mentioned in HPI   Objective  Vitals:   09/26/22 1003  BP: 118/70  Pulse: 81  Resp: 16  SpO2: 96%  Weight: 188 lb (85.3 kg)  Height: 5' (1.524 m)    Body mass index is 36.72 kg/m.  Physical Exam  Constitutional: Patient appears well-developed and well-nourished. Obese  No distress.  HEENT: head atraumatic, normocephalic, pupils equal  and reactive to light, neck supple Cardiovascular: Normal rate, regular rhythm and normal heart sounds.  No murmur heard. No BLE edema. Pulmonary/Chest: Effort normal and breath sounds normal. No respiratory distress. Abdominal: Soft.  There is no tenderness. Psychiatric: Patient has a normal mood and affect. behavior is normal. Judgment and thought content normal.   Recent Results (from the past 2160 hour(s))  TSH     Status: None   Collection Time: 07/02/22  3:11 PM  Result Value Ref Range   TSH 2.08 0.40 - 4.50 mIU/L  B12 and Folate Panel     Status: None   Collection Time: 07/02/22  3:11 PM  Result Value Ref Range   Vitamin B-12 1,060 200 - 1,100 pg/mL  Folate 13.0 ng/mL    Comment:                            Reference Range                            Low:           <3.4                            Borderline:    3.4-5.4                            Normal:        >5.4 .   VITAMIN D 25 Hydroxy (Vit-D Deficiency, Fractures)     Status: None   Collection Time: 07/02/22  3:11 PM  Result Value Ref Range   Vit D, 25-Hydroxy 40 30 - 100 ng/mL    Comment: Vitamin D Status         25-OH Vitamin D: . Deficiency:                    <20 ng/mL Insufficiency:             20 - 29 ng/mL Optimal:                 > or = 30 ng/mL . For 25-OH Vitamin D testing on patients on  D2-supplementation and patients for whom quantitation  of D2 and D3 fractions is required, the [employer]ureD(TM) 25-OH VIT D, (D2,D3), LC/MS/MS is recommended: order  code 16109 (patients >95yrs). . See Note 1 . Note 1 . For additional information, please refer to  http://education.Quest[facility].com/faq/FAQ199  (This link is being provided for informational/ educational purposes only.)   CBC with Differential/Platelet     Status: None   Collection Time: 07/02/22  3:11 PM  Result Value Ref Range   WBC 6.1 3.8 - 10.8 Thousand/uL   RBC 4.01 3.80 - 5.10 Million/uL   Hemoglobin 12.4 11.7 - 15.5 g/dL   HCT 60.4  54.0 - 98.1 %   MCV 93.8 80.0 - 100.0 fL   MCH 30.9 27.0 - 33.0 pg   MCHC 33.0 32.0 - 36.0 g/dL   RDW 19.1 47.8 - 29.5 %   Platelets 313 140 - 400 Thousand/uL   MPV 10.9 7.5 - 12.5 fL   Neutro Abs 2,763 1,500 - 7,800 cells/uL   Lymphs Abs 2,544 850 - 3,900 cells/uL   Absolute Monocytes 421 200 - 950 cells/uL   Eosinophils Absolute 281 15 - 500 cells/uL   Basophils Absolute 92 0 - 200 cells/uL   Neutrophils Relative % 45.3 %   Total Lymphocyte 41.7 %   Monocytes Relative 6.9 %   Eosinophils Relative 4.6 %   Basophils Relative 1.5 %  COMPLETE METABOLIC PANEL WITH GFR     Status: Abnormal   Collection Time: 07/02/22  3:11 PM  Result Value Ref Range   Glucose, Bld 101 (H) 65 - 99 mg/dL    Comment: .            Fasting reference interval . For someone without known diabetes, a glucose value between 100 and 125 mg/dL is consistent with prediabetes and should be confirmed with a follow-up test. .    BUN 20 7 - 25 mg/dL  Creat 0.93 0.60 - 1.00 mg/dL   eGFR 63 > OR = 60 LO/VFI/4.33I9   BUN/Creatinine Ratio SEE NOTE: 6 - 22 (calc)    Comment:    Not Reported: BUN and Creatinine are within    reference range. .    Sodium 143 135 - 146 mmol/L   Potassium 5.0 3.5 - 5.3 mmol/L   Chloride 105 98 - 110 mmol/L   CO2 29 20 - 32 mmol/L   Calcium 9.3 8.6 - 10.4 mg/dL   Total Protein 6.5 6.1 - 8.1 g/dL   Albumin 4.1 3.6 - 5.1 g/dL   Globulin 2.4 1.9 - 3.7 g/dL (calc)   AG Ratio 1.7 1.0 - 2.5 (calc)   Total Bilirubin 0.2 0.2 - 1.2 mg/dL   Alkaline phosphatase (APISO) 46 37 - 153 U/L   AST 19 10 - 35 U/L   ALT 14 6 - 29 U/L    PHQ2/9:    09/26/2022   10:12 AM 08/20/2022   11:23 AM 07/02/2022    2:21 PM 03/25/2022    9:04 AM 02/13/2022    9:37 AM  Depression screen PHQ 2/9  Decreased Interest 1 1 1  0 0  Down, Depressed, Hopeless 0 1 0 0 0  PHQ - 2 Score 1 2 1  0 0  Altered sleeping 0 1 0 0   Tired, decreased energy 1 1 0 0   Change in appetite 0 0 0 0   Feeling bad or failure  about yourself  0 1 0 0   Trouble concentrating 1 1 3 1    Moving slowly or fidgety/restless 0 1 0 0   Suicidal thoughts 0 0 0 0   PHQ-9 Score 3 7 4 1    Difficult doing work/chores  Not difficult at all  Not difficult at all     phq 9 is positive   Fall Risk:    09/26/2022   10:02 AM 08/20/2022   11:16 AM 07/02/2022    2:21 PM 03/25/2022    9:03 AM 02/13/2022    9:40 AM  Fall Risk   Falls in the past year? 1 1 1  0 0  Number falls in past yr: 0 0 0    Injury with Fall? 0 0 0    Risk for fall due to : No Fall Risks History of fall(s) No Fall Risks No Fall Risks   Follow up Falls prevention discussed Falls prevention discussed;Education provided;Falls evaluation completed Falls prevention discussed Falls prevention discussed;Education provided;Falls evaluation completed Falls prevention discussed;Education provided      Functional Status Survey: Is the patient deaf or have difficulty hearing?: No Does the patient have difficulty seeing, even when wearing glasses/contacts?: No Does the patient have difficulty concentrating, remembering, or making decisions?: Yes Does the patient have difficulty walking or climbing stairs?: Yes Does the patient have difficulty dressing or bathing?: No Does the patient have difficulty doing errands alone such as visiting a doctor's office or shopping?: No    Assessment & Plan  1. Senile purpura (HCC)  Worse on arms   2. Chronic kidney insufficiency, stage 3 (moderate) (HCC)  We will continue to monitor   3. Dysthymia  It may be secondary to cognitive dysfunction possibly dementia with behavioral changes. She needs to see neurologist but daughter wants to switch to Dr. Sherryll Burger, she will call [facility] clinic and if unable to switch we will send her to [facility]   4. Low serum vitamin B12  Taking supplements  5. Pure hypercholesterolemia  6. Cognitive and behavioral changes  Needs to see neurologist

## 2022-09-26 ENCOUNTER — Encounter: Payer: Self-pay | Admitting: Family Medicine

## 2022-09-26 ENCOUNTER — Ambulatory Visit (INDEPENDENT_AMBULATORY_CARE_PROVIDER_SITE_OTHER): Payer: PPO | Admitting: Family Medicine

## 2022-09-26 VITALS — BP 118/70 | HR 81 | Resp 16 | Ht 60.0 in | Wt 188.0 lb

## 2022-09-26 DIAGNOSIS — N183 Chronic kidney disease, stage 3 unspecified: Secondary | ICD-10-CM | POA: Diagnosis not present

## 2022-09-26 DIAGNOSIS — F341 Dysthymic disorder: Secondary | ICD-10-CM | POA: Diagnosis not present

## 2022-09-26 DIAGNOSIS — D692 Other nonthrombocytopenic purpura: Secondary | ICD-10-CM | POA: Diagnosis not present

## 2022-09-26 DIAGNOSIS — E7800 Pure hypercholesterolemia, unspecified: Secondary | ICD-10-CM

## 2022-09-26 DIAGNOSIS — R4189 Other symptoms and signs involving cognitive functions and awareness: Secondary | ICD-10-CM

## 2022-09-26 DIAGNOSIS — E538 Deficiency of other specified B group vitamins: Secondary | ICD-10-CM

## 2022-09-26 DIAGNOSIS — R4689 Other symptoms and signs involving appearance and behavior: Secondary | ICD-10-CM | POA: Diagnosis not present

## 2022-10-01 ENCOUNTER — Ambulatory Visit: Payer: Self-pay | Admitting: *Deleted

## 2022-10-01 NOTE — Patient Instructions (Signed)
Visit Information  Thank you for taking time to visit with me today. Please don't hesitate to contact me if I can be of assistance to you.   Following are the goals we discussed today:    Our next appointment is by telephone on 10/15/22 at 1pm  Please call the care guide team at [phone] if you need to cancel or reschedule your appointment.   If you are experiencing a Mental Health or Behavioral Health Crisis or need someone to talk to, please call 911   Patient verbalizes understanding of instructions and care plan provided today and agrees to view in MyChart. Active MyChart status and patient understanding of how to access instructions and care plan via MyChart confirmed with patient.     Telephone follow up appointment with care management team member scheduled for: 10/15/22  Verna Czech, LCSW Clinical Social Worker  [facility] Care Management [phone]

## 2022-10-01 NOTE — Patient Outreach (Signed)
  Care Coordination   Follow Up Visit Note   10/01/2022 Name: Abigail Hatfield MRN: [id number] DOB: Aug 03, 1945  Abigail Hatfield is a 77 y.o. year old female who sees Alba Cory, MD for primary care. I spoke with  Abigail Hatfield and her daughter by phone today.  What matters to the patients health and wellness today?  Patient's daughter discussed plan to file for guardianship due to patient's inability to manage her finances. Patient's daughter discussed patient being scammed frequently. Patient agreeable to allow her daughter to manage her finances.  Additional community resources provide to assist with in home care, including referral to [employer], companion care services and support to assist with patient's daily needs. Referral to be completed.   Goals Addressed             This Visit's Progress    Supportive resources       Interventions Today    Flowsheet Row Most Recent Value  Chronic Disease   Chronic disease during today's visit Other  [cognitive impairment]  General Interventions   General Interventions Discussed/Reviewed General Interventions Reviewed, [employer], Communication with  [discussed referral to "[employer]" through [facility] for in home assistance]  Communication with --  [patient's daughter discussed continued issues with patient's ability to manage her finances independently, daughter will file for guardianship, appt scheduled with attorney on 10/02/22]              SDOH assessments and interventions completed:  No     Care Coordination Interventions:  Yes, provided   Follow up plan: Follow up call scheduled for 10/15/22    Encounter Outcome:  Pt. Visit Completed

## 2022-10-02 ENCOUNTER — Telehealth: Payer: Self-pay | Admitting: *Deleted

## 2022-10-02 NOTE — Patient Outreach (Signed)
  Care Coordination   Follow Up Visit Note   10/02/2022 Name: BRENN EVANGELISTA MRN: [id number] DOB: 1946-03-12  [name] is a 77 y.o. year old female who sees Alba Cory, MD for primary care. I  spoke with patient's insurance company  What matters to the patients health and wellness today?  Referral companion care services and support to assist with patient's daily needs.     Goals Addressed             This Visit's Progress    Supportive resources       Interventions Today    Flowsheet Row Most Recent Value  Chronic Disease   Chronic disease during today's visit Other  [cognitive impairment]  General Interventions   General Interventions Discussed/Reviewed Communication with  Communication with --  [[facility], referral initciated for the [employer] companion program]              SDOH assessments and interventions completed:  No     Care Coordination Interventions:  Yes, provided   Follow up plan: Follow up call scheduled for 10/15/22    Encounter Outcome:  Pt. Visit Completed

## 2022-10-15 ENCOUNTER — Ambulatory Visit: Payer: Self-pay | Admitting: *Deleted

## 2022-10-15 DIAGNOSIS — Z8659 Personal history of other mental and behavioral disorders: Secondary | ICD-10-CM | POA: Diagnosis not present

## 2022-10-15 DIAGNOSIS — F0390 Unspecified dementia without behavioral disturbance: Secondary | ICD-10-CM | POA: Diagnosis not present

## 2022-10-15 DIAGNOSIS — F411 Generalized anxiety disorder: Secondary | ICD-10-CM | POA: Diagnosis not present

## 2022-10-15 NOTE — Patient Outreach (Addendum)
  Care Coordination   Follow Up Visit Note   10/15/2022 Name: Abigail Hatfield MRN: [id number] DOB: 20-Aug-1945  Abigail Hatfield is a 77 y.o. year old female who sees Abigail Cory, MD for primary care. I spoke with  Abigail Hatfield daughter Abigail Hatfield by phone today.  What matters to the patients health and wellness today?  Management of patient's financial affairs and in home care support.   Goals Addressed             This Visit's Progress    Supportive resources       Interventions Today    Flowsheet Row Most Recent Value  Chronic Disease   Chronic disease during today's visit Other  [cognitive impairment]  General Interventions   General Interventions Discussed/Reviewed General Interventions Reviewed, [employer], Doctor Visits, Level of Care  Doctor Visits Discussed/Reviewed Specialist  [F/U with Neurologist scheduld for today for memory testing to determine mental competency to handle her affairs-patient's daughter agreeable to managing patient's affairs and care needs]  Level of Care Personal Care Services  [confirmed that patient has not been contacted by [employer] companion program-CSW to follow up with referral]  Advanced Directive Interventions   Advanced Directives Discussed/Reviewed Guardianship  Guardianship --  [patient's daughter continues to seek legal advice from an attorney regarding the manaagement of patient's affairs]              SDOH assessments and interventions completed:  No     Care Coordination Interventions:  Yes, provided   Follow up plan: Follow up call scheduled for 10/29/22    Encounter Outcome:  Pt. Visit Completed

## 2022-10-15 NOTE — Patient Instructions (Addendum)
Visit Information  Thank you for taking time to visit with me today. Please don't hesitate to contact me if I can be of assistance to you.   Following are the goals we discussed today:  CSW to follow up on referral for [employer] in home companion program-message left for a return call Patient's daughter to continue follow up with their attorney and neurologist regarding the management of patient's affairs   Our next appointment is by telephone on 10/29/22 at 11am  Please call the care guide team at [phone] if you need to cancel or reschedule your appointment.   If you are experiencing a Mental Health or Behavioral Health Crisis or need someone to talk to, please call 911   Patient verbalizes understanding of instructions and care plan provided today and agrees to view in MyChart. Active MyChart status and patient understanding of how to access instructions and care plan via MyChart confirmed with patient.     Telephone follow up appointment with care management team member scheduled for: 10/29/22   Verna Czech, LCSW Clinical Social Worker  [facility] Care Management [phone]

## 2022-10-22 ENCOUNTER — Encounter: Payer: Self-pay | Admitting: *Deleted

## 2022-10-22 NOTE — Patient Outreach (Signed)
  Care Coordination   Collaboration  Visit Note   10/22/2022 Name: Abigail Hatfield MRN: [id number] DOB: October 13, 1945  Abigail Hatfield is a 77 y.o. year old female who sees Alba Cory, MD for primary care. I  contacted the [employer] program to confirm benefits  What matters to the patients health and wellness today?  In home assistance    Goals Addressed             This Visit's Progress    Supportive resources       Interventions Today    Flowsheet Row Most Recent Value  Chronic Disease   Chronic disease during today's visit Other  [cognitive impairment]  General Interventions   General Interventions Discussed/Reviewed Communication with  Communication with --  [[employer] in home companion program, confirmed that pt is eligibile for benefit through her ins. co. , authorized user on her account will ned to call to activate her profile to begin services, VM  left for pt's daughter re: the above [phone]              SDOH assessments and interventions completed:  No     Care Coordination Interventions:  Yes, provided   Follow up plan: Follow up call scheduled for 10/29/22    Encounter Outcome:  Pt. Visit Completed

## 2022-10-29 ENCOUNTER — Telehealth: Payer: Self-pay | Admitting: *Deleted

## 2022-10-29 ENCOUNTER — Encounter: Payer: Self-pay | Admitting: *Deleted

## 2022-10-29 NOTE — Patient Outreach (Signed)
  Care Coordination   10/29/2022 Name: Abigail Hatfield MRN: [id number] DOB: 01/22/1946   Care Coordination Outreach Attempts:  Phone call to patient's daughter to follow up on referral for [facility].  Follow Up Plan:  Additional outreach attempts will be made to offer the patient care coordination information and services.   Encounter Outcome:  No Answer   Care Coordination Interventions:  No, not indicated    Lavayah Vita, LCSW Clinical Social Worker  [facility] Care Management [phone]

## 2022-11-02 ENCOUNTER — Encounter: Payer: Self-pay | Admitting: Family Medicine

## 2022-11-06 ENCOUNTER — Other Ambulatory Visit: Payer: Self-pay | Admitting: Family Medicine

## 2022-11-06 DIAGNOSIS — E7800 Pure hypercholesterolemia, unspecified: Secondary | ICD-10-CM

## 2022-11-27 ENCOUNTER — Other Ambulatory Visit: Payer: Self-pay | Admitting: Student

## 2022-11-27 DIAGNOSIS — F411 Generalized anxiety disorder: Secondary | ICD-10-CM | POA: Diagnosis not present

## 2022-11-27 DIAGNOSIS — F0390 Unspecified dementia without behavioral disturbance: Secondary | ICD-10-CM | POA: Diagnosis not present

## 2022-11-27 DIAGNOSIS — Z8659 Personal history of other mental and behavioral disorders: Secondary | ICD-10-CM | POA: Diagnosis not present

## 2022-11-27 DIAGNOSIS — R413 Other amnesia: Secondary | ICD-10-CM

## 2022-12-04 ENCOUNTER — Telehealth: Payer: Self-pay | Admitting: *Deleted

## 2022-12-04 NOTE — Patient Outreach (Signed)
  Care Coordination   12/04/2022 Name: Abigail Hatfield MRN: [id number] DOB: 1945-10-22   Care Coordination Outreach Attempts:  An unsuccessful telephone outreach was attempted today to offer the patient information about available care coordination services.  Follow Up Plan:  Additional outreach attempts will be made to offer the patient care coordination information and services.   Encounter Outcome:  No Answer   Care Coordination Interventions:  No, not indicated    Verna Czech, LCSW Clinical Social Worker  [phone]

## 2022-12-19 ENCOUNTER — Other Ambulatory Visit: Payer: PPO

## 2023-01-06 ENCOUNTER — Other Ambulatory Visit: Payer: PPO

## 2023-01-16 DIAGNOSIS — H35373 Puckering of macula, bilateral: Secondary | ICD-10-CM | POA: Diagnosis not present

## 2023-01-16 DIAGNOSIS — H43813 Vitreous degeneration, bilateral: Secondary | ICD-10-CM | POA: Diagnosis not present

## 2023-01-20 DIAGNOSIS — F0390 Unspecified dementia without behavioral disturbance: Secondary | ICD-10-CM | POA: Diagnosis not present

## 2023-01-20 DIAGNOSIS — M810 Age-related osteoporosis without current pathological fracture: Secondary | ICD-10-CM | POA: Diagnosis not present

## 2023-01-20 DIAGNOSIS — G4700 Insomnia, unspecified: Secondary | ICD-10-CM | POA: Diagnosis not present

## 2023-01-20 DIAGNOSIS — F3342 Major depressive disorder, recurrent, in full remission: Secondary | ICD-10-CM | POA: Diagnosis not present

## 2023-01-20 DIAGNOSIS — Z87891 Personal history of nicotine dependence: Secondary | ICD-10-CM | POA: Diagnosis not present

## 2023-01-20 DIAGNOSIS — E785 Hyperlipidemia, unspecified: Secondary | ICD-10-CM | POA: Diagnosis not present

## 2023-01-20 DIAGNOSIS — F411 Generalized anxiety disorder: Secondary | ICD-10-CM | POA: Diagnosis not present

## 2023-02-19 ENCOUNTER — Ambulatory Visit: Payer: PPO

## 2023-02-19 DIAGNOSIS — Z0000 Encounter for general adult medical examination without abnormal findings: Secondary | ICD-10-CM

## 2023-02-19 NOTE — Progress Notes (Signed)
Subjective:   Abigail Hatfield is a 77 y.o. female who presents for Medicare Annual (Subsequent) preventive examination.  Visit Complete: Virtual I connected with  [name] on 02/19/23 by a audio enabled telemedicine application and verified that I am speaking with the correct person using two identifiers.  Patient Location: Home  Provider Location: Office/Clinic  I discussed the limitations of evaluation and management by telemedicine. The patient expressed understanding and agreed to proceed.  Vital Signs: Because this visit was a virtual/telehealth visit, some criteria may be missing or patient reported. Any vitals not documented were not able to be obtained and vitals that have been documented are patient reported. Cardiac Risk Factors include: advanced age (>24men, >9 women);dyslipidemia;obesity (BMI >30kg/m2);sedentary lifestyle     Objective:    There were no vitals filed for this visit. There is no height or weight on file to calculate BMI.     02/19/2023   10:55 AM 02/13/2022    9:40 AM 11/14/2021    9:38 PM 02/12/2021   10:15 AM 02/09/2020   10:59 AM 12/07/2018    2:29 PM 06/06/2018    5:35 PM  Advanced Directives  Does Patient Have a Medical Advance Directive? No No No Yes Yes Yes No  Type of Aeronautical engineer of [place];Living will Healthcare Power of [place];Living will Healthcare Power of [place];Living will   Copy of Healthcare Power of Attorney in Chart?    No - copy requested No - copy requested No - copy requested   Would patient like information on creating a medical advance directive? No - Patient declined No - Patient declined     No - Patient declined    Current Medications (verified) Outpatient Encounter Medications as of 02/19/2023  Medication Sig   Cholecalciferol (VITAMIN D3) 50 MCG (2000 UT) capsule Take 1 capsule (2,000 Units total) by mouth daily.   cyanocobalamin (VITAMIN B12) 1000 MCG tablet Take 1 tablet (1,000 mcg  total) by mouth daily.   donepezil (ARICEPT) 5 MG tablet Take 5 mg by mouth at bedtime.   gabapentin (NEURONTIN) 100 MG capsule Take 200 mg by mouth 2 (two) times daily.   PARoxetine (PAXIL) 20 MG tablet Take 20 mg by mouth daily.   pyridOXINE (VITAMIN B6) 100 MG tablet Take 100 mg by mouth daily.   simvastatin (ZOCOR) 20 MG tablet TAKE 1 TABLET BY MOUTH AT BEDTIME   No facility-administered encounter medications on file as of 02/19/2023.    Allergies (verified) Moxifloxacin hcl in nacl, Prednisone, and Penicillins   History: Past Medical History:  Diagnosis Date   Allergy    Cataract    Hyperlipidemia    Medical history non-contributory    Past Surgical History:  Procedure Laterality Date   ABDOMINAL HYSTERECTOMY  1980   CATARACT EXTRACTION W/PHACO Right 06/24/2017   Procedure: CATARACT EXTRACTION PHACO AND INTRAOCULAR LENS PLACEMENT (IOC);  Surgeon: Galen Manila, MD;  Location: [facility] ORS;  Service: Ophthalmology;  Laterality: Right;  [place] 00:48.0 AP% 16.5 CDE 7.92 Fluid Pack Lot # [id number]   CATARACT EXTRACTION W/PHACO Left 07/14/2017   Procedure: CATARACT EXTRACTION PHACO AND INTRAOCULAR LENS PLACEMENT (IOC);  Surgeon: Galen Manila, MD;  Location: [facility] ORS;  Service: Ophthalmology;  Laterality: Left;  [place] 00:36.0 AP% 16.9 CDE 6.08 Fluid Pack Lot # [id number]   CESAREAN SECTION  1967   CESAREAN SECTION  1971   CHOLECYSTECTOMY  1972   COLONOSCOPY WITH PROPOFOL N/A 12/24/2017   Procedure: COLONOSCOPY WITH PROPOFOL;  Surgeon: Wyline Mood, MD;  Location[facility] ENDOSCOPY;  Service: Gastroenterology;  Laterality: N/A;   EYE SURGERY     retal fistula repair     Dr. Evette Cristal x 2   Family History  Problem Relation Age of Onset   Diabetes Mother    Hypertension Mother    Heart attack Mother    Heart failure Father    Heart disease Father    Diabetes Brother    Hypertension Brother    COPD Brother    Diabetes Sister    Breast cancer Neg Hx    Social History    Socioeconomic History   Marital status: Married    Spouse name: Ramon Dredge   Number of children: 2   Years of education: some college   Highest education level: 12th grade  Occupational History   Occupation: Retired    Comment: since 04/2008  Tobacco Use   Smoking status: Former    Current packs/day: 0.00    Average packs/day: 0.5 packs/day for 5.0 years (2.5 ttl pk-yrs)    Types: Cigarettes    Start date: 12/08/1983    Quit date: 12/07/1988    Years since quitting: 34.2   Smokeless tobacco: Never   Tobacco comments:    smoking cessation materials not required  Vaping Use   Vaping status: Never Used  Substance and Sexual Activity   Alcohol use: No    Alcohol/week: 0.0 standard drinks of alcohol   Drug use: No   Sexual activity: Not Currently    Partners: Male    Birth control/protection: None    Comment: patient's husband has prostate issues so she cannot with him. has not in 72yrs.  Other Topics Concern   Not on file  Social History Narrative   Mother is 65 yrs old that has dementia, staying with her now      Husband has prostate issues and early dementia, he has been losing his patience and it has been stressful       Patient has 7 deteriorated disc   Social Determinants of Health   Financial Resource Strain: Low Risk  (02/19/2023)   Overall Financial Resource Strain (CARDIA)    Difficulty of Paying Living Expenses: Not hard at all  Food Insecurity: No Food Insecurity (02/19/2023)   Hunger Vital Sign    Worried About Running Out of Food in the Last Year: Never true    Ran Out of Food in the Last Year: Never true  Transportation Needs: No Transportation Needs (02/19/2023)   PRAPARE - Administrator, Civil Service (Medical): No    Lack of Transportation (Non-Medical): No  Physical Activity: Inactive (02/19/2023)   Exercise Vital Sign    Days of Exercise per Week: 0 days    Minutes of Exercise per Session: 0 min  Stress: No Stress Concern Present  (02/19/2023)   [employer] of Occupational Health - Occupational Stress Questionnaire    Feeling of Stress : Only a little  Social Connections: Moderately Integrated (02/19/2023)   Social Connection and Isolation Panel [NHANES]    Frequency of Communication with Friends and Family: More than three times a week    Frequency of Social Gatherings with Friends and Family: Three times a week    Attends Religious Services: More than 4 times per year    Active Member of Clubs or Organizations: No    Attends Banker Meetings: Never    Marital Status: Married    Tobacco Counseling Counseling given: Not Answered  Tobacco comments: smoking cessation materials not required   Clinical Intake:  Pre-visit preparation completed: Yes  Pain : No/denies pain     BMI - recorded: 36.7 Nutritional Status: BMI > 30  Obese Nutritional Risks: None Diabetes: No  How often do you need to have someone help you when you read instructions, pamphlets, or other written materials from your doctor or pharmacy?: 1 - Never  Interpreter Needed?: No  Information entered by :: Kennedy Bucker, LPN   Activities of Daily Living    02/19/2023   10:56 AM 09/26/2022   10:02 AM  In your present state of health, do you have any difficulty performing the following activities:  Hearing? 0 0  Vision? 0 0  Difficulty concentrating or making decisions? 0 1  Walking or climbing stairs? 0 1  Dressing or bathing? 0 0  Doing errands, shopping? 0 0  Preparing Food and eating ? N   Using the Toilet? N   In the past six months, have you accidently leaked urine? N   Do you have problems with loss of bowel control? N   Managing your Medications? N   Managing your Finances? N   Housekeeping or managing your Housekeeping? N     Patient Care Team: Alba Cory, MD as PCP - General (Family Medicine) Galen Manila, MD as Consulting Physician (Ophthalmology) Deirdre Evener, MD as Consulting  Physician (Dermatology) Andee Poles, [facility] (Inactive) (Pharmacist) [place], [place], [place] as Social Worker  Indicate any recent Medical Services you may have received from other than [facility] providers in the past year (date may be approximate).     Assessment:   This is a routine wellness examination for [employer].  Hearing/Vision screen Hearing Screening - Comments:: No aids Vision Screening - Comments:: Readers- Dr.Porfilio   Goals Addressed             This Visit's Progress    DIET - EAT MORE FRUITS AND VEGETABLES         Depression Screen    02/19/2023   10:53 AM 09/26/2022   10:12 AM 08/20/2022   11:23 AM 07/02/2022    2:21 PM 03/25/2022    9:04 AM 02/13/2022    9:37 AM 09/24/2021    8:58 AM  PHQ 2/9 Scores  PHQ - 2 Score 0 1 2 1  0 0 0  PHQ- 9 Score 0 3 7 4 1   0    Fall Risk    02/19/2023   10:55 AM 09/26/2022   10:02 AM 08/20/2022   11:16 AM 07/02/2022    2:21 PM 03/25/2022    9:03 AM  Fall Risk   Falls in the past year? 0 1 1 1  0  Number falls in past yr: 0 0 0 0   Injury with Fall? 0 0 0 0   Risk for fall due to : No Fall Risks No Fall Risks History of fall(s) No Fall Risks No Fall Risks  Follow up Falls prevention discussed;Falls evaluation completed Falls prevention discussed Falls prevention discussed;Education provided;Falls evaluation completed Falls prevention discussed Falls prevention discussed;Education provided;Falls evaluation completed    MEDICARE RISK AT HOME: Medicare Risk at Home Any stairs in or around the home?: Yes If so, are there any without handrails?: No Home free of loose throw rugs in walkways, pet beds, electrical cords, etc?: Yes Adequate lighting in your home to reduce risk of falls?: Yes Life alert?: No Use of a cane, Poth or w/c?: No Grab bars in the  bathroom?: No Shower chair or bench in shower?: Yes Elevated toilet seat or a handicapped toilet?: No  TIMED UP AND GO:  Was the test performed?  No    Cognitive  Function:    07/02/2022    2:23 PM  MMSE - Mini Mental State Exam  Orientation to time 5  Orientation to Place 5  Registration 3  Attention/ Calculation 5  Recall 3  Language- name 2 objects 2  Language- repeat 1  Language- follow 3 step command 3  Language- read & follow direction 1  Write a sentence 1  Copy design 1  Total score 30        02/19/2023   10:57 AM 02/13/2022    9:41 AM 12/07/2018    2:43 PM 12/08/2017    2:23 PM  6CIT Screen  What Year? 0 points 0 points 0 points 0 points  What month? 0 points 0 points 0 points 0 points  What time? 3 points 0 points 0 points 0 points  Count back from 20 0 points 0 points 0 points 0 points  Months in reverse 0 points 0 points 0 points 0 points  Repeat phrase 0 points 0 points 0 points 0 points  Total Score 3 points 0 points 0 points 0 points    Immunizations There is no immunization history for the selected administration types on file for this patient.  TDAP status: Due, Education has been provided regarding the importance of this vaccine. Advised may receive this vaccine at local pharmacy or Health Dept. Aware to provide a copy of the vaccination record if obtained from local pharmacy or Health Dept. Verbalized acceptance and understanding.  Flu Vaccine status: Declined, Education has been provided regarding the importance of this vaccine but patient still declined. Advised may receive this vaccine at local pharmacy or Health Dept. Aware to provide a copy of the vaccination record if obtained from local pharmacy or Health Dept. Verbalized acceptance and understanding.  Pneumococcal vaccine status: Declined,  Education has been provided regarding the importance of this vaccine but patient still declined. Advised may receive this vaccine at local pharmacy or Health Dept. Aware to provide a copy of the vaccination record if obtained from local pharmacy or Health Dept. Verbalized acceptance and understanding.   Covid-19 vaccine  status: Declined, Education has been provided regarding the importance of this vaccine but patient still declined. Advised may receive this vaccine at local pharmacy or Health Dept.or vaccine clinic. Aware to provide a copy of the vaccination record if obtained from local pharmacy or Health Dept. Verbalized acceptance and understanding.  Qualifies for Shingles Vaccine? Yes   Zostavax completed No   Shingrix Completed?: No.    Education has been provided regarding the importance of this vaccine. Patient has been advised to call insurance company to determine out of pocket expense if they have not yet received this vaccine. Advised may also receive vaccine at local pharmacy or Health Dept. Verbalized acceptance and understanding.  Screening Tests Health Maintenance  Topic Date Due   COVID-19 Vaccine (1) Never done   Zoster Vaccines- Shingrix (1 of 2) Never done   INFLUENZA VACCINE  Never done   Colonoscopy  12/25/2022   Pneumonia Vaccine 35+ Years old (1 of 1 - PCV) 03/25/2023 (Originally 12/02/2009)   MAMMOGRAM  04/02/2023   Medicare Annual Wellness (AWV)  02/19/2024   DEXA SCAN  Completed   Hepatitis C Screening  Completed   HPV VACCINES  Aged Out   DTaP/Tdap/Td  Discontinued    Health Maintenance  Health Maintenance Due  Topic Date Due   COVID-19 Vaccine (1) Never done   Zoster Vaccines- Shingrix (1 of 2) Never done   INFLUENZA VACCINE  Never done   Colonoscopy  12/25/2022    Declined referral for colonoscopy  Mammogram status: Completed 04/01/22. Repeat every year  Bone Density status: Completed 04/01/21. Results reflect: Bone density results: NORMAL. Repeat every 5 years.  Lung Cancer Screening: (Low Dose CT Chest recommended if Age 94-80 years, 20 pack-year currently smoking OR have quit w/in 15years.) does not qualify.    Additional Screening:  Hepatitis C Screening: does qualify; Completed 11/26/16  Vision Screening: Recommended annual ophthalmology exams for early  detection of glaucoma and other disorders of the eye. Is the patient up to date with their annual eye exam?  Yes  Who is the provider or what is the name of the office in which the patient attends annual eye exams? Dr.Porfilio If pt is not established with a provider, would they like to be referred to a provider to establish care? No .   Dental Screening: Recommended annual dental exams for proper oral hygiene   Community Resource Referral / Chronic Care Management: CRR required this visit?  No   CCM required this visit?  No     Plan:     I have personally reviewed and noted the following in the patient's chart:   Medical and social history Use of alcohol, tobacco or illicit drugs  Current medications and supplements including opioid prescriptions. Patient is not currently taking opioid prescriptions. Functional ability and status Nutritional status Physical activity Advanced directives List of other physicians Hospitalizations, surgeries, and ER visits in previous 12 months Vitals Screenings to include cognitive, depression, and falls Referrals and appointments  In addition, I have reviewed and discussed with patient certain preventive protocols, quality metrics, and best practice recommendations. A written personalized care plan for preventive services as well as general preventive health recommendations were provided to patient.     Hal Hope, LPN   72/08/[id number]   After Visit Summary: (MyChart) Due to this being a telephonic visit, the after visit summary with patients personalized plan was offered to patient via MyChart   Nurse Notes: none

## 2023-02-19 NOTE — Patient Instructions (Addendum)
Abigail Hatfield , Thank you for taking time to come for your Medicare Wellness Visit. I appreciate your ongoing commitment to your health goals. Please review the following plan we discussed and let me know if I can assist you in the future.   Referrals/Orders/Follow-Ups/Clinician Recommendations: none  This is a list of the screening recommended for you and due dates:  Health Maintenance  Topic Date Due   COVID-19 Vaccine (1) Never done   Zoster (Shingles) Vaccine (1 of 2) Never done   Flu Shot  Never done   Colon Cancer Screening  12/25/2022   Pneumonia Vaccine (1 of 1 - PCV) 03/25/2023*   Mammogram  04/02/2023   Medicare Annual Wellness Visit  02/19/2024   DEXA scan (bone density measurement)  Completed   Hepatitis C Screening  Completed   HPV Vaccine  Aged Out   DTaP/Tdap/Td vaccine  Discontinued  *Topic was postponed. The date shown is not the original due date.    Advanced directives: (ACP Link)Information on Advanced Care Planning can be found at [facility] of [facility] Directives Advance Health Care Directives ([email or web address]   Next Medicare Annual Wellness Visit scheduled for next year: Yes    02/25/24 @ 11:35 am by phone

## 2023-02-20 ENCOUNTER — Telehealth: Payer: Self-pay | Admitting: Family Medicine

## 2023-02-20 NOTE — Telephone Encounter (Signed)
Copied from CRM [id number]. Topic: General - Other >> Feb 20, 2023  9:22 AM Macon Large wrote: Reason for CRM: Pt daughter Albin Felling requests call back to discuss concerns about pt memory. Cb# [phone]

## 2023-04-23 ENCOUNTER — Other Ambulatory Visit: Payer: Self-pay | Admitting: Student

## 2023-04-23 DIAGNOSIS — R413 Other amnesia: Secondary | ICD-10-CM

## 2023-05-01 ENCOUNTER — Ambulatory Visit
Admission: RE | Admit: 2023-05-01 | Discharge: 2023-05-01 | Disposition: A | Discharge status: Home / Self Care | Payer: PPO | Source: Ambulatory Visit | Attending: Student | Admitting: Student

## 2023-05-01 DIAGNOSIS — R413 Other amnesia: Secondary | ICD-10-CM | POA: Diagnosis present

## 2023-05-11 ENCOUNTER — Other Ambulatory Visit: Payer: Self-pay | Admitting: Family Medicine

## 2023-05-11 DIAGNOSIS — E7800 Pure hypercholesterolemia, unspecified: Secondary | ICD-10-CM

## 2023-05-11 NOTE — Telephone Encounter (Signed)
Lvm to schedule ap appt for followup

## 2023-06-18 ENCOUNTER — Encounter: Payer: Self-pay | Admitting: Family Medicine

## 2023-06-18 ENCOUNTER — Other Ambulatory Visit: Payer: Self-pay | Admitting: Family Medicine

## 2023-06-18 ENCOUNTER — Encounter: Payer: Self-pay | Admitting: Internal Medicine

## 2023-06-18 DIAGNOSIS — Z1231 Encounter for screening mammogram for malignant neoplasm of breast: Secondary | ICD-10-CM

## 2023-06-26 ENCOUNTER — Ambulatory Visit
Admission: RE | Admit: 2023-06-26 | Discharge: 2023-06-26 | Disposition: A | Discharge status: Home / Self Care | Source: Ambulatory Visit | Attending: Family Medicine | Admitting: Family Medicine

## 2023-06-26 DIAGNOSIS — Z1231 Encounter for screening mammogram for malignant neoplasm of breast: Secondary | ICD-10-CM | POA: Diagnosis present

## 2023-07-25 ENCOUNTER — Emergency Department

## 2023-07-25 ENCOUNTER — Observation Stay
Admission: EM | Admit: 2023-07-25 | Discharge: 2023-07-26 | Disposition: A | Discharge status: Home / Self Care | Attending: Internal Medicine | Admitting: Internal Medicine

## 2023-07-25 ENCOUNTER — Other Ambulatory Visit: Payer: Self-pay

## 2023-07-25 ENCOUNTER — Encounter: Payer: Self-pay | Admitting: Intensive Care

## 2023-07-25 DIAGNOSIS — M545 Low back pain, unspecified: Secondary | ICD-10-CM | POA: Insufficient documentation

## 2023-07-25 DIAGNOSIS — N183 Chronic kidney disease, stage 3 unspecified: Secondary | ICD-10-CM | POA: Insufficient documentation

## 2023-07-25 DIAGNOSIS — R519 Headache, unspecified: Secondary | ICD-10-CM | POA: Insufficient documentation

## 2023-07-25 DIAGNOSIS — H538 Other visual disturbances: Secondary | ICD-10-CM | POA: Insufficient documentation

## 2023-07-25 DIAGNOSIS — U071 COVID-19: Secondary | ICD-10-CM | POA: Diagnosis not present

## 2023-07-25 DIAGNOSIS — H532 Diplopia: Secondary | ICD-10-CM | POA: Insufficient documentation

## 2023-07-25 DIAGNOSIS — G3184 Mild cognitive impairment, so stated: Secondary | ICD-10-CM | POA: Insufficient documentation

## 2023-07-25 DIAGNOSIS — Z1231 Encounter for screening mammogram for malignant neoplasm of breast: Secondary | ICD-10-CM | POA: Insufficient documentation

## 2023-07-25 DIAGNOSIS — Z87891 Personal history of nicotine dependence: Secondary | ICD-10-CM | POA: Insufficient documentation

## 2023-07-25 DIAGNOSIS — F341 Dysthymic disorder: Secondary | ICD-10-CM | POA: Diagnosis not present

## 2023-07-25 DIAGNOSIS — Z79899 Other long term (current) drug therapy: Secondary | ICD-10-CM | POA: Insufficient documentation

## 2023-07-25 DIAGNOSIS — R531 Weakness: Principal | ICD-10-CM | POA: Insufficient documentation

## 2023-07-25 DIAGNOSIS — J101 Influenza due to other identified influenza virus with other respiratory manifestations: Secondary | ICD-10-CM | POA: Insufficient documentation

## 2023-07-25 DIAGNOSIS — R2681 Unsteadiness on feet: Secondary | ICD-10-CM | POA: Diagnosis not present

## 2023-07-25 DIAGNOSIS — R4189 Other symptoms and signs involving cognitive functions and awareness: Secondary | ICD-10-CM | POA: Diagnosis present

## 2023-07-25 DIAGNOSIS — G8929 Other chronic pain: Secondary | ICD-10-CM | POA: Diagnosis present

## 2023-07-25 HISTORY — DX: Depression, unspecified: F32.A

## 2023-07-25 HISTORY — DX: Anxiety disorder, unspecified: F41.9

## 2023-07-25 LAB — COMPREHENSIVE METABOLIC PANEL WITH GFR
ALT: 18 U/L (ref 0–44)
AST: 26 U/L (ref 15–41)
Albumin: 3.5 g/dL (ref 3.5–5.0)
Alkaline Phosphatase: 35 U/L — ABNORMAL LOW (ref 38–126)
Anion gap: 8 (ref 5–15)
BUN: 15 mg/dL (ref 8–23)
CO2: 28 mmol/L (ref 22–32)
Calcium: 8.9 mg/dL (ref 8.9–10.3)
Chloride: 101 mmol/L (ref 98–111)
Creatinine, Ser: 1.04 mg/dL — ABNORMAL HIGH (ref 0.44–1.00)
GFR, Estimated: 55 mL/min — ABNORMAL LOW (ref >60)
Glucose, Bld: 96 mg/dL (ref 70–99)
Potassium: 3.9 mmol/L (ref 3.5–5.1)
Sodium: 137 mmol/L (ref 135–145)
Total Bilirubin: 0.5 mg/dL (ref 0.0–1.2)
Total Protein: 7 g/dL (ref 6.5–8.1)

## 2023-07-25 LAB — URINALYSIS, ROUTINE W REFLEX MICROSCOPIC
Bacteria, UA: NONE SEEN
Bilirubin Urine: NEGATIVE
Glucose, UA: NEGATIVE mg/dL
Ketones, ur: NEGATIVE mg/dL
Nitrite: NEGATIVE
Protein, ur: NEGATIVE mg/dL
Specific Gravity, Urine: 1.012 (ref 1.005–1.030)
pH: 7 (ref 5.0–8.0)

## 2023-07-25 LAB — CBC
HCT: 37.8 % (ref 36.0–46.0)
Hemoglobin: 12.4 g/dL (ref 12.0–15.0)
MCH: 30.1 pg (ref 26.0–34.0)
MCHC: 32.8 g/dL (ref 30.0–36.0)
MCV: 91.7 fL (ref 80.0–100.0)
Platelets: 295 10*3/uL (ref 150–400)
RBC: 4.12 MIL/uL (ref 3.87–5.11)
RDW: 14 % (ref 11.5–15.5)
WBC: 5.9 10*3/uL (ref 4.0–10.5)
nRBC: 0 % (ref 0.0–0.2)

## 2023-07-25 LAB — PROCALCITONIN: Procalcitonin: <0.1 ng/mL

## 2023-07-25 LAB — RESP PANEL BY RT-PCR (RSV, FLU A&B, COVID)  RVPGX2
Influenza A by PCR: NEGATIVE
Influenza B by PCR: NEGATIVE
Resp Syncytial Virus by PCR: NEGATIVE
SARS Coronavirus 2 by RT PCR: POSITIVE — AB

## 2023-07-25 LAB — TROPONIN I (HIGH SENSITIVITY): Troponin I (High Sensitivity): 9 ng/L (ref <18)

## 2023-07-25 MED ORDER — ACETAMINOPHEN 500 MG PO TABS
1000.0000 mg | ORAL_TABLET | Freq: Once | ORAL | Status: AC
  Administered 2023-07-25: 1000 mg via ORAL
  Filled 2023-07-25: qty 2

## 2023-07-25 MED ORDER — ONDANSETRON HCL 4 MG/2ML IJ SOLN: 4.0000 mg | Freq: Four times a day (QID) | INTRAMUSCULAR | Status: DC | PRN

## 2023-07-25 MED ORDER — GABAPENTIN 100 MG PO CAPS
200.0000 mg | ORAL_CAPSULE | Freq: Two times a day (BID) | ORAL | Status: DC
  Administered 2023-07-25 – 2023-07-26 (×2): 200 mg via ORAL
  Filled 2023-07-25 (×2): qty 2

## 2023-07-25 MED ORDER — ONDANSETRON HCL 4 MG PO TABS: 4.0000 mg | ORAL_TABLET | Freq: Four times a day (QID) | ORAL | Status: DC | PRN

## 2023-07-25 MED ORDER — ALBUTEROL SULFATE (2.5 MG/3ML) 0.083% IN NEBU: 2.5000 mg | INHALATION_SOLUTION | RESPIRATORY_TRACT | Status: DC | PRN

## 2023-07-25 MED ORDER — MORPHINE SULFATE (PF) 2 MG/ML IV SOLN: 2.0000 mg | INTRAVENOUS | Status: DC | PRN

## 2023-07-25 MED ORDER — DONEPEZIL HCL 5 MG PO TABS
5.0000 mg | ORAL_TABLET | Freq: Every day | ORAL | Status: DC
  Administered 2023-07-25: 5 mg via ORAL
  Filled 2023-07-25: qty 1

## 2023-07-25 MED ORDER — ACETAMINOPHEN 650 MG RE SUPP
650.0000 mg | Freq: Four times a day (QID) | RECTAL | Status: DC | PRN
Start: 2023-07-25 — End: 2023-07-26

## 2023-07-25 MED ORDER — HYDROCODONE-ACETAMINOPHEN 5-325 MG PO TABS: 1.0000 | ORAL_TABLET | ORAL | Status: DC | PRN

## 2023-07-25 MED ORDER — ENOXAPARIN SODIUM 40 MG/0.4ML IJ SOSY
0.5000 mg/kg | PREFILLED_SYRINGE | INTRAMUSCULAR | Status: DC
  Administered 2023-07-25: 40 mg via SUBCUTANEOUS
  Filled 2023-07-25: qty 0.4

## 2023-07-25 MED ORDER — PAROXETINE HCL 20 MG PO TABS
20.0000 mg | ORAL_TABLET | Freq: Every day | ORAL | Status: DC
  Administered 2023-07-26: 20 mg via ORAL
  Filled 2023-07-25: qty 1

## 2023-07-25 MED ORDER — SODIUM CHLORIDE 0.9 % IV BOLUS
1000.0000 mL | Freq: Once | INTRAVENOUS | Status: AC
  Administered 2023-07-25: 1000 mL via INTRAVENOUS

## 2023-07-25 MED ORDER — ACETAMINOPHEN 325 MG PO TABS: 650.0000 mg | ORAL_TABLET | Freq: Four times a day (QID) | ORAL | Status: DC | PRN

## 2023-07-25 MED ORDER — SIMVASTATIN 10 MG PO TABS
20.0000 mg | ORAL_TABLET | Freq: Every day | ORAL | Status: DC
  Administered 2023-07-25: 20 mg via ORAL
  Filled 2023-07-25: qty 2

## 2023-07-25 MED ORDER — GUAIFENESIN 100 MG/5ML PO LIQD: 5.0000 mL | ORAL | Status: DC | PRN

## 2023-07-25 NOTE — Assessment & Plan Note (Signed)
 Continue paroxetine.

## 2023-07-25 NOTE — Assessment & Plan Note (Signed)
 Delirium precautions

## 2023-07-25 NOTE — Assessment & Plan Note (Signed)
 MRI/MRA with no acute findings.   Headache likely related to COVID infection

## 2023-07-25 NOTE — H&P (Signed)
 History and Physical    Patient: Abigail Hatfield ZOX:[id number] DOB: 12/16/1945 DOA: 07/25/2023 DOS: the patient was seen and examined on 07/25/2023 PCP: Alba Cory, MD  Patient coming from: Home  Chief Complaint:  Chief Complaint  Patient presents with   Blurred Vision   Headache    HPI: Abigail Hatfield is a 78 y.o. female with medical history significant for Cognitive impairment, depression, CKD 3, HLD and chronic back pain being admitted with COVID infection symptomatic control generalized weakness and headache.  For the past day she has been too weak to stand or ambulate.  No cough, fever or chills and no vomiting or diarrhea. ED course and data review: BP 157/66 with otherwise normal vitals Labs including CBC, CMP, troponin, procalcitonin and urinalysis all unremarkable COVID-positive EKG with NSR and nonspecific T wave abnormalities MRI/MRA brain unremarkable Chest x-ray with patchy left lung base infiltrate Patient given Tylenol and an NS bolus Observation requested    Past Medical History:  Diagnosis Date   Allergy    Anxiety    Cataract    Depression    Hyperlipidemia    Medical history non-contributory    Past Surgical History:  Procedure Laterality Date   ABDOMINAL HYSTERECTOMY  1980   CATARACT EXTRACTION W/PHACO Right 06/24/2017   Procedure: CATARACT EXTRACTION PHACO AND INTRAOCULAR LENS PLACEMENT (IOC);  Surgeon: Galen Manila, MD;  Location: [facility] ORS;  Service: Ophthalmology;  Laterality: Right;  [place] 00:48.0 AP% 16.5 CDE 7.92 Fluid Pack Lot # [id number]   CATARACT EXTRACTION W/PHACO Left 07/14/2017   Procedure: CATARACT EXTRACTION PHACO AND INTRAOCULAR LENS PLACEMENT (IOC);  Surgeon: Galen Manila, MD;  Location: [facility] ORS;  Service: Ophthalmology;  Laterality: Left;  [place] 00:36.0 AP% 16.9 CDE 6.08 Fluid Pack Lot # [id number]   CESAREAN SECTION  1967   CESAREAN SECTION  1971   CHOLECYSTECTOMY  1972   COLONOSCOPY WITH PROPOFOL N/A 12/24/2017    Procedure: COLONOSCOPY WITH PROPOFOL;  Surgeon: Wyline Mood, MD;  Location: [facility];  Service: Gastroenterology;  Laterality: N/A;   EYE SURGERY     retal fistula repair     Dr. Evette Cristal x 2   Social History:  reports that she quit smoking about 34 years ago. Her smoking use included cigarettes. She started smoking about 39 years ago. She has a 2.5 pack-year smoking history. She has never used smokeless tobacco. She reports that she does not drink alcohol and does not use drugs.  Allergies  Allergen Reactions   Moxifloxacin Hcl In Nacl Swelling   Prednisone Other (See Comments)    Paranoid   Penicillins Rash and Other (See Comments)    Has patient had a PCN reaction causing immediate rash, facial/tongue/throat swelling, SOB or lightheadedness with hypotension: Unknown Has patient had a PCN reaction causing severe rash involving mucus membranes or skin necrosis: No Has patient had a PCN reaction that required hospitalization: Yes Has patient had a PCN reaction occurring within the last 10 years: No If all of the above answers are "NO", then may proceed with Cephalosporin use.     Family History  Problem Relation Age of Onset   Diabetes Mother    Hypertension Mother    Heart attack Mother    Heart failure Father    Heart disease Father    Diabetes Brother    Hypertension Brother    COPD Brother    Diabetes Sister    Breast cancer Neg Hx     Prior to Admission medications   Medication  Sig Start Date End Date Taking? Authorizing Provider  Cholecalciferol (VITAMIN D3) 50 MCG (2000 UT) capsule Take 1 capsule (2,000 Units total) by mouth daily. 03/25/22   Sowles, Krichna, MD  cyanocobalamin (VITAMIN B12) 1000 MCG tablet Take 1 tablet (1,000 mcg total) by mouth daily. 03/25/22   Sowles, Krichna, MD  donepezil (ARICEPT) 5 MG tablet Take 5 mg by mouth at bedtime.    [provider]  gabapentin (NEURONTIN) 100 MG capsule Take 200 mg by mouth 2 (two) times daily.     [provider]  PARoxetine (PAXIL) 20 MG tablet Take 20 mg by mouth daily. 07/07/22   [provider]  pyridOXINE (VITAMIN B6) 100 MG tablet Take 100 mg by mouth daily.    [provider]  simvastatin (ZOCOR) 20 MG tablet TAKE 1 TABLET BY MOUTH AT BEDTIME 11/07/22   Sowles, Krichna, MD    Physical Exam: Vitals:   07/25/23 1600 07/25/23 1710 07/25/23 2015 07/25/23 2256  BP: (!) 148/68 (!) 156/68 133/60   Pulse:  67 66   Resp: 14 12 16    Temp:  98.6 F (37 C)  98.7 F (37.1 C)  TempSrc:  Oral  Oral  SpO2:  94% 94%   Weight:      Height:       Physical Exam Vitals and nursing note reviewed.  Constitutional:      General: She is not in acute distress.    Comments: Patient in no apparent distress.  Daughter at bedside  HENT:     Head: Normocephalic and atraumatic.  Cardiovascular:     Rate and Rhythm: Normal rate and regular rhythm.     Heart sounds: Normal heart sounds.  Pulmonary:     Effort: Pulmonary effort is normal.     Breath sounds: Normal breath sounds.  Abdominal:     Palpations: Abdomen is soft.     Tenderness: There is no abdominal tenderness.  Neurological:     Mental Status: Mental status is at baseline.     Labs on Admission: I have personally reviewed following labs and imaging studies  CBC: Recent Labs  Lab 07/25/23 1430  WBC 5.9  HGB 12.4  HCT 37.8  MCV 91.7  PLT 295   Basic Metabolic Panel: Recent Labs  Lab 07/25/23 1430  NA 137  K 3.9  CL 101  CO2 28  GLUCOSE 96  BUN 15  CREATININE 1.04*  CALCIUM 8.9   GFR: Estimated Creatinine Clearance: 42.1 mL/min (A) (by C-G formula based on SCr of 1.04 mg/dL (H)). Liver Function Tests: Recent Labs  Lab 07/25/23 1430  AST 26  ALT 18  ALKPHOS 35*  BILITOT 0.5  PROT 7.0  ALBUMIN 3.5   No results for input(s): "LIPASE", "AMYLASE" in the last 168 hours. No results for input(s): "AMMONIA" in the last 168 hours. Coagulation Profile: No results for input(s): "INR",  "PROTIME" in the last 168 hours. Cardiac Enzymes: No results for input(s): "CKTOTAL", "CKMB", "CKMBINDEX", "TROPONINI" in the last 168 hours. BNP (last 3 results) No results for input(s): "PROBNP" in the last 8760 hours. HbA1C: No results for input(s): "HGBA1C" in the last 72 hours. CBG: No results for input(s): "GLUCAP" in the last 168 hours. Lipid Profile: No results for input(s): "CHOL", "HDL", "LDLCALC", "TRIG", "CHOLHDL", "LDLDIRECT" in the last 72 hours. Thyroid Function Tests: No results for input(s): "TSH", "T4TOTAL", "FREET4", "T3FREE", "THYROIDAB" in the last 72 hours. Anemia Panel: No results for input(s): "VITAMINB12", "FOLATE", "FERRITIN", "TIBC", "IRON", "RETICCTPCT" in  the last 72 hours. Urine analysis:    Component Value Date/Time   COLORURINE YELLOW (A) 07/25/2023 1430   APPEARANCEUR CLEAR (A) 07/25/2023 1430   LABSPEC 1.012 07/25/2023 1430   PHURINE 7.0 07/25/2023 1430   GLUCOSEU NEGATIVE 07/25/2023 1430   HGBUR LARGE (A) 07/25/2023 1430   BILIRUBINUR NEGATIVE 07/25/2023 1430   KETONESUR NEGATIVE 07/25/2023 1430   PROTEINUR NEGATIVE 07/25/2023 1430   NITRITE NEGATIVE 07/25/2023 1430   LEUKOCYTESUR SMALL (A) 07/25/2023 1430    Radiological Exams on Admission: MR ANGIO HEAD WO CONTRAST Result Date: 07/25/2023 CLINICAL DATA:  Initial evaluation for acute headache, blurry vision. EXAM: MRI HEAD WITHOUT CONTRAST MRA HEAD WITHOUT CONTRAST TECHNIQUE: Multiplanar, multi-echo pulse sequences of the brain and surrounding structures were acquired without intravenous contrast. Angiographic images of the Circle of Willis were acquired using MRA technique without intravenous contrast. COMPARISON:  CT from earlier the same day. FINDINGS: MRI HEAD FINDINGS Brain: Cerebral volume within normal limits. Minimal hazy FLAIR signal abnormality noted within the periventricular white matter, nonspecific, but less than is typically seen for patient age. No evidence for acute or subacute  infarct. Gray-white matter differentiation maintained. No erosive chronic cortical infarction. No visible acute or chronic intracranial blood products, although evaluation mildly limited as the SWI MIP sequence only is available for review at time of this dictation. No mass lesion, midline shift or mass effect. No hydrocephalus or extra-axial fluid collection. Pituitary gland suprasellar region within normal limits. Vascular: Major intracranial vascular flow voids are maintained. Skull and upper cervical spine: Craniocervical junction within normal limits. Bone marrow signal intensity normal. No scalp soft tissue abnormality. Sinuses/Orbits: Prior bilateral ocular lens replacement. Paranasal sinuses are largely clear. No significant mastoid effusion. Other: None. MRA HEAD FINDINGS Anterior circulation: Examination mildly degraded by motion artifact. Both internal carotid arteries are patent through the siphons without significant stenosis or other abnormality. Right A1 segment dominant and widely patent. Left A1 hypoplastic and/or absent, accounting for the mildly diminutive left ICA is compared to the right. Normal anterior communicating artery complex. Anterior cerebral arteries patent without stenosis. No M1 stenosis or occlusion. Distal MCA branches perfused and symmetric. Posterior circulation: Both V4 segments patent without stenosis. Right vertebral artery slightly dominant. Both PICA patent at their origins. Basilar patent without stenosis. Superior cerebellar and posterior cerebral arteries patent bilaterally. Anatomic variants: As above.  No intracranial aneurysm. IMPRESSION: MRI HEAD: Normal brain MRI for age.  No acute intracranial abnormality. MRA HEAD: Normal intracranial MRA. Electronically Signed   By: Virgia Griffins M.D.   On: 07/25/2023 18:27   MR BRAIN WO CONTRAST Result Date: 07/25/2023 CLINICAL DATA:  Initial evaluation for acute headache, blurry vision. EXAM: MRI HEAD WITHOUT CONTRAST  MRA HEAD WITHOUT CONTRAST TECHNIQUE: Multiplanar, multi-echo pulse sequences of the brain and surrounding structures were acquired without intravenous contrast. Angiographic images of the Circle of Willis were acquired using MRA technique without intravenous contrast. COMPARISON:  CT from earlier the same day. FINDINGS: MRI HEAD FINDINGS Brain: Cerebral volume within normal limits. Minimal hazy FLAIR signal abnormality noted within the periventricular white matter, nonspecific, but less than is typically seen for patient age. No evidence for acute or subacute infarct. Gray-white matter differentiation maintained. No erosive chronic cortical infarction. No visible acute or chronic intracranial blood products, although evaluation mildly limited as the SWI MIP sequence only is available for review at time of this dictation. No mass lesion, midline shift or mass effect. No hydrocephalus or extra-axial fluid collection. Pituitary gland suprasellar region within  normal limits. Vascular: Major intracranial vascular flow voids are maintained. Skull and upper cervical spine: Craniocervical junction within normal limits. Bone marrow signal intensity normal. No scalp soft tissue abnormality. Sinuses/Orbits: Prior bilateral ocular lens replacement. Paranasal sinuses are largely clear. No significant mastoid effusion. Other: None. MRA HEAD FINDINGS Anterior circulation: Examination mildly degraded by motion artifact. Both internal carotid arteries are patent through the siphons without significant stenosis or other abnormality. Right A1 segment dominant and widely patent. Left A1 hypoplastic and/or absent, accounting for the mildly diminutive left ICA is compared to the right. Normal anterior communicating artery complex. Anterior cerebral arteries patent without stenosis. No M1 stenosis or occlusion. Distal MCA branches perfused and symmetric. Posterior circulation: Both V4 segments patent without stenosis. Right vertebral  artery slightly dominant. Both PICA patent at their origins. Basilar patent without stenosis. Superior cerebellar and posterior cerebral arteries patent bilaterally. Anatomic variants: As above.  No intracranial aneurysm. IMPRESSION: MRI HEAD: Normal brain MRI for age.  No acute intracranial abnormality. MRA HEAD: Normal intracranial MRA. Electronically Signed   By: Virgia Griffins M.D.   On: 07/25/2023 18:27   DG Chest Port 1 View Result Date: 07/25/2023 CLINICAL DATA:  Weakness EXAM: PORTABLE CHEST 1 VIEW COMPARISON:  Chest x-ray 06/06/2018 FINDINGS: There is some minimal strandy and patchy opacities in the left lung base. Costophrenic angles are clear. There is no pneumothorax. The cardiomediastinal silhouette is within normal limits. No acute fractures are seen. IMPRESSION: Minimal strandy and patchy opacities in the left lung base, atelectasis versus infiltrate. Electronically Signed   By: Tyron Gallon M.D.   On: 07/25/2023 15:16   CT HEAD WO CONTRAST Result Date: 07/25/2023 CLINICAL DATA:  Diplopia EXAM: CT HEAD WITHOUT CONTRAST TECHNIQUE: Contiguous axial images were obtained from the base of the skull through the vertex without intravenous contrast. RADIATION DOSE REDUCTION: This exam was performed according to the departmental dose-optimization program which includes automated exposure control, adjustment of the mA and/or kV according to patient size and/or use of iterative reconstruction technique. COMPARISON:  06/06/2018. FINDINGS: Brain: There is periventricular white matter decreased attenuation consistent with small vessel ischemic changes. Ventricles, sulci and cisterns are prominent consistent with age related involutional changes. No acute intracranial hemorrhage, mass effect or shift. No hydrocephalus. Vascular: No hyperdense vessel or unexpected calcification. Skull: Normal. Negative for fracture or focal lesion. Sinuses/Orbits: No acute finding. IMPRESSION: Atrophy and chronic small  vessel ischemic changes. No acute intracranial process identified. Electronically Signed   By: Sydell Eva M.D.   On: 07/25/2023 15:03     Data Reviewed: Relevant notes from primary care and specialist visits, past discharge summaries as available in EHR, including Care Everywhere. Prior diagnostic testing as pertinent to current admission diagnoses Updated medications and problem lists for reconciliation ED course, including vitals, labs, imaging, treatment and response to treatment Triage notes, nursing and pharmacy notes and ED provider's notes Notable results as noted in HPI   Assessment and Plan: * Generalized weakness Likely secondary to COVID infection PT eval  COVID-19 virus infection Supportive care  Headache MRI/MRA with no acute findings.   Headache likely related to COVID infection  Cognitive impairment Delirium precautions  Chronic bilateral low back pain without sciatica Continue gabapentin  Dysthymia Continue paroxetine     DVT prophylaxis: Lovenox  Consults: none  Advance Care Planning: full code  Family Communication: Daughter at bedside  Disposition Plan: Back to previous home environment  Severity of Illness: The appropriate patient status for this patient is OBSERVATION. Observation  status is judged to be reasonable and necessary in order to provide the required intensity of service to ensure the patient's safety. The patient's presenting symptoms, physical exam findings, and initial radiographic and laboratory data in the context of their medical condition is felt to place them at decreased risk for further clinical deterioration. Furthermore, it is anticipated that the patient will be medically stable for discharge from the hospital within 2 midnights of admission.   Author: Lanetta Pion, MD 07/25/2023 11:00 PM  For on call review [email or web address].

## 2023-07-25 NOTE — ED Notes (Signed)
 Pt ambulated to the bathroom in the room x1 assist - pt had steady and even gait.

## 2023-07-25 NOTE — Assessment & Plan Note (Signed)
 Supportive care

## 2023-07-25 NOTE — ED Provider Notes (Signed)
 [facility] Provider Note    Event Date/Time   First MD Initiated Contact with Patient 07/25/23 1527     (approximate)   History   Blurred Vision and Headache   HPI  Abigail Hatfield is a 78 y.o. female  who presents to the emergency department today because of concern for headache, blurred vision, weakness. Symptoms started yesterday. Patient says she was lying down watching tv. Headache has continued throughout the day today. Family noticed yesterday she was having a hard time with ambulation. Family also states patient has complained of bad headache to the right side of her head in the past and has been evaluated but this seems different. Patient denies any trauma. Denies any fevers or chills. No shortness of breath or CP.        Physical Exam   Triage Vital Signs: ED Triage Vitals [07/25/23 1349]  Encounter Vitals Group     BP      Systolic BP Percentile      Diastolic BP Percentile      Pulse      Resp      Temp      Temp src      SpO2      Weight 179 lb (81.2 kg)     Height 5' (1.524 m)     Head Circumference      Peak Flow      Pain Score 7     Pain Loc      Pain Education      Exclude from Growth Chart     Most recent vital signs: There were no vitals filed for this visit.  General: Awake, alert, oriented. CV:  Good peripheral perfusion. Regular rate and rhythm. Resp:  Normal effort. Lungs clear. Abd:  No distention. Non tender. Other:  PERRL. EOMI. Face symmetric. Tongue midline. Strength 5/5 in upper and lower extremities. Sensation intact.    ED Results / Procedures / Treatments   Labs (all labs ordered are listed, but only abnormal results are displayed) Labs Reviewed  URINALYSIS, ROUTINE W REFLEX MICROSCOPIC - Abnormal; Notable for the following components:      Result Value   Color, Urine YELLOW (*)    APPearance CLEAR (*)    Hgb urine dipstick LARGE (*)    Leukocytes,Ua SMALL (*)    All other components within  normal limits  RESP PANEL BY RT-PCR (RSV, FLU A&B, COVID)  [id number]  CBC  COMPREHENSIVE METABOLIC PANEL WITH GFR  TROPONIN I (HIGH SENSITIVITY)     EKG  I, Marylynn Soho, attending physician, personally viewed and interpreted this EKG  EKG Time: 1354 Rate: 71 Rhythm: normal sinus rhythm Axis: normal Intervals: qtc 428 QRS: narrow ST changes: no st elevation Impression: normal ekg   RADIOLOGY I independently interpreted and visualized the CT head. My interpretation: No ICH Radiology interpretation:  IMPRESSION:  Atrophy and chronic small vessel ischemic changes. No acute  intracranial process identified.    I independently interpreted and visualized the CXR. My interpretation: No pneumonia Radiology interpretation:  IMPRESSION:  Minimal strandy and patchy opacities in the left lung base,  atelectasis versus infiltrate.     PROCEDURES:  Critical Care performed: No    MEDICATIONS ORDERED IN ED: Medications - No data to display   IMPRESSION / MDM / ASSESSMENT AND PLAN / ED COURSE  I reviewed the triage vital signs and the nursing notes.  Differential diagnosis includes, but is not limited to, ICH, CVA, migraine, tension headache.   Patient's presentation is most consistent with acute presentation with potential threat to life or bodily function.   The patient is on the cardiac monitor to evaluate for evidence of arrhythmia and/or significant heart rate changes.  Patient presents to the emergency department today because of concerns for headache, blurred vision, weakness.  On exam patient is awake and alert.  No focal neurodeficits.  Head CT ordered from triage without any acute abnormalities.  Blood work without any significant electrolyte abnormality, anemia or elevated white blood cell count.  UA without evidence of infection.  Chest x-ray was read as possible pneumonia however patient without any symptoms currently.  Will check  MRI will give IV fluids.    MRI without any concerning acute abnormalities. COVID did come back positive. Do think this could explain the patient's weakness. However after IV fluids patient continued to have significant weakness and was unable to ambulate safely. Discussed with Dr. Vallarie Gauze with the hospitalist service who will evaluate for admission.  FINAL CLINICAL IMPRESSION(S) / ED DIAGNOSES   Final diagnoses:  COVID-19  Weakness      Note:  This document was prepared using Dragon voice recognition software and may include unintentional dictation errors.    Marylynn Soho, MD 07/25/23 2217

## 2023-07-25 NOTE — ED Triage Notes (Signed)
 ARrives from home via [facility].  C/O mid forehead headache since last night.  Pain worse today. C/O pounding headache.   70 130/80  AAOx3.  NAD Stroke screen negative Looks pale, dizzy when standing up.

## 2023-07-25 NOTE — ED Triage Notes (Signed)
 Arrived by [facility] from home. Patient unable to stand starting yesterday due to weakness. Also c/o headache worse than ever before and reports intermittent blurry vision last night  Family report patient has history dementia and has caregiver at the home with her 24/7

## 2023-07-25 NOTE — Assessment & Plan Note (Signed)
 Continue gabapentin.

## 2023-07-25 NOTE — Assessment & Plan Note (Signed)
 Likely secondary to COVID infection PT eval

## 2023-07-26 DIAGNOSIS — R531 Weakness: Secondary | ICD-10-CM | POA: Diagnosis not present

## 2023-07-26 MED ORDER — ACETAMINOPHEN 325 MG PO TABS: 650.0000 mg | ORAL_TABLET | Freq: Four times a day (QID) | ORAL | 0 refills | Status: AC | PRN

## 2023-07-26 MED ORDER — CEPHALEXIN 500 MG PO CAPS: 500.0000 mg | ORAL_CAPSULE | Freq: Two times a day (BID) | ORAL | Status: DC

## 2023-07-26 NOTE — ED Notes (Signed)
 Spoke with Ethelle Herb, daughter, via telephone who says she will be here around 1130 to get patient.

## 2023-07-26 NOTE — ED Notes (Signed)
 Pt ambulatory independently from bed to bedside commode to have BM.

## 2023-07-26 NOTE — ED Notes (Signed)
 PT at bedside.

## 2023-07-26 NOTE — ED Notes (Signed)
 Pt ambulatory from bed to bedside commode independently. Pt instructed to use call light so that staff can disconnect monitoring wires. Pt ambulatory from commode to bed with steady gait. Bedside commode moved closer due to high likelihood patient will not remember to use call light.

## 2023-07-26 NOTE — ED Notes (Signed)
 Pt in recliner eating breakfast tray.

## 2023-07-26 NOTE — Discharge Summary (Signed)
 Physician Discharge Summary   Patient: Abigail Hatfield MRN: [id number] DOB: 03/10/1946  Admit date:     07/25/2023  Discharge date: 07/26/23  Discharge Physician: Ezzard Holms   PCP: Sowles, Krichna, MD   Recommendations at discharge:  Follow-up with PCP    Discharge Diagnoses: Generalized weakness COVID-19 virus infection Headache Cognitive impairment Chronic bilateral low back pain without sciatica Dysthymia  Hospital Course: Abigail Hatfield is a 78 y.o. female with medical history significant for Cognitive impairment, depression, CKD 3, HLD and chronic back pain being admitted with COVID infection symptomatic control generalized weakness and headache.  For the past day she has been too weak to stand or ambulate. COVID-positive EKG with NSR and nonspecific T wave abnormalities MRI/MRA brain unremarkable.  Patient walked with PT OT with no shortness of breath.  Have been counseled on isolation precaution and therefore being discharged today to follow-up with primary care physician    Consultants: None Procedures performed: None Disposition: Home Diet recommendation:  Cardiac diet DISCHARGE MEDICATION: Allergies as of 07/26/2023       Reactions   Moxifloxacin Hcl In Nacl Swelling   Prednisone Other (See Comments)   Paranoid   Penicillins Rash, Other (See Comments)   Has patient had a PCN reaction causing immediate rash, facial/tongue/throat swelling, SOB or lightheadedness with hypotension: Unknown Has patient had a PCN reaction causing severe rash involving mucus membranes or skin necrosis: No Has patient had a PCN reaction that required hospitalization: Yes Has patient had a PCN reaction occurring within the last 10 years: No If all of the above answers are "NO", then may proceed with Cephalosporin use.        Medication List     TAKE these medications    acetaminophen 325 MG tablet Commonly known as: TYLENOL Take 2 tablets (650 mg total) by mouth every 6  (six) hours as needed for mild pain (pain score 1-3) (or Fever >/= 101).   cyanocobalamin 1000 MCG tablet Commonly known as: VITAMIN B12 Take 1 tablet (1,000 mcg total) by mouth daily.   donepezil 5 MG tablet Commonly known as: ARICEPT Take 5 mg by mouth at bedtime.   gabapentin 100 MG capsule Commonly known as: NEURONTIN Take 200 mg by mouth 2 (two) times daily.   PARoxetine 20 MG tablet Commonly known as: PAXIL Take 20 mg by mouth daily.   pyridOXINE 100 MG tablet Commonly known as: VITAMIN B6 Take 100 mg by mouth daily.   simvastatin 20 MG tablet Commonly known as: ZOCOR TAKE 1 TABLET BY MOUTH AT BEDTIME   Vitamin D3 50 MCG (2000 UT) capsule Take 1 capsule (2,000 Units total) by mouth daily.        Discharge Exam: Filed Weights   07/25/23 1349  Weight: 81.2 kg   Vitals and nursing note reviewed.  Constitutional: Laying in bed in no acute distress HENT:     Head: Normocephalic and atraumatic.  Cardiovascular:     Rate and Rhythm: Normal rate and regular rhythm.     Heart sounds: Normal heart sounds.  Pulmonary:     Effort: Pulmonary effort is normal.     Breath sounds: Normal breath sounds.  Abdominal:     Palpations: Abdomen is soft.     Tenderness: There is no abdominal tenderness.  Neurological:     Mental Status: Mental status is at baseline.     Condition at discharge: good  The results of significant diagnostics from this hospitalization (including imaging, microbiology, ancillary and  laboratory) are listed below for reference.   Imaging Studies: MR ANGIO HEAD WO CONTRAST Result Date: 07/25/2023 CLINICAL DATA:  Initial evaluation for acute headache, blurry vision. EXAM: MRI HEAD WITHOUT CONTRAST MRA HEAD WITHOUT CONTRAST TECHNIQUE: Multiplanar, multi-echo pulse sequences of the brain and surrounding structures were acquired without intravenous contrast. Angiographic images of the Circle of Willis were acquired using MRA technique without  intravenous contrast. COMPARISON:  CT from earlier the same day. FINDINGS: MRI HEAD FINDINGS Brain: Cerebral volume within normal limits. Minimal hazy FLAIR signal abnormality noted within the periventricular white matter, nonspecific, but less than is typically seen for patient age. No evidence for acute or subacute infarct. Gray-white matter differentiation maintained. No erosive chronic cortical infarction. No visible acute or chronic intracranial blood products, although evaluation mildly limited as the SWI MIP sequence only is available for review at time of this dictation. No mass lesion, midline shift or mass effect. No hydrocephalus or extra-axial fluid collection. Pituitary gland suprasellar region within normal limits. Vascular: Major intracranial vascular flow voids are maintained. Skull and upper cervical spine: Craniocervical junction within normal limits. Bone marrow signal intensity normal. No scalp soft tissue abnormality. Sinuses/Orbits: Prior bilateral ocular lens replacement. Paranasal sinuses are largely clear. No significant mastoid effusion. Other: None. MRA HEAD FINDINGS Anterior circulation: Examination mildly degraded by motion artifact. Both internal carotid arteries are patent through the siphons without significant stenosis or other abnormality. Right A1 segment dominant and widely patent. Left A1 hypoplastic and/or absent, accounting for the mildly diminutive left ICA is compared to the right. Normal anterior communicating artery complex. Anterior cerebral arteries patent without stenosis. No M1 stenosis or occlusion. Distal MCA branches perfused and symmetric. Posterior circulation: Both V4 segments patent without stenosis. Right vertebral artery slightly dominant. Both PICA patent at their origins. Basilar patent without stenosis. Superior cerebellar and posterior cerebral arteries patent bilaterally. Anatomic variants: As above.  No intracranial aneurysm. IMPRESSION: MRI HEAD: Normal  brain MRI for age.  No acute intracranial abnormality. MRA HEAD: Normal intracranial MRA. Electronically Signed   By: Virgia Griffins M.D.   On: 07/25/2023 18:27   MR BRAIN WO CONTRAST Result Date: 07/25/2023 CLINICAL DATA:  Initial evaluation for acute headache, blurry vision. EXAM: MRI HEAD WITHOUT CONTRAST MRA HEAD WITHOUT CONTRAST TECHNIQUE: Multiplanar, multi-echo pulse sequences of the brain and surrounding structures were acquired without intravenous contrast. Angiographic images of the Circle of Willis were acquired using MRA technique without intravenous contrast. COMPARISON:  CT from earlier the same day. FINDINGS: MRI HEAD FINDINGS Brain: Cerebral volume within normal limits. Minimal hazy FLAIR signal abnormality noted within the periventricular white matter, nonspecific, but less than is typically seen for patient age. No evidence for acute or subacute infarct. Gray-white matter differentiation maintained. No erosive chronic cortical infarction. No visible acute or chronic intracranial blood products, although evaluation mildly limited as the SWI MIP sequence only is available for review at time of this dictation. No mass lesion, midline shift or mass effect. No hydrocephalus or extra-axial fluid collection. Pituitary gland suprasellar region within normal limits. Vascular: Major intracranial vascular flow voids are maintained. Skull and upper cervical spine: Craniocervical junction within normal limits. Bone marrow signal intensity normal. No scalp soft tissue abnormality. Sinuses/Orbits: Prior bilateral ocular lens replacement. Paranasal sinuses are largely clear. No significant mastoid effusion. Other: None. MRA HEAD FINDINGS Anterior circulation: Examination mildly degraded by motion artifact. Both internal carotid arteries are patent through the siphons without significant stenosis or other abnormality. Right A1 segment dominant and widely  patent. Left A1 hypoplastic and/or absent,  accounting for the mildly diminutive left ICA is compared to the right. Normal anterior communicating artery complex. Anterior cerebral arteries patent without stenosis. No M1 stenosis or occlusion. Distal MCA branches perfused and symmetric. Posterior circulation: Both V4 segments patent without stenosis. Right vertebral artery slightly dominant. Both PICA patent at their origins. Basilar patent without stenosis. Superior cerebellar and posterior cerebral arteries patent bilaterally. Anatomic variants: As above.  No intracranial aneurysm. IMPRESSION: MRI HEAD: Normal brain MRI for age.  No acute intracranial abnormality. MRA HEAD: Normal intracranial MRA. Electronically Signed   By: Virgia Griffins M.D.   On: 07/25/2023 18:27   DG Chest Port 1 View Result Date: 07/25/2023 CLINICAL DATA:  Weakness EXAM: PORTABLE CHEST 1 VIEW COMPARISON:  Chest x-ray 06/06/2018 FINDINGS: There is some minimal strandy and patchy opacities in the left lung base. Costophrenic angles are clear. There is no pneumothorax. The cardiomediastinal silhouette is within normal limits. No acute fractures are seen. IMPRESSION: Minimal strandy and patchy opacities in the left lung base, atelectasis versus infiltrate. Electronically Signed   By: Tyron Gallon M.D.   On: 07/25/2023 15:16   CT HEAD WO CONTRAST Result Date: 07/25/2023 CLINICAL DATA:  Diplopia EXAM: CT HEAD WITHOUT CONTRAST TECHNIQUE: Contiguous axial images were obtained from the base of the skull through the vertex without intravenous contrast. RADIATION DOSE REDUCTION: This exam was performed according to the departmental dose-optimization program which includes automated exposure control, adjustment of the mA and/or kV according to patient size and/or use of iterative reconstruction technique. COMPARISON:  06/06/2018. FINDINGS: Brain: There is periventricular white matter decreased attenuation consistent with small vessel ischemic changes. Ventricles, sulci and cisterns  are prominent consistent with age related involutional changes. No acute intracranial hemorrhage, mass effect or shift. No hydrocephalus. Vascular: No hyperdense vessel or unexpected calcification. Skull: Normal. Negative for fracture or focal lesion. Sinuses/Orbits: No acute finding. IMPRESSION: Atrophy and chronic small vessel ischemic changes. No acute intracranial process identified. Electronically Signed   By: Sydell Eva M.D.   On: 07/25/2023 15:03   MM 3D SCREENING MAMMOGRAM BILATERAL BREAST Result Date: 06/30/2023 CLINICAL DATA:  Screening. EXAM: DIGITAL SCREENING BILATERAL MAMMOGRAM WITH TOMOSYNTHESIS AND CAD TECHNIQUE: Bilateral screening digital craniocaudal and mediolateral oblique mammograms were obtained. Bilateral screening digital breast tomosynthesis was performed. The images were evaluated with computer-aided detection. COMPARISON:  Previous exam(s). ACR Breast Density Category c: The breasts are heterogeneously dense, which may obscure small masses. FINDINGS: There are no findings suspicious for malignancy. IMPRESSION: No mammographic evidence of malignancy. A result letter of this screening mammogram will be mailed directly to the patient. RECOMMENDATION: Screening mammogram in one year. (Code:[id number]) BI-RADS CATEGORY  1: Negative. Electronically Signed   By: Alinda Apley M.D.   On: 06/30/2023 11:28    Microbiology: Results for orders placed or performed during the hospital encounter of 07/25/23  Resp panel by RT-PCR (RSV, Flu A&B, Covid) Urine, Clean Catch     Status: Abnormal   Collection Time: 07/25/23  2:30 PM   Specimen: Urine, Clean Catch; Nasal Swab  Result Value Ref Range Status   SARS Coronavirus 2 by RT PCR POSITIVE (A) NEGATIVE Final    Comment: (NOTE) SARS-CoV-2 target nucleic acids are DETECTED.  The SARS-CoV-2 RNA is generally detectable in upper respiratory specimens during the acute phase of infection. Positive results are indicative of the presence  of the identified virus, but do not rule out bacterial infection or co-infection with other pathogens not detected by  the test. Clinical correlation with patient history and other diagnostic information is necessary to determine patient infection status. The expected result is Negative.  Fact Sheet for Patients: BloggerCourse.com  Fact Sheet for Healthcare Providers: SeriousBroker.it  This test is not yet approved or cleared by the [place]  FDA and  has been authorized for detection and/or diagnosis of SARS-CoV-2 by FDA under an Emergency Use Authorization (EUA).  This EUA will remain in effect (meaning this test can be used) for the duration of  the COVID-19 declaration under Section 564(b)(1) of the A ct, 21 U.S.C. section 360bbb-3(b)(1), unless the authorization is terminated or revoked sooner.     Influenza A by PCR NEGATIVE NEGATIVE Final   Influenza B by PCR NEGATIVE NEGATIVE Final    Comment: (NOTE) The Xpert Xpress SARS-CoV-2/FLU/RSV plus assay is intended as an aid in the diagnosis of influenza from Nasopharyngeal swab specimens and should not be used as a sole basis for treatment. Nasal washings and aspirates are unacceptable for Xpert Xpress SARS-CoV-2/FLU/RSV testing.  Fact Sheet for Patients: BloggerCourse.com  Fact Sheet for Healthcare Providers: SeriousBroker.it  This test is not yet approved or cleared by the [place]  FDA and has been authorized for detection and/or diagnosis of SARS-CoV-2 by FDA under an Emergency Use Authorization (EUA). This EUA will remain in effect (meaning this test can be used) for the duration of the COVID-19 declaration under Section 564(b)(1) of the Act, 21 U.S.C. section 360bbb-3(b)(1), unless the authorization is terminated or revoked.     Resp Syncytial Virus by PCR NEGATIVE NEGATIVE Final    Comment: (NOTE) Fact Sheet  for Patients: BloggerCourse.com  Fact Sheet for Healthcare Providers: SeriousBroker.it  This test is not yet approved or cleared by the [place]  FDA and has been authorized for detection and/or diagnosis of SARS-CoV-2 by FDA under an Emergency Use Authorization (EUA). This EUA will remain in effect (meaning this test can be used) for the duration of the COVID-19 declaration under Section 564(b)(1) of the Act, 21 U.S.C. section 360bbb-3(b)(1), unless the authorization is terminated or revoked.  Performed at [facility], [address] [place], [place] [address]     Labs: CBC: Recent Labs  Lab 07/25/23 1430  WBC 5.9  HGB 12.4  HCT 37.8  MCV 91.7  PLT 295   Basic Metabolic Panel: Recent Labs  Lab 07/25/23 1430  NA 137  K 3.9  CL 101  CO2 28  GLUCOSE 96  BUN 15  CREATININE 1.04*  CALCIUM 8.9   Liver Function Tests: Recent Labs  Lab 07/25/23 1430  AST 26  ALT 18  ALKPHOS 35*  BILITOT 0.5  PROT 7.0  ALBUMIN 3.5   CBG: No results for input(s): "GLUCAP" in the last 168 hours.  Discharge time spent:  37 minutes.  Signed: Ezzard Holms, MD [facility] 07/26/2023

## 2023-07-26 NOTE — Evaluation (Signed)
 Physical Therapy Evaluation Patient Details Name: Abigail Hatfield MRN: [id number] DOB: 12-28-1945 Today's Date: 07/26/2023  History of Present Illness  Patient is a 78 year old female with generalized weakness, COVID-19, headache. History of dementia.  Clinical Impression  Patient is agreeable to PT evaluation. Patient reports she lives at home with her spouse and elderly mother. She reports no falls at home and she is independent with mobility at baseline.  Today the patient is modified independent with bed mobility and transfers, no physical assistance required. CGA provided for safety initially with ambulation, progressing quickly to supervision with no assistive device required. No loss of balance, shortness of breath, or dizziness reported with mobility. The patient is likely at or nearing baseline level of functional independence. No anticipated PT needs after this hospital stay at this time. PT will continue to follow while in the hospital to maximize independence.       If plan is discharge home, recommend the following: Supervision due to cognitive status;Assist for transportation   Can travel by private vehicle        Equipment Recommendations None recommended by PT  Recommendations for Other Services       Functional Status Assessment Patient has had a recent decline in their functional status and demonstrates the ability to make significant improvements in function in a reasonable and predictable amount of time.     Precautions / Restrictions Precautions Precautions: Other (comment) (low fall risk) Recall of Precautions/Restrictions: Impaired Restrictions Weight Bearing Restrictions Per Provider Order: No      Mobility  Bed Mobility Overal bed mobility: Modified Independent                  Transfers Overall transfer level: Modified independent                      Ambulation/Gait Ambulation/Gait assistance: Supervision, Contact guard  assist Gait Distance (Feet): 40 Feet Assistive device: None Gait Pattern/deviations: Step-through pattern Gait velocity: decreased     General Gait Details: steady with ambulation without assistive device. no dizziness or shortness of breath with mobility. CGA provided initially for safety, progressing to supervision.  Stairs            Wheelchair Mobility     Tilt Bed    Modified Rankin (Stroke Patients Only)       Balance Overall balance assessment: Needs assistance Sitting-balance support: Feet supported Sitting balance-Leahy Scale: Good Sitting balance - Comments: reaching outside base of support without loss of balance   Standing balance support: No upper extremity supported, During functional activity Standing balance-Leahy Scale: Fair                               Pertinent Vitals/Pain Pain Assessment Pain Assessment: No/denies pain    Home Living Family/patient expects to be discharged to:: Private residence Living Arrangements: Spouse/significant other;Parent Available Help at Discharge: Family;Available 24 hours/day Type of Home: House Home Access: Stairs to enter   [employer] of Steps: 3       Additional Comments: patient reports she lives with her mother (who is 37 years old) and her spouse. chart indicates someone is in the home 24/7    Prior Function Prior Level of Function : Independent/Modified Independent;Driving                     Extremity/Trunk Assessment   Upper Extremity Assessment Upper Extremity Assessment:  Overall [facility] for tasks assessed    Lower Extremity Assessment Lower Extremity Assessment: Overall WFL for tasks assessed       Communication   Communication Communication: No apparent difficulties    Cognition Arousal: Alert Behavior During Therapy: WFL for tasks assessed/performed   PT - Cognitive impairments: History of cognitive impairments                       PT -  Cognition Comments: patient is cooperative throuhgout session. she has some confusion about recent events Following commands: Intact       Cueing Cueing Techniques: Verbal cues     General Comments General comments (skin integrity, edema, etc.): patient able to complete all toileting tasks without physical assistance    Exercises     Assessment/Plan    PT Assessment Patient needs continued PT services  PT Problem List Decreased strength;Decreased activity tolerance;Decreased range of motion;Decreased balance;Decreased mobility;Decreased cognition;Decreased safety awareness       PT Treatment Interventions DME instruction;Gait training;Stair training;Functional mobility training;Therapeutic activities;Therapeutic exercise;Balance training;Neuromuscular re-education;Cognitive remediation    PT Goals (Current goals can be found in the Care Plan section)  Acute Rehab PT Goals Patient Stated Goal: to go home PT Goal Formulation: With patient Time For Goal Achievement: 08/09/23 Potential to Achieve Goals: Good    Frequency Min 2X/week     Co-evaluation               AM-PAC PT "6 Clicks" Mobility  Outcome Measure Help needed turning from your back to your side while in a flat bed without using bedrails?: None Help needed moving from lying on your back to sitting on the side of a flat bed without using bedrails?: None Help needed moving to and from a bed to a chair (including a wheelchair)?: None Help needed standing up from a chair using your arms (e.g., wheelchair or bedside chair)?: None Help needed to walk in hospital room?: A Little Help needed climbing 3-5 steps with a railing? : A Little 6 Click Score: 22    End of Session   Activity Tolerance: Patient tolerated treatment well Patient left: in chair;with call bell/phone within reach (set-up with breakfast tray) Nurse Communication: Mobility status PT Visit Diagnosis: Muscle weakness (generalized)  (M62.81);Unsteadiness on feet (R26.81)    Time: 8295-6213 PT Time Calculation (min) (ACUTE ONLY): 11 min   Charges:   PT Evaluation $PT Eval Low Complexity: 1 Low   PT General Charges $$ ACUTE PT VISIT: 1 Visit         Ozie Bo, PT, MPT   Abigail Hatfield 07/26/2023, 10:02 AM

## 2023-08-05 ENCOUNTER — Other Ambulatory Visit: Payer: Self-pay | Admitting: Family Medicine

## 2023-08-05 ENCOUNTER — Ambulatory Visit (INDEPENDENT_AMBULATORY_CARE_PROVIDER_SITE_OTHER): Admitting: Family Medicine

## 2023-08-05 ENCOUNTER — Encounter: Payer: Self-pay | Admitting: Family Medicine

## 2023-08-05 VITALS — BP 124/76 | HR 85 | Resp 16 | Ht 60.0 in | Wt 185.4 lb

## 2023-08-05 DIAGNOSIS — F341 Dysthymic disorder: Secondary | ICD-10-CM

## 2023-08-05 DIAGNOSIS — E538 Deficiency of other specified B group vitamins: Secondary | ICD-10-CM

## 2023-08-05 DIAGNOSIS — E7800 Pure hypercholesterolemia, unspecified: Secondary | ICD-10-CM

## 2023-08-05 DIAGNOSIS — F01B3 Vascular dementia, moderate, with mood disturbance: Secondary | ICD-10-CM | POA: Insufficient documentation

## 2023-08-05 DIAGNOSIS — N1831 Chronic kidney disease, stage 3a: Secondary | ICD-10-CM | POA: Diagnosis not present

## 2023-08-05 DIAGNOSIS — M545 Low back pain, unspecified: Secondary | ICD-10-CM

## 2023-08-05 DIAGNOSIS — G8929 Other chronic pain: Secondary | ICD-10-CM

## 2023-08-05 DIAGNOSIS — E559 Vitamin D deficiency, unspecified: Secondary | ICD-10-CM

## 2023-08-05 NOTE — Progress Notes (Signed)
 Name: Abigail Hatfield   MRN: [id number]    DOB: 05-Jan-1946   Date:08/05/2023       Progress Note  Subjective  Chief Complaint  Chief Complaint  Patient presents with   Form Completion   Medical Management of Chronic Issues    Discussed the use of AI scribe software for clinical note transcription with the patient, who gave verbal consent to proceed.  History of Present Illness Abigail Hatfield is a 78 year old female with moderate vascular dementia who presents for paperwork completion related to driving assessment and medication management.  She has moderate vascular dementia, leading to mood changes and memory issues. Her daughter noticed behavioral changes years ago, and she herself felt different. She is currently taking donepezil  5 mg at night and paroxetine  30 mg for mood changes. Her sister ensures she takes her prescribed doses. She has significant mood changes, including episodes of belligerence and attempts to drive. Her sister reported an incident where she attempted to leave the house and walk several blocks, requiring police intervention. Her driver's license is under review due to cognitive concerns.  She experiences chronic low back pain and takes gabapentin  100 mg, two pills in the morning and two at night, for nerve pain. It is unclear if it also helps with mood.  She has chronic kidney disease stage 3A, requiring avoidance of NSAIDs like Aleve and Motrin. She takes Tylenol  for pain management. Her kidney function is slightly below normal, typical for her age.  She has experienced weight gain, with a BMI over 35, categorized as morbidly obese. She has difficulty with mobility, especially after a recent COVID-19 infection, which left her feeling tired and dizzy. She was hospitalized for COVID-19 approximately 10-12 days ago and is still recovering.  Her current medications include simvastatin  for cholesterol management, vitamin D3, vitamin B12, and vitamin B6 supplements. She  is due for a cholesterol check and monitoring of her vitamin levels.    Patient Active Problem List   Diagnosis Date Noted   Moderate vascular dementia with mood disturbance (HCC) 08/05/2023   Morbid obesity (HCC) 08/05/2023   Stage 3a chronic kidney disease (HCC) 08/05/2023   Hyperglycemia 09/05/2020   Low serum vitamin B12 09/05/2020   Chronic bilateral low back pain without sciatica 09/05/2020   DDD (degenerative disc disease), lumbosacral 12/07/2017   Senile purpura (HCC) 12/07/2017   Hyperlipidemia 12/21/2014    Social History   Tobacco Use   Smoking status: Former    Current packs/day: 0.00    Average packs/day: 0.5 packs/day for 5.0 years (2.5 ttl pk-yrs)    Types: Cigarettes    Start date: 12/08/1983    Quit date: 12/07/1988    Years since quitting: 34.6   Smokeless tobacco: Never   Tobacco comments:    smoking cessation materials not required  Substance Use Topics   Alcohol use: No    Alcohol/week: 0.0 standard drinks of alcohol     Current Outpatient Medications:    acetaminophen  (TYLENOL ) 325 MG tablet, Take 2 tablets (650 mg total) by mouth every 6 (six) hours as needed for mild pain (pain score 1-3) (or Fever >/= 101)., Disp: 20 tablet, Rfl: 0   cyanocobalamin  (VITAMIN B12) 1000 MCG tablet, Take 1 tablet (1,000 mcg total) by mouth daily., Disp: 90 tablet, Rfl: 1   donepezil  (ARICEPT ) 5 MG tablet, Take 5 mg by mouth at bedtime., Disp: , Rfl:    gabapentin  (NEURONTIN ) 100 MG capsule, Take 200 mg by mouth 2 (two)  times daily., Disp: , Rfl:    PARoxetine  (PAXIL ) 30 MG tablet, Take 30 mg by mouth daily., Disp: , Rfl:    pyridOXINE (VITAMIN B6) 100 MG tablet, Take 100 mg by mouth daily., Disp: , Rfl:    simvastatin  (ZOCOR ) 20 MG tablet, TAKE 1 TABLET BY MOUTH AT BEDTIME, Disp: 90 tablet, Rfl: 1   Cholecalciferol (VITAMIN D3) 50 MCG (2000 UT) capsule, Take 1 capsule (2,000 Units total) by mouth daily. (Patient not taking: Reported on 07/26/2023), Disp: 90 capsule, Rfl:  1  Allergies  Allergen Reactions   Moxifloxacin Hcl In Nacl Swelling   Prednisone Other (See Comments)    Paranoid   Penicillins Rash and Other (See Comments)    Has patient had a PCN reaction causing immediate rash, facial/tongue/throat swelling, SOB or lightheadedness with hypotension: Unknown Has patient had a PCN reaction causing severe rash involving mucus membranes or skin necrosis: No Has patient had a PCN reaction that required hospitalization: Yes Has patient had a PCN reaction occurring within the last 10 years: No If all of the above answers are "NO", then may proceed with Cephalosporin use.     ROS  Ten systems reviewed and is negative except as mentioned in HPI    Objective  Vitals:   08/05/23 1005  BP: 124/76  Pulse: 85  Resp: 16  SpO2: 95%  Weight: 185 lb 6.4 oz (84.1 kg)  Height: 5' (1.524 m)    Body mass index is 36.21 kg/m. Physical Exam  Constitutional: Patient appears well-developed and well-nourished. Obese  No distress.  HEENT: head atraumatic, normocephalic, pupils equal and reactive to light, neck supple Cardiovascular: Normal rate, regular rhythm and normal heart sounds.  No murmur heard. Trace BLE edema. Pulmonary/Chest: Effort normal and breath sounds normal. No respiratory distress. Abdominal: Soft.  There is no tenderness. Psychiatric: Patient is a little depressed, but answered questions    MEASUREMENTS: BMI- 35.0. CONSTITUTIONAL: Patient appears well-developed and well-nourished. No distress. HEENT: Head atraumatic, normocephalic, neck supple. CARDIOVASCULAR: Normal rate, regular rhythm and normal heart sounds. No murmur heard. Slight swelling in leg. PULMONARY: Effort normal and breath sounds normal. No respiratory distress. ABDOMINAL: There is no tenderness or distention. MUSCULOSKELETAL: Normal gait. Without gross motor or sensory deficit. PSYCHIATRIC: Patient has a normal mood and affect. Behavior is normal. Judgment and thought  content normal.  Recent Results (from the past 2160 hours)  CBC     Status: None   Collection Time: 07/25/23  2:30 PM  Result Value Ref Range   WBC 5.9 4.0 - 10.5 K/uL   RBC 4.12 3.87 - 5.11 MIL/uL   Hemoglobin 12.4 12.0 - 15.0 g/dL   HCT 16.1 09.6 - 04.5 %   MCV 91.7 80.0 - 100.0 fL   MCH 30.1 26.0 - 34.0 pg   MCHC 32.8 30.0 - 36.0 g/dL   RDW 40.9 81.1 - 91.4 %   Platelets 295 150 - 400 K/uL   nRBC 0.0 0.0 - 0.2 %    Comment: Performed at [facility], [address] [place], [place] [address]  Urinalysis, Routine w reflex microscopic -Urine, Clean Catch     Status: Abnormal   Collection Time: 07/25/23  2:30 PM  Result Value Ref Range   Color, Urine YELLOW (A) YELLOW   APPearance CLEAR (A) CLEAR   Specific Gravity, Urine 1.012 1.005 - 1.030   pH 7.0 5.0 - 8.0   Glucose, UA NEGATIVE NEGATIVE mg/dL   Hgb urine dipstick LARGE (A) NEGATIVE  Bilirubin Urine NEGATIVE NEGATIVE   Ketones, ur NEGATIVE NEGATIVE mg/dL   Protein, ur NEGATIVE NEGATIVE mg/dL   Nitrite NEGATIVE NEGATIVE   Leukocytes,Ua SMALL (A) NEGATIVE   RBC / HPF 21-50 0 - 5 RBC/hpf   WBC, UA 0-5 0 - 5 WBC/hpf   Bacteria, UA NONE SEEN NONE SEEN   Squamous Epithelial / HPF 0-5 0 - 5 /HPF   Mucus PRESENT     Comment: Performed at [facility], [address] [place], [place] [address]  Comprehensive metabolic panel     Status: Abnormal   Collection Time: 07/25/23  2:30 PM  Result Value Ref Range   Sodium 137 135 - 145 mmol/L   Potassium 3.9 3.5 - 5.1 mmol/L   Chloride 101 98 - 111 mmol/L   CO2 28 22 - 32 mmol/L   Glucose, Bld 96 70 - 99 mg/dL    Comment: Glucose reference range applies only to samples taken after fasting for at least 8 hours.   BUN 15 8 - 23 mg/dL   Creatinine, Ser 6.04 (H) 0.44 - 1.00 mg/dL   Calcium 8.9 8.9 - 54.0 mg/dL   Total Protein 7.0 6.5 - 8.1 g/dL   Albumin 3.5 3.5 - 5.0 g/dL   AST 26 15 - 41 U/L   ALT 18 0 - 44 U/L   Alkaline Phosphatase 35 (L) 38 - 126 U/L    Total Bilirubin 0.5 0.0 - 1.2 mg/dL   GFR, Estimated 55 (L) >60 mL/min    Comment: (NOTE) Calculated using the CKD-EPI Creatinine Equation (2021)    Anion gap 8 5 - 15    Comment: Performed at [facility], [address] [place], [place] [address]  Troponin I (High Sensitivity)     Status: None   Collection Time: 07/25/23  2:30 PM  Result Value Ref Range   Troponin I (High Sensitivity) 9 <18 ng/L    Comment: (NOTE) Elevated high sensitivity troponin I (hsTnI) values and significant  changes across serial measurements may suggest ACS but many other  chronic and acute conditions are known to elevate hsTnI results.  Refer to the "Links" section for chest pain algorithms and additional  guidance. Performed at [facility], [address] [place], [place] [address]   Resp panel by RT-PCR (RSV, Flu A&B, Covid) Urine, Clean Catch     Status: Abnormal   Collection Time: 07/25/23  2:30 PM   Specimen: Urine, Clean Catch; Nasal Swab  Result Value Ref Range   SARS Coronavirus 2 by RT PCR POSITIVE (A) NEGATIVE    Comment: (NOTE) SARS-CoV-2 target nucleic acids are DETECTED.  The SARS-CoV-2 RNA is generally detectable in upper respiratory specimens during the acute phase of infection. Positive results are indicative of the presence of the identified virus, but do not rule out bacterial infection or co-infection with other pathogens not detected by the test. Clinical correlation with patient history and other diagnostic information is necessary to determine patient infection status. The expected result is Negative.  Fact Sheet for Patients: BloggerCourse.com  Fact Sheet for Healthcare Providers: SeriousBroker.it  This test is not yet approved or cleared by the [place]  FDA and  has been authorized for detection and/or diagnosis of SARS-CoV-2 by FDA under an Emergency Use Authorization (EUA).  This EUA  will remain in effect (meaning this test can be used) for the duration of  the COVID-19 declaration under Section 564(b)(1) of the A ct, 21 U.S.C. section 360bbb-3(b)(1), unless the authorization  is terminated or revoked sooner.     Influenza A by PCR NEGATIVE NEGATIVE   Influenza B by PCR NEGATIVE NEGATIVE    Comment: (NOTE) The Xpert Xpress SARS-CoV-2/FLU/RSV plus assay is intended as an aid in the diagnosis of influenza from Nasopharyngeal swab specimens and should not be used as a sole basis for treatment. Nasal washings and aspirates are unacceptable for Xpert Xpress SARS-CoV-2/FLU/RSV testing.  Fact Sheet for Patients: BloggerCourse.com  Fact Sheet for Healthcare Providers: SeriousBroker.it  This test is not yet approved or cleared by the [place]  FDA and has been authorized for detection and/or diagnosis of SARS-CoV-2 by FDA under an Emergency Use Authorization (EUA). This EUA will remain in effect (meaning this test can be used) for the duration of the COVID-19 declaration under Section 564(b)(1) of the Act, 21 U.S.C. section 360bbb-3(b)(1), unless the authorization is terminated or revoked.     Resp Syncytial Virus by PCR NEGATIVE NEGATIVE    Comment: (NOTE) Fact Sheet for Patients: BloggerCourse.com  Fact Sheet for Healthcare Providers: SeriousBroker.it  This test is not yet approved or cleared by the [place]  FDA and has been authorized for detection and/or diagnosis of SARS-CoV-2 by FDA under an Emergency Use Authorization (EUA). This EUA will remain in effect (meaning this test can be used) for the duration of the COVID-19 declaration under Section 564(b)(1) of the Act, 21 U.S.C. section 360bbb-3(b)(1), unless the authorization is terminated or revoked.  Performed at [facility], [address] [place], [place] [address]   Procalcitonin      Status: None   Collection Time: 07/25/23  2:30 PM  Result Value Ref Range   Procalcitonin <0.10 ng/mL    Comment:        Interpretation: PCT (Procalcitonin) <= 0.5 ng/mL: Systemic infection (sepsis) is not likely. Local bacterial infection is possible. (NOTE)       Sepsis PCT Algorithm           Lower Respiratory Tract                                      Infection PCT Algorithm    ----------------------------     ----------------------------         PCT < 0.25 ng/mL                PCT < 0.10 ng/mL          Strongly encourage             Strongly discourage   discontinuation of antibiotics    initiation of antibiotics    ----------------------------     -----------------------------       PCT 0.25 - 0.50 ng/mL            PCT 0.10 - 0.25 ng/mL               OR       >80% decrease in PCT            Discourage initiation of                                            antibiotics      Encourage discontinuation           of antibiotics    ----------------------------     -----------------------------  PCT >= 0.50 ng/mL              PCT 0.26 - 0.50 ng/mL               AND        <80% decrease in PCT             Encourage initiation of                                             antibiotics       Encourage continuation           of antibiotics    ----------------------------     -----------------------------        PCT >= 0.50 ng/mL                  PCT > 0.50 ng/mL               AND         increase in PCT                  Strongly encourage                                      initiation of antibiotics    Strongly encourage escalation           of antibiotics                                     -----------------------------                                           PCT <= 0.25 ng/mL                                                 OR                                        > 80% decrease in PCT                                      Discontinue / Do not initiate                                              antibiotics  Performed at [facility], [address] [place], [place] [address]       Assessment & Plan Moderate vascular dementia Moderate vascular dementia with mood changes, cognitive decline, and recent belligerence. Neurologist restricted driving due to cognitive concerns. Current medications include donepezil  and paroxetine . Family reports difficulty managing behavior. - Continue donepezil  and paroxetine  as prescribed. - Reinforce no  driving due to cognitive impairment. - Complete DMV paperwork for license assessment.  Depression Depression likely exacerbated by anticipatory grief related to mother's hospice care. Managed with paroxetine  30 mg daily. - Continue paroxetine  30 mg daily.  Chronic bilateral low back pain Chronic bilateral low back pain managed with gabapentin . No sciatica present. Gabapentin  may contribute to sleepiness. Family uncertain about gabapentin 's indication. - Continue gabapentin  as prescribed for pain management. - Consider acetaminophen  for additional pain relief if needed.  Chronic kidney disease, stage 3A Chronic kidney disease stage 3A, mild. Advised to avoid NSAIDs to prevent further kidney damage. - Avoid NSAIDs such as naproxen and ibuprofen. - Encourage hydration and weight loss to support kidney function.  Morbid obesity Morbid obesity with BMI over 35. Advised to reduce caloric intake and increase physical activity. - Encourage dietary modifications to reduce caloric intake. - Advise increased physical activity, such as walking on a track.     There are no diagnoses linked to this encounter.

## 2023-08-06 ENCOUNTER — Encounter: Payer: Self-pay | Admitting: Family Medicine

## 2023-08-06 LAB — LIPID PANEL
Cholesterol: 160 mg/dL (ref <200)
HDL: 57 mg/dL (ref >=50)
LDL Cholesterol (Calc): 80 mg/dL
Non-HDL Cholesterol (Calc): 103 mg/dL (ref <130)
Total CHOL/HDL Ratio: 2.8 (calc) (ref <5.0)
Triglycerides: 135 mg/dL (ref <150)

## 2023-08-06 LAB — B12 AND FOLATE PANEL
Folate: 6.1 ng/mL
Vitamin B-12: 1412 pg/mL — ABNORMAL HIGH (ref 200–1100)

## 2023-08-06 LAB — VITAMIN D 25 HYDROXY (VIT D DEFICIENCY, FRACTURES): Vit D, 25-Hydroxy: 41 ng/mL (ref 30–100)

## 2023-08-20 LAB — HEMOGLOBIN A1C: Hemoglobin A1C: 5.8

## 2023-08-20 LAB — HM DIABETES FOOT EXAM: HM Diabetic Foot Exam: NORMAL

## 2023-10-07 ENCOUNTER — Ambulatory Visit: Payer: Self-pay

## 2023-10-07 NOTE — Telephone Encounter (Signed)
  FYI Only or Action Required?: FYI only for provider.  Patient was last seen in primary care on 08/05/2023 by Glenard Mire, MD. Called Nurse Triage reporting Urinary Frequency. Symptoms began a week ago. Interventions attempted: OTC medications: azo. Symptoms are: unchanged.  Triage Disposition: See Physician Within 24 Hours  Patient/caregiver understands and will follow disposition?: Yes   Copied from CRM #[id number]. Topic: Clinical - Red Word Triage >> Oct 07, 2023  9:44 AM Abigail Hatfield wrote: [facility] that prompted transfer to Nurse Triage: Patient states she has a possible UTI for about a week. Symptoms include an urgency to go, increased frequently, and burning with urination. Reason for Disposition  Urinating more frequently than usual (i.e., frequency)  Answer Assessment - Initial Assessment Questions 1. SYMPTOM: What's the main symptom you're concerned about? (e.g., frequency, incontinence)     Initial symptom was burning, but is experiencing frequency and urgency 2. ONSET: When did the  symptoms  start?     About five days ago 3. PAIN: Is there any pain? If Yes, ask: How bad is it? (Scale: 1-10; mild, moderate, severe)     denies 4. CAUSE: What do you think is causing the symptoms?     Possible UTI 5. OTHER SYMPTOMS: Do you have any other symptoms? (e.g., blood in urine, fever, flank pain, pain with urination)     denies  Protocols used: Urinary Symptoms-A-AH

## 2023-10-09 ENCOUNTER — Ambulatory Visit (INDEPENDENT_AMBULATORY_CARE_PROVIDER_SITE_OTHER): Admitting: Family Medicine

## 2023-10-09 DIAGNOSIS — Z91199 Patient's noncompliance with other medical treatment and regimen due to unspecified reason: Secondary | ICD-10-CM

## 2023-10-09 DIAGNOSIS — R300 Dysuria: Secondary | ICD-10-CM

## 2023-10-12 ENCOUNTER — Ambulatory Visit: Payer: Self-pay

## 2023-10-12 NOTE — Telephone Encounter (Signed)
 Lvm asking pt to return call to schedule appt

## 2023-10-12 NOTE — Telephone Encounter (Signed)
 FYI Only or Action Required?: FYI only for provider.  Patient was last seen in primary care on 08/05/2023 by Glenard Mire, MD. Called Nurse Triage reporting Urinary Frequency. Symptoms began several days ago. Interventions attempted: OTC medications: Azo. Symptoms are: unchanged.  Triage Disposition: See Physician Within 24 Hours  Patient/caregiver understands and will follow disposition?: Yes  Copied from CRM [id number]. Topic: Clinical - Red Word Triage >> Oct 12, 2023 11:43 AM Treva T wrote: [facility] that prompted transfer to Nurse Triage: Received call from daughter, Tobias (DPR verified) states mom is having pain with urination, with urgency and frequency. She is taking AZO over the counter, per patient daughter sates she missed an appointment for 10/09/23, as patient was confused and went to incorrect office.   Above Symptoms are still present. Reason for Disposition  Urinating more frequently than usual (i.e., frequency)  Answer Assessment - Initial Assessment Questions The available times for today and tomorrow in PCP office does not work for pt because she has other MD appmt's. Pt being set up with an UC appmt tomorrow for her urinary freq s/s.  Pt and daughter given UC address/phone #.   1. SYMPTOM: What's the main symptom you're concerned about? (e.g., frequency, incontinence)     Urinary freq, no burning, confusion.   2. ONSET: When did the  s/s  start?     UTI s/s for a week now   3. PAIN: Is there any pain? If Yes, ask: How bad is it? (Scale: 1-10; mild, moderate, severe)     No burning/pain but having freq/urgency   4. CAUSE: What do you think is causing the symptoms?     Unknown   5. OTHER SYMPTOMS: Do you have any other symptoms? (e.g., blood in urine, fever, flank pain, pain with urination)     Freq/urgency, some low back pain but does not know if associated with kidneys bc pt does have back pain normally. Pt is able to uriante. No fevers they are aware  of.  Protocols used: Urinary Symptoms-A-AH

## 2023-10-13 ENCOUNTER — Ambulatory Visit
Admission: RE | Admit: 2023-10-13 | Discharge: 2023-10-13 | Disposition: A | Discharge status: Home / Self Care | Payer: Self-pay | Source: Ambulatory Visit | Attending: Emergency Medicine | Admitting: Emergency Medicine

## 2023-10-13 VITALS — BP 118/81 | HR 76 | Temp 98.8°F | Resp 18

## 2023-10-13 DIAGNOSIS — R350 Frequency of micturition: Secondary | ICD-10-CM | POA: Diagnosis present

## 2023-10-13 DIAGNOSIS — N3000 Acute cystitis without hematuria: Secondary | ICD-10-CM | POA: Diagnosis present

## 2023-10-13 LAB — POCT URINALYSIS DIP (MANUAL ENTRY)
Bilirubin, UA: NEGATIVE
Glucose, UA: NEGATIVE mg/dL
Ketones, POC UA: NEGATIVE mg/dL
Nitrite, UA: POSITIVE — AB
Spec Grav, UA: 1.025 (ref 1.010–1.025)
Urobilinogen, UA: 0.2 U/dL
pH, UA: 5.5 (ref 5.0–8.0)

## 2023-10-13 MED ORDER — CEPHALEXIN 500 MG PO CAPS: 500.0000 mg | ORAL_CAPSULE | Freq: Four times a day (QID) | ORAL | 0 refills | Status: DC

## 2023-10-13 NOTE — ED Provider Notes (Signed)
 CAY RALPH PELT    CSN: [id number] Arrival date & time: 10/13/23  1413      History   Chief Complaint Chief Complaint  Patient presents with   Urinary Frequency    Entered by patient   Dysuria    HPI Abigail Hatfield is a 78 y.o. female.   Patient presents for evaluation of urinary frequency, dysuria present for 7 days.  Attempted use of Azo which has been somewhat helpful but symptoms persisted.  Denies hematuria, abdominal flank pain, fever or vaginal symptoms.  Past Medical History:  Diagnosis Date   Allergy    Anxiety    Cataract    Depression    Hyperlipidemia    Medical history non-contributory     Patient Active Problem List   Diagnosis Date Noted   Moderate vascular dementia with mood disturbance (HCC) 08/05/2023   Morbid obesity (HCC) 08/05/2023   Stage 3a chronic kidney disease (HCC) 08/05/2023   Hyperglycemia 09/05/2020   Low serum vitamin B12 09/05/2020   Chronic bilateral low back pain without sciatica 09/05/2020   DDD (degenerative disc disease), lumbosacral 12/07/2017   Senile purpura (HCC) 12/07/2017   Hyperlipidemia 12/21/2014    Past Surgical History:  Procedure Laterality Date   ABDOMINAL HYSTERECTOMY  1980   CATARACT EXTRACTION W/PHACO Right 06/24/2017   Procedure: CATARACT EXTRACTION PHACO AND INTRAOCULAR LENS PLACEMENT (IOC);  Surgeon: Jaye Fallow, MD;  Location: [facility] ORS;  Service: Ophthalmology;  Laterality: Right;  US  00:48.0 AP% 16.5 CDE 7.92 Fluid Pack Lot # [id number]   CATARACT EXTRACTION W/PHACO Left 07/14/2017   Procedure: CATARACT EXTRACTION PHACO AND INTRAOCULAR LENS PLACEMENT (IOC);  Surgeon: Jaye Fallow, MD;  Location: [facility] ORS;  Service: Ophthalmology;  Laterality: Left;  US  00:36.0 AP% 16.9 CDE 6.08 Fluid Pack Lot # [id number]   CESAREAN SECTION  1967   CESAREAN SECTION  1971   CHOLECYSTECTOMY  1972   COLONOSCOPY WITH PROPOFOL  N/A 12/24/2017   Procedure: COLONOSCOPY WITH PROPOFOL ;  Surgeon: Therisa Bi, MD;   Location: [facility] ENDOSCOPY;  Service: Gastroenterology;  Laterality: N/A;   EYE SURGERY     retal fistula repair     Dr. Dellie x 2    OB History     Gravida  2   Para  2   Term  2   Preterm      AB      Living  2      SAB      IAB      Ectopic      Multiple      Live Births               Home Medications    Prior to Admission medications   Medication Sig Start Date End Date Taking? Authorizing Provider  cephALEXin  (KEFLEX ) 500 MG capsule Take 1 capsule (500 mg total) by mouth 4 (four) times daily. 10/13/23  Yes Mazzie Brodrick, Shelba SAUNDERS, NP  Cholecalciferol (VITAMIN D3) 50 MCG (2000 UT) capsule Take 1 capsule (2,000 Units total) by mouth daily. 03/25/22  Yes Sowles, Krichna, MD  acetaminophen  (TYLENOL ) 325 MG tablet Take 2 tablets (650 mg total) by mouth every 6 (six) hours as needed for mild pain (pain score 1-3) (or Fever >/= 101). 07/26/23   Dorinda Drue DASEN, MD  cyanocobalamin  (VITAMIN B12) 1000 MCG tablet Take 1 tablet (1,000 mcg total) by mouth daily. 03/25/22   Sowles, Krichna, MD  donepezil  (ARICEPT ) 5 MG tablet Take 5 mg by mouth at bedtime.  [provider]  gabapentin  (NEURONTIN ) 100 MG capsule Take 200 mg by mouth 2 (two) times daily.    [provider]  PARoxetine  (PAXIL ) 30 MG tablet Take 30 mg by mouth daily.    Potter, Zachary E, MD  pyridOXINE (VITAMIN B6) 100 MG tablet Take 100 mg by mouth daily.    [provider]  simvastatin  (ZOCOR ) 20 MG tablet TAKE 1 TABLET BY MOUTH AT BEDTIME 08/05/23   Sowles, Krichna, MD    Family History Family History  Problem Relation Age of Onset   Diabetes Mother    Hypertension Mother    Heart attack Mother    Heart failure Father    Heart disease Father    Diabetes Brother    Hypertension Brother    COPD Brother    Diabetes Sister    Breast cancer Neg Hx     Social History Social History   Tobacco Use   Smoking status: Former    Current packs/day: 0.00    Average packs/day: 0.5  packs/day for 5.0 years (2.5 ttl pk-yrs)    Types: Cigarettes    Start date: 12/08/1983    Quit date: 12/07/1988    Years since quitting: 34.8   Smokeless tobacco: Never   Tobacco comments:    smoking cessation materials not required  Vaping Use   Vaping status: Never Used  Substance Use Topics   Alcohol use: No    Alcohol/week: 0.0 standard drinks of alcohol   Drug use: No     Allergies   Moxifloxacin hcl in nacl, Prednisone, and Penicillins   Review of Systems Review of Systems   Physical Exam Triage Vital Signs ED Triage Vitals  Encounter Vitals Group     BP 10/13/23 1426 118/81     Girls Systolic BP Percentile --      Girls Diastolic BP Percentile --      Boys Systolic BP Percentile --      Boys Diastolic BP Percentile --      Pulse Rate 10/13/23 1426 76     Resp 10/13/23 1426 18     Temp 10/13/23 1426 98.8 F (37.1 C)     Temp Source 10/13/23 1426 Oral     SpO2 10/13/23 1426 95 %     Weight --      Height --      Head Circumference --      Peak Flow --      Pain Score 10/13/23 1428 0     Pain Loc --      Pain Education --      Exclude from Growth Chart --    No data found.  Updated Vital Signs BP 118/81 (BP Location: Left Arm)   Pulse 76   Temp 98.8 F (37.1 C) (Oral)   Resp 18   SpO2 95%   Visual Acuity Right Eye Distance:   Left Eye Distance:   Bilateral Distance:    Right Eye Near:   Left Eye Near:    Bilateral Near:     Physical Exam Constitutional:      Appearance: Normal appearance.   Eyes:     Extraocular Movements: Extraocular movements intact.   Pulmonary:     Effort: Pulmonary effort is normal.  Abdominal:     General: Abdomen is flat. Bowel sounds are normal.     Palpations: Abdomen is soft.     Tenderness: There is abdominal tenderness in the suprapubic area. There is no right CVA tenderness  or left CVA tenderness.   Neurological:     Mental Status: She is alert and oriented to person, place, and time.      UC  Treatments / Results  Labs (all labs ordered are listed, but only abnormal results are displayed) Labs Reviewed  POCT URINALYSIS DIP (MANUAL ENTRY) - Abnormal; Notable for the following components:      Result Value   Color, UA orange (*)    Blood, UA small (*)    Protein Ur, POC trace (*)    Nitrite, UA Positive (*)    Leukocytes, UA Trace (*)    All other components within normal limits  URINE CULTURE    EKG   Radiology No results found.  Procedures Procedures (including critical care time)  Medications Ordered in UC Medications - No data to display  Initial Impression / Assessment and Plan / UC Course  I have reviewed the triage vital signs and the nursing notes.  Pertinent labs & imaging results that were available during my care of the patient were reviewed by me and considered in my medical decision making (see chart for details).  Acute cystitis without hematuria, urinary frequency  Urinalysis positive for nitrates, sent for culture, initiating cephalexin , recommended supportive care with follow-up as needed Final Clinical Impressions(s) / UC Diagnoses   Final diagnoses:  Urinary frequency  Acute cystitis without hematuria     Discharge Instructions      Your urinalysis shows Abigail Hatfield blood cells and nitrates which are indicative of infection, your urine will be sent to the lab to determine exactly which bacteria is present, if any changes need to be made to your medications you will be notified  Begin use of Keflex  every 6 hours for 5 days  You may use over-the-counter Azo to help minimize your symptoms until antibiotic removes bacteria, this medication will turn your urine orange  Increase your fluid intake through use of water  As always practice good hygiene, wiping front to back and avoidance of scented vaginal products to prevent further irritation  If symptoms continue to persist after use of medication or recur please follow-up with urgent care or  your primary doctor as needed    ED Prescriptions     Medication Sig Dispense Auth. Provider   cephALEXin  (KEFLEX ) 500 MG capsule Take 1 capsule (500 mg total) by mouth 4 (four) times daily. 20 capsule Halo Laski R, NP      PDMP not reviewed this encounter.   Teresa Shelba SAUNDERS, NP 10/13/23 1452

## 2023-10-13 NOTE — ED Triage Notes (Signed)
 Urinary frequency and painful urination x 1 week. Patient was using AZO to help with some of them symptoms.

## 2023-10-13 NOTE — Discharge Instructions (Signed)
 Your urinalysis shows Abigail Hatfield blood cells and nitrates which are indicative of infection, your urine will be sent to the lab to determine exactly which bacteria is present, if any changes need to be made to your medications you will be notified  Begin use of Keflex  every 6 hours for 5 days  You may use over-the-counter Azo to help minimize your symptoms until antibiotic removes bacteria, this medication will turn your urine orange  Increase your fluid intake through use of water  As always practice good hygiene, wiping front to back and avoidance of scented vaginal products to prevent further irritation  If symptoms continue to persist after use of medication or recur please follow-up with urgent care or your primary doctor as needed

## 2023-10-14 NOTE — Progress Notes (Signed)
 No show

## 2023-10-15 ENCOUNTER — Ambulatory Visit (HOSPITAL_COMMUNITY): Payer: Self-pay

## 2023-10-15 LAB — URINE CULTURE: Culture: 50000 — AB

## 2023-10-19 DIAGNOSIS — M7582 Other shoulder lesions, left shoulder: Secondary | ICD-10-CM | POA: Insufficient documentation

## 2023-10-19 DIAGNOSIS — M75122 Complete rotator cuff tear or rupture of left shoulder, not specified as traumatic: Secondary | ICD-10-CM | POA: Insufficient documentation

## 2023-11-03 ENCOUNTER — Encounter: Payer: Self-pay | Admitting: Family Medicine

## 2023-11-03 ENCOUNTER — Ambulatory Visit (INDEPENDENT_AMBULATORY_CARE_PROVIDER_SITE_OTHER): Admitting: Family Medicine

## 2023-11-03 VITALS — BP 114/72 | HR 92 | Temp 98.1°F | Resp 16 | Ht 60.0 in | Wt 192.7 lb

## 2023-11-03 DIAGNOSIS — R062 Wheezing: Secondary | ICD-10-CM | POA: Diagnosis not present

## 2023-11-03 DIAGNOSIS — R051 Acute cough: Secondary | ICD-10-CM

## 2023-11-03 DIAGNOSIS — J208 Acute bronchitis due to other specified organisms: Secondary | ICD-10-CM | POA: Diagnosis not present

## 2023-11-03 MED ORDER — ALBUTEROL SULFATE HFA 108 (90 BASE) MCG/ACT IN AERS: 2.0000 | INHALATION_SPRAY | Freq: Four times a day (QID) | RESPIRATORY_TRACT | 0 refills | Status: AC | PRN

## 2023-11-03 MED ORDER — BENZONATATE 100 MG PO CAPS: 100.0000 mg | ORAL_CAPSULE | Freq: Three times a day (TID) | ORAL | 0 refills | Status: DC | PRN

## 2023-11-03 MED ORDER — HYDROCOD POLI-CHLORPHE POLI ER 10-8 MG/5ML PO SUER: 5.0000 mL | Freq: Every evening | ORAL | 0 refills | Status: DC | PRN

## 2023-11-03 NOTE — Progress Notes (Signed)
 Name: Abigail Hatfield   MRN: [id number]    DOB: 1945/05/22   Date:11/03/2023       Progress Note  Subjective  Chief Complaint  Chief Complaint  Patient presents with   Cough    Sx since a week ago, no other sx   Wheezing    Discussed the use of AI scribe software for clinical note transcription with the patient, who gave verbal consent to proceed.  History of Present Illness Abigail Hatfield is a 78 year old female who presents with a week-long history of cough and wheezing.  She has experienced a week-long history of cough and wheezing that began after a trip to the beach. The symptoms started with a dry cough, followed by wheezing. The cough is persistent, occurring both during the day and at night, affecting her sleep. No phlegm production is noted, and she describes the cough as severe.  She experiences some shortness of breath, particularly when walking, and also has trouble with her back, which causes her to move slowly. She has not tried any over-the-counter medications for her symptoms.  Her past medical history includes a remote history of smoking, having quit 40 years ago. She denies any history of asthma or previous episodes of wheezing. She has environmental allergies that typically cause her to lose her voice, but this episode did not start in the usual manner associated with her allergies.  In terms of social history, she has not been in her own home for two years , stays at her mother's home and her two daughters ETTER Lister and Tillman ) go there to help her out . She reports no recent illness among family members and denies any recent exposure to sick contacts. Her appetite has not been affected, but she feels tired and lacks energy, which she attributes to the coughing and disrupted sleep.    Patient Active Problem List   Diagnosis Date Noted   Nontraumatic complete tear of left rotator cuff 10/19/2023   Rotator cuff tendinitis, left 10/19/2023   Moderate vascular dementia  with mood disturbance (HCC) 08/05/2023   Morbid obesity (HCC) 08/05/2023   Stage 3a chronic kidney disease (HCC) 08/05/2023   Hyperglycemia 09/05/2020   Low serum vitamin B12 09/05/2020   Chronic bilateral low back pain without sciatica 09/05/2020   DDD (degenerative disc disease), lumbosacral 12/07/2017   Senile purpura (HCC) 12/07/2017   Hyperlipidemia 12/21/2014    Social History   Tobacco Use   Smoking status: Former    Current packs/day: 0.00    Average packs/day: 0.5 packs/day for 5.0 years (2.5 ttl pk-yrs)    Types: Cigarettes    Start date: 12/08/1983    Quit date: 12/07/1988    Years since quitting: 34.9   Smokeless tobacco: Never   Tobacco comments:    smoking cessation materials not required  Substance Use Topics   Alcohol use: No    Alcohol/week: 0.0 standard drinks of alcohol     Current Outpatient Medications:    acetaminophen  (TYLENOL ) 325 MG tablet, Take 2 tablets (650 mg total) by mouth every 6 (six) hours as needed for mild pain (pain score 1-3) (or Fever >/= 101)., Disp: 20 tablet, Rfl: 0   cephALEXin  (KEFLEX ) 500 MG capsule, Take 1 capsule (500 mg total) by mouth 4 (four) times daily., Disp: 20 capsule, Rfl: 0   Cholecalciferol (VITAMIN D3) 50 MCG (2000 UT) capsule, Take 1 capsule (2,000 Units total) by mouth daily., Disp: 90 capsule, Rfl: 1   cyanocobalamin  (  VITAMIN B12) 1000 MCG tablet, Take 1 tablet (1,000 mcg total) by mouth daily., Disp: 90 tablet, Rfl: 1   donepezil  (ARICEPT ) 5 MG tablet, Take 5 mg by mouth at bedtime., Disp: , Rfl:    gabapentin  (NEURONTIN ) 100 MG capsule, Take 200 mg by mouth 2 (two) times daily., Disp: , Rfl:    PARoxetine  (PAXIL ) 30 MG tablet, Take 30 mg by mouth daily., Disp: , Rfl:    pyridOXINE (VITAMIN B6) 100 MG tablet, Take 100 mg by mouth daily., Disp: , Rfl:    simvastatin  (ZOCOR ) 20 MG tablet, TAKE 1 TABLET BY MOUTH AT BEDTIME, Disp: 90 tablet, Rfl: 1  Allergies  Allergen Reactions   Moxifloxacin Hcl In Nacl Swelling    Prednisone Other (See Comments)    Paranoid   Penicillins Rash and Other (See Comments)    Has patient had a PCN reaction causing immediate rash, facial/tongue/throat swelling, SOB or lightheadedness with hypotension: Unknown Has patient had a PCN reaction causing severe rash involving mucus membranes or skin necrosis: No Has patient had a PCN reaction that required hospitalization: Yes Has patient had a PCN reaction occurring within the last 10 years: No If all of the above answers are NO, then may proceed with Cephalosporin use.     ROS  Ten systems reviewed and is negative except as mentioned in HPI    Objective  Vitals:   11/03/23 1314  BP: 114/72  Pulse: 92  Resp: 16  Temp: 98.1 F (36.7 C)  TempSrc: Oral  SpO2: 95%  Weight: 192 lb 11.2 oz (87.4 kg)  Height: 5' (1.524 m)    Body mass index is 37.63 kg/m.  Physical Exam CONSTITUTIONAL: Patient appears well-developed and well-nourished. No distress. HEENT: Head atraumatic, normocephalic, neck supple. Throat normal. CARDIOVASCULAR: Normal rate, regular rhythm and normal heart sounds. No murmur heard. No BLE edema. PULMONARY: Effort normal and breath sounds normal. Lungs clear to auscultation. No respiratory distress. ABDOMINAL: There is no tenderness or distention. MUSCULOSKELETAL: Normal gait. Without gross motor or sensory deficit. PSYCHIATRIC: Patient has a normal mood and affect.   Assessment & Plan Viral bronchitis Acute viral bronchitis with dry cough and wheezing. Likely viral etiology. No asthma, only remote smoking history, or pneumonia signs.  Avoid Trelegy due to potential corticosteroid allergy. - Prescribed Tessalon  Perles for cough suppression, 1-2 capsules up to three times a day. - Prescribed nighttime cough suppressant with hydrocodone  for sleep. - Provided albuterol  inhaler for wheezing, one puff as needed. - Advised against using Trelegy due to potential corticosteroid allergy. - Instructed to  monitor symptoms and contact if cough becomes productive or fever develops. - Reassured symptoms should improve by the weekend.

## 2023-11-05 ENCOUNTER — Other Ambulatory Visit: Payer: Self-pay | Admitting: Family Medicine

## 2023-11-05 DIAGNOSIS — E7800 Pure hypercholesterolemia, unspecified: Secondary | ICD-10-CM

## 2023-11-10 ENCOUNTER — Other Ambulatory Visit: Payer: Self-pay

## 2023-11-10 ENCOUNTER — Ambulatory Visit (INDEPENDENT_AMBULATORY_CARE_PROVIDER_SITE_OTHER): Admitting: Internal Medicine

## 2023-11-10 ENCOUNTER — Ambulatory Visit
Admission: RE | Admit: 2023-11-10 | Discharge: 2023-11-10 | Disposition: A | Discharge status: Home / Self Care | Attending: Internal Medicine | Admitting: Internal Medicine

## 2023-11-10 ENCOUNTER — Encounter: Payer: Self-pay | Admitting: Internal Medicine

## 2023-11-10 ENCOUNTER — Ambulatory Visit
Admission: RE | Admit: 2023-11-10 | Discharge: 2023-11-10 | Disposition: A | Discharge status: Home / Self Care | Source: Ambulatory Visit | Attending: Internal Medicine | Admitting: Internal Medicine

## 2023-11-10 VITALS — BP 134/80 | HR 79 | Temp 97.7°F | Resp 16 | Ht 60.0 in | Wt 189.3 lb

## 2023-11-10 DIAGNOSIS — R051 Acute cough: Secondary | ICD-10-CM

## 2023-11-10 DIAGNOSIS — R5383 Other fatigue: Secondary | ICD-10-CM

## 2023-11-10 LAB — POC COVID19/FLU A&B COMBO
Covid Antigen, POC: NEGATIVE
Influenza A Antigen, POC: NEGATIVE
Influenza B Antigen, POC: NEGATIVE

## 2023-11-10 NOTE — Progress Notes (Signed)
 Acute Office Visit  Subjective:     Patient ID: MEEGHAN SKIPPER, female    DOB: Jan 04, 1946, 78 y.o.   MRN: [id number]  Chief Complaint  Patient presents with   Cough    No taste or sense of smell, rib pain    Cough Associated symptoms include wheezing. Pertinent negatives include no chills, ear pain, fever, sore throat or shortness of breath.   Patient is in today for follow up chronic cough. She is a patient of Dr. Glenard and this is my first time meeting her. She is in today with 2 friends.   Discussed the use of AI scribe software for clinical note transcription with the patient, who gave verbal consent to proceed.  History of Present Illness LYNZE REDDY is a 78 year old female who presents with persistent cough and fatigue.  She has experienced a persistent cough and fatigue for three weeks, worsening since onset at the beach. Cough medicine and an inhaler have been ineffective. She has decreased appetite, increased sleep, anosmia, and audible wheezing. She denies fever, diarrhea, or shortness of breath but has difficulty walking long distances due to fatigue. She remains mostly sedentary. Her past medical history includes a resolved urinary tract infection treated with Keflex  and a previous COVID-19 infection in April. She currently has no urinary symptoms, abdominal pain, nausea, or bowel movement changes. Recent tests for flu and COVID-19 are negative.  Review of Systems  Constitutional:  Positive for malaise/fatigue. Negative for chills and fever.  HENT:  Negative for congestion, ear pain and sore throat.   Respiratory:  Positive for cough and wheezing. Negative for sputum production and shortness of breath.   Gastrointestinal:  Negative for abdominal pain, constipation, diarrhea and nausea.  Genitourinary: Negative.         Objective:    BP 134/80 (Cuff Size: Large)   Pulse 79   Temp 97.7 F (36.5 C) (Oral)   Resp 16   Ht 5' (1.524 m)   Wt 189 lb 4.8 oz  (85.9 kg)   SpO2 95%   BMI 36.97 kg/m    Physical Exam Constitutional:      Appearance: Normal appearance. She is ill-appearing.  HENT:     Head: Normocephalic and atraumatic.     Right Ear: Tympanic membrane, ear canal and external ear normal.     Left Ear: Tympanic membrane, ear canal and external ear normal.     Nose: Nose normal.     Mouth/Throat:     Mouth: Mucous membranes are moist.     Pharynx: Oropharynx is clear.  Eyes:     Extraocular Movements: Extraocular movements intact.     Conjunctiva/sclera: Conjunctivae normal.     Pupils: Pupils are equal, round, and reactive to light.  Cardiovascular:     Rate and Rhythm: Normal rate and regular rhythm.  Pulmonary:     Effort: Pulmonary effort is normal.     Breath sounds: No wheezing, rhonchi or rales.     Comments: Decreased air sounds throughout  Skin:    General: Skin is warm and dry.  Neurological:     General: No focal deficit present.     Mental Status: She is alert. Mental status is at baseline.  Psychiatric:        Mood and Affect: Mood normal.        Behavior: Behavior normal.     Results for orders placed or performed in visit on 11/10/23  POC Covid19/Flu A&B Antigen  Result Value Ref Range   Influenza A Antigen, POC Negative Negative   Influenza B Antigen, POC Negative Negative   Covid Antigen, POC Negative Negative        Assessment & Plan:   Assessment & Plan Prolonged cough and fatigue with anosmia, anorexia, and possible pneumonia or viral illness Prolonged cough, fatigue, anosmia, and anorexia for three weeks. Initial treatment ineffective. Concern for pneumonia due to symptom duration. Differential includes persistent viral illness or bacterial pneumonia. Further investigation needed. - COVID and flu rapid tests negative in the office today. - Order chest x-ray to evaluate for pneumonia. - Perform blood work to assess white blood cell count, anemia, kidney, liver, and electrolyte function,  and specific pneumonia markers. - Communicated results to her daughter, Leobardo Coup, at phone number [phone].  - DG Chest 2 View; Future - CBC w/Diff/Platelet - Comprehensive Metabolic Panel (CMET) - Procalcitonin   Return if symptoms worsen or fail to improve.  Sharyle Fischer, DO

## 2023-11-11 ENCOUNTER — Telehealth: Payer: Self-pay

## 2023-11-11 NOTE — Telephone Encounter (Signed)
 Copied from CRM [id number]. Topic: Clinical - Medical Advice >> Nov 11, 2023 10:45 AM Winona R wrote: Pt daughter calling to ask if the pt will receive refills on her medication prescribed for her acute illness that is not getting bettter. If not, she would like to know what can she give her over the counter. Pt went to a check up yesterday

## 2023-11-12 ENCOUNTER — Telehealth: Payer: Self-pay

## 2023-11-12 NOTE — Telephone Encounter (Signed)
 Copied from CRM [id number]. Topic: Clinical - Medical Advice >> Nov 12, 2023  1:49 PM Nathanel BROCKS wrote: Reason for CRM:   Izetta Jenny PT with [facility] phone [phone]  Pt is not doing any better since her office visit, She is running out of both cough medications. She is not coughing anything up, dry cough. Can't sleep at night for coughing.   No energy and back pain (this could be from not walking)   O2 stats was 91-92 sitting,   Can you please reorder the medications from the office visit and call daughter with results.   Is it possible that she needs home health to come out? Please return call.

## 2023-11-13 ENCOUNTER — Ambulatory Visit: Payer: Self-pay | Admitting: Internal Medicine

## 2023-11-13 ENCOUNTER — Other Ambulatory Visit: Payer: Self-pay | Admitting: Internal Medicine

## 2023-11-13 DIAGNOSIS — J208 Acute bronchitis due to other specified organisms: Secondary | ICD-10-CM

## 2023-11-13 DIAGNOSIS — R051 Acute cough: Secondary | ICD-10-CM

## 2023-11-13 DIAGNOSIS — R062 Wheezing: Secondary | ICD-10-CM

## 2023-11-13 LAB — COMPREHENSIVE METABOLIC PANEL WITH GFR
AG Ratio: 1.4 (calc) (ref 1.0–2.5)
ALT: 26 U/L (ref 6–29)
AST: 31 U/L (ref 10–35)
Albumin: 3.7 g/dL (ref 3.6–5.1)
Alkaline phosphatase (APISO): 51 U/L (ref 37–153)
BUN: 18 mg/dL (ref 7–25)
CO2: 31 mmol/L (ref 20–32)
Calcium: 9.1 mg/dL (ref 8.6–10.4)
Chloride: 102 mmol/L (ref 98–110)
Creat: 0.8 mg/dL (ref 0.60–1.00)
Globulin: 2.7 g/dL (ref 1.9–3.7)
Glucose, Bld: 118 mg/dL — ABNORMAL HIGH (ref 65–99)
Potassium: 4.4 mmol/L (ref 3.5–5.3)
Sodium: 140 mmol/L (ref 135–146)
Total Bilirubin: 0.2 mg/dL (ref 0.2–1.2)
Total Protein: 6.4 g/dL (ref 6.1–8.1)
eGFR: 75 mL/min/1.73m2 (ref >=60)

## 2023-11-13 LAB — CBC WITH DIFFERENTIAL/PLATELET
Absolute Lymphocytes: 1633 {cells}/uL (ref 850–3900)
Absolute Monocytes: 419 {cells}/uL (ref 200–950)
Basophils Absolute: 71 {cells}/uL (ref 0–200)
Basophils Relative: 1 %
Eosinophils Absolute: 341 {cells}/uL (ref 15–500)
Eosinophils Relative: 4.8 %
HCT: 34.2 % — ABNORMAL LOW (ref 35.0–45.0)
Hemoglobin: 10.8 g/dL — ABNORMAL LOW (ref 11.7–15.5)
MCH: 30 pg (ref 27.0–33.0)
MCHC: 31.6 g/dL — ABNORMAL LOW (ref 32.0–36.0)
MCV: 95 fL (ref 80.0–100.0)
MPV: 10.5 fL (ref 7.5–12.5)
Monocytes Relative: 5.9 %
Neutro Abs: 4636 {cells}/uL (ref 1500–7800)
Neutrophils Relative %: 65.3 %
Platelets: 317 Thousand/uL (ref 140–400)
RBC: 3.6 Million/uL — ABNORMAL LOW (ref 3.80–5.10)
RDW: 13.4 % (ref 11.0–15.0)
Total Lymphocyte: 23 %
WBC: 7.1 Thousand/uL (ref 3.8–10.8)

## 2023-11-13 LAB — PROCALCITONIN: Procalcitonin: <0.1 ng/mL (ref <0.10)

## 2023-11-13 MED ORDER — BENZONATATE 100 MG PO CAPS
100.0000 mg | ORAL_CAPSULE | Freq: Three times a day (TID) | ORAL | 0 refills | Status: DC | PRN
Start: 2023-11-13 — End: 2024-01-05

## 2023-11-13 NOTE — Telephone Encounter (Unsigned)
 Copied from CRM [id number]. Topic: Clinical - Medical Advice >> Nov 13, 2023 11:22 AM Avram MATSU wrote: Reason for CRM: daughter is still waiting for a callback and is asking if the provider can send a refill for cough medicine or what recommendations to get something over counter.  Gina:[phone]

## 2023-11-13 NOTE — Telephone Encounter (Signed)
 Gina informed verbalized understanding. I advised if sx are getting worst to please seek medical attention. Imaging results still not back yet.

## 2023-11-13 NOTE — Telephone Encounter (Signed)
 Dr.Andrews has sent Tessalon . Multiple encounters

## 2023-11-13 NOTE — Telephone Encounter (Signed)
 Spoke to patient daughter and she states pt is not any better, feels sx are getting worst especially cough. She is requesting more cough prescribed medication. I did advised if pt not feeling any better she should be seen again. Please advise if medication can be refilled or does she have to be seen again.

## 2023-11-16 ENCOUNTER — Telehealth: Payer: Self-pay

## 2023-11-16 NOTE — Telephone Encounter (Signed)
 VO given to [employer]

## 2023-11-16 NOTE — Telephone Encounter (Signed)
 Spoke to patient regarding her cxr. Patient stated she still has a cough, hoarness in voice, sob. This has been 3 weeks.

## 2023-11-16 NOTE — Telephone Encounter (Signed)
 Copied from CRM [id number]. Topic: Clinical - Home Health Verbal Orders >> Nov 16, 2023  9:44 AM Nathanel BROCKS wrote: Caller/Agency: [facility]yman with [facility] Number:[phone] Service Requested:  e4xtend orders forPhysical Therapy Frequency: 2w2 1w1 Any new concerns about the patient? No

## 2023-12-10 ENCOUNTER — Ambulatory Visit: Payer: Self-pay

## 2023-12-10 NOTE — Telephone Encounter (Signed)
 This RN attempted to contact patient for triage. No answer, voicemail left requesting return call to clinic.    Copied from CRM [id number]. Topic: Clinical - Medication Question >> Dec 10, 2023  2:46 PM Santiya F wrote: Reason for CRM: Patient's daughter Tobias is calling in because patient is beginning to have a cough again. Tobias says she sits outside a lot so she doesn't know if it's because of allergies or a cold and she wants to know if some medication can be sent in for patient. Please advise.

## 2023-12-10 NOTE — Telephone Encounter (Signed)
 FYI Only or Action Required?: Action required by provider: medication refill request.  Patient was last seen in primary care on 11/10/2023 by Bernardo Fend, DO.  Called Nurse Triage reporting Cough.  Symptoms began yesterday.  Interventions attempted: OTC medications: Cough and Rest, hydration, or home remedies.  Symptoms are: gradually worsening.  Triage Disposition: See Physician Within 24 Hours  Patient/caregiver understands and will follow disposition?: Yes      Reason for Disposition  [1] Continuous (nonstop) coughing interferes with work or school AND [2] no improvement using cough treatment per Care Advice  Answer Assessment - Initial Assessment Questions Additional info:  Patient had been dealing with a persistent cough for over one month, she has had xray and evaluation, cough resolved couple weeks ago. Daughter calling today because cough has returned, requesting appointment and cough medication-Tussinex was very helpful.  Acute visit scheduled on 12/11/23 with an alternate provider.     1. ONSET: When did the cough begin?      Began again last night  2. SEVERITY: How bad is the cough today?      Kept her up all night last night 3. SPUTUM: Describe the color of your sputum (e.g., none, dry cough; clear, white, yellow, green)     dry 4. HEMOPTYSIS: Are you coughing up any blood? If Yes, ask: How much? (e.g., flecks, streaks, tablespoons, etc.)     na 5. DIFFICULTY BREATHING: Are you having difficulty breathing? If Yes, ask: How bad is it? (e.g., mild, moderate, severe)      denies 6. FEVER: Do you have a fever? If Yes, ask: What is your temperature, how was it measured, and when did it start?     denies 7. CARDIAC HISTORY: Do you have any history of heart disease? (e.g., heart attack, congestive heart failure)       8. LUNG HISTORY: Do you have any history of lung disease?  (e.g., pulmonary embolus, asthma, emphysema)      9. PE RISK  FACTORS: Do you have a history of blood clots? (or: recent major surgery, recent prolonged travel, bedridden)      10. OTHER SYMPTOMS: Do you have any other symptoms? (e.g., runny nose, wheezing, chest pain)      Difficulty sleeping due to cough.  Protocols used: Cough - Acute Non-Productive-A-AH

## 2023-12-11 ENCOUNTER — Encounter: Payer: Self-pay | Admitting: Nurse Practitioner

## 2023-12-11 ENCOUNTER — Ambulatory Visit (INDEPENDENT_AMBULATORY_CARE_PROVIDER_SITE_OTHER): Admitting: Nurse Practitioner

## 2023-12-11 VITALS — BP 128/82 | HR 90 | Temp 98.0°F | Resp 18 | Ht 60.0 in | Wt 188.3 lb

## 2023-12-11 DIAGNOSIS — R059 Cough, unspecified: Secondary | ICD-10-CM

## 2023-12-11 MED ORDER — BENZONATATE 100 MG PO CAPS: 100.0000 mg | ORAL_CAPSULE | Freq: Two times a day (BID) | ORAL | 0 refills | Status: DC | PRN

## 2023-12-11 MED ORDER — CETIRIZINE HCL 10 MG PO TABS
10.0000 mg | ORAL_TABLET | Freq: Every day | ORAL | 11 refills | Status: AC
Start: 2023-12-11 — End: ?

## 2023-12-11 MED ORDER — FAMOTIDINE 20 MG PO TABS: 20.0000 mg | ORAL_TABLET | Freq: Two times a day (BID) | ORAL | 0 refills | Status: DC

## 2023-12-11 NOTE — Progress Notes (Signed)
 BP 128/82 (Cuff Size: Large)   Pulse 90   Temp 98 F (36.7 C) (Oral)   Resp 18   Ht 5' (1.524 m)   Wt 188 lb 4.8 oz (85.4 kg)   SpO2 96%   BMI 36.77 kg/m    Subjective:    Patient ID: Abigail Hatfield, female    DOB: 1945-11-16, 78 y.o.   MRN: [id number]  HPI: Abigail Hatfield is a 78 y.o. female presenting today for a persistent cough x 1 month. Per nurse triage note, patient had xray and evaluation, cough resolved a couple weeks ago. Daughter now concerned because cough has returned and is worse at night.   X ray on 11/10/23 showed no signs of pneumonia or other abnormalities.   Cough: -cough had improved but restarted again last night (12/10/23) -reports cough kept her up all night (dry cough) -denies difficulty breathing  -denies wheezing  -denies chest pain  -reports rib pain when coughing, but relieved when coughing stops   GERD: -Daughter reports patient has a hx of GERD but not on any medication. Daughter reports that she tries to avoid food triggers.             11/03/2023    1:07 PM 08/05/2023   10:02 AM 02/19/2023   10:53 AM  Depression screen PHQ 2/9  Decreased Interest 0 1 0  Down, Depressed, Hopeless 0 1 0  PHQ - 2 Score 0 2 0  Altered sleeping 0 0 0  Tired, decreased energy 0 0 0  Change in appetite 0 0 0  Feeling bad or failure about yourself  0 0 0  Trouble concentrating 0 0 0  Moving slowly or fidgety/restless 0 0 0  Suicidal thoughts 0 0 0  PHQ-9 Score 0 2 0  Difficult doing work/chores Not difficult at all Somewhat difficult Not difficult at all    Relevant past medical, surgical, family and social history reviewed and updated as indicated. Interim medical history since our last visit reviewed. Allergies and medications reviewed and updated.  Review of Systems Constitutional: Negative for fever or weight change.  Respiratory: Positive for cough. Negative for shortness of breath.   Cardiovascular: Negative for chest pain or palpitations.   Gastrointestinal: Negative for abdominal pain, no bowel changes.  Musculoskeletal:Negative for gait problem or joint swelling.  Skin: Negative for rash.  Neurological: Negative for dizziness or headache.   No other specific complaints in a complete review of systems (except as listed in HPI above).      Objective:     BP 128/82 (Cuff Size: Large)   Pulse 90   Temp 98 F (36.7 C) (Oral)   Resp 18   Ht 5' (1.524 m)   Wt 188 lb 4.8 oz (85.4 kg)   SpO2 96%   BMI 36.77 kg/m    Wt Readings from Last 3 Encounters:  12/11/23 188 lb 4.8 oz (85.4 kg)  11/10/23 189 lb 4.8 oz (85.9 kg)  11/03/23 192 lb 11.2 oz (87.4 kg)    Physical Exam Constitutional:      Appearance: Normal appearance.  Cardiovascular:     Rate and Rhythm: Normal rate and regular rhythm.     Pulses: Normal pulses.     Heart sounds: Normal heart sounds.  Pulmonary:     Effort: Pulmonary effort is normal.     Breath sounds: Normal breath sounds. No wheezing.  Skin:    General: Skin is warm and dry.  Neurological:  General: No focal deficit present.     Mental Status: She is alert. Mental status is at baseline.     Comments: Hx of dementia       Results for orders placed or performed in visit on 11/10/23  POC Covid19/Flu A&B Antigen   Collection Time: 11/10/23  2:04 PM  Result Value Ref Range   Influenza A Antigen, POC Negative Negative   Influenza B Antigen, POC Negative Negative   Covid Antigen, POC Negative Negative  CBC w/Diff/Platelet   Collection Time: 11/10/23  2:22 PM  Result Value Ref Range   WBC 7.1 3.8 - 10.8 Thousand/uL   RBC 3.60 (L) 3.80 - 5.10 Million/uL   Hemoglobin 10.8 (L) 11.7 - 15.5 g/dL   HCT 65.7 (L) 64.9 - 54.9 %   MCV 95.0 80.0 - 100.0 fL   MCH 30.0 27.0 - 33.0 pg   MCHC 31.6 (L) 32.0 - 36.0 g/dL   RDW 86.5 88.9 - 84.9 %   Platelets 317 140 - 400 Thousand/uL   MPV 10.5 7.5 - 12.5 fL   Neutro Abs 4,636 1,500 - 7,800 cells/uL   Absolute Lymphocytes 1,633 850 - 3,900  cells/uL   Absolute Monocytes 419 200 - 950 cells/uL   Eosinophils Absolute 341 15 - 500 cells/uL   Basophils Absolute 71 0 - 200 cells/uL   Neutrophils Relative % 65.3 %   Total Lymphocyte 23.0 %   Monocytes Relative 5.9 %   Eosinophils Relative 4.8 %   Basophils Relative 1.0 %  Comprehensive Metabolic Panel (CMET)   Collection Time: 11/10/23  2:22 PM  Result Value Ref Range   Glucose, Bld 118 (H) 65 - 99 mg/dL   BUN 18 7 - 25 mg/dL   Creat 9.19 9.39 - 8.99 mg/dL   eGFR 75 > OR = 60 fO/fpw/8.26f7   BUN/Creatinine Ratio SEE NOTE: 6 - 22 (calc)   Sodium 140 135 - 146 mmol/L   Potassium 4.4 3.5 - 5.3 mmol/L   Chloride 102 98 - 110 mmol/L   CO2 31 20 - 32 mmol/L   Calcium 9.1 8.6 - 10.4 mg/dL   Total Protein 6.4 6.1 - 8.1 g/dL   Albumin 3.7 3.6 - 5.1 g/dL   Globulin 2.7 1.9 - 3.7 g/dL (calc)   AG Ratio 1.4 1.0 - 2.5 (calc)   Total Bilirubin 0.2 0.2 - 1.2 mg/dL   Alkaline phosphatase (APISO) 51 37 - 153 U/L   AST 31 10 - 35 U/L   ALT 26 6 - 29 U/L  Procalcitonin   Collection Time: 11/10/23  2:22 PM  Result Value Ref Range   Procalcitonin <0.10 <0.10 ng/mL          Assessment & Plan:   Problem List Items Addressed This Visit   None Visit Diagnoses       Cough, unspecified type    -  Primary   Begin taking Zyrtec  10mg  once daily and Famotidine  20mg  twice daily. Tessalon  perles sent to pharmamcy (100mg  twice daily as needed).   Relevant Medications   cetirizine  (ZYRTEC ) 10 MG tablet   benzonatate  (TESSALON ) 100 MG capsule   famotidine  (PEPCID ) 20 MG tablet        -Due to length of cough and unremarkable X-ray and lab work, differential diagnosis includes cough related to seasonal allergies and possible GERD.  -Zyrtec  10 mg sent to pharmacy (take 1 tablet by mouth once a day) -Famotidine  20 mg sent to pharmacy (take 1 tablet twice a day)  -  Tessalon  Perles 100 mg sent to pharmacy (take twice daily as needed for cough)  -Discussed avoiding triggers such as dry  air -Advised humidifier in room  -Advised sitting propped up at night to help with cough         Follow up plan: Return if symptoms worsen or fail to improve.   I have reviewed this encounter including the documentation in this note and/or discussed this patient with the provider, Aislinn Womack, SNP, I am certifying that I agree with the content of this note as supervising/preceptor nurse practitioner.  Mliss Spray, FNP-C [facility] 12/11/2023, 12:18 PM

## 2023-12-24 ENCOUNTER — Telehealth: Payer: Self-pay

## 2023-12-24 NOTE — Telephone Encounter (Signed)
 Spoke to [place], pt daughter, advised same information as previously documented. Pt daughter will got to store if needed to get one or contact previous store she received it from if she can find information.

## 2023-12-24 NOTE — Telephone Encounter (Signed)
 Copied from CRM #[id number]. Topic: Clinical - Order For Equipment >> Dec 24, 2023 12:31 PM Kevelyn M wrote: Reason for CRM: Patient is in need of tens unit for back. She misplaced and needs another one orderer ir or prescribed. Not sure how the process works. Please advise.   Call back # [phone]

## 2023-12-24 NOTE — Telephone Encounter (Signed)
 Called pt back no answer left detailed vm stating to call back for further details. If we did not order we can not order it for her. Pt can contact store where she got it from and ask for advice on what to do.

## 2024-01-05 ENCOUNTER — Other Ambulatory Visit: Payer: Self-pay

## 2024-01-05 ENCOUNTER — Ambulatory Visit: Admitting: Family Medicine

## 2024-01-05 ENCOUNTER — Ambulatory Visit: Admitting: Internal Medicine

## 2024-01-05 ENCOUNTER — Encounter: Payer: Self-pay | Admitting: Internal Medicine

## 2024-01-05 VITALS — BP 130/80 | HR 86 | Temp 98.1°F | Resp 16 | Ht 60.0 in | Wt 190.2 lb

## 2024-01-05 DIAGNOSIS — R062 Wheezing: Secondary | ICD-10-CM | POA: Diagnosis not present

## 2024-01-05 DIAGNOSIS — R059 Cough, unspecified: Secondary | ICD-10-CM

## 2024-01-05 MED ORDER — BENZONATATE 100 MG PO CAPS: 100.0000 mg | ORAL_CAPSULE | Freq: Three times a day (TID) | ORAL | 0 refills | Status: DC | PRN

## 2024-01-05 MED ORDER — IPRATROPIUM-ALBUTEROL 0.5-2.5 (3) MG/3ML IN SOLN
3.0000 mL | Freq: Once | RESPIRATORY_TRACT | Status: AC
  Administered 2024-01-05: 3 mL via RESPIRATORY_TRACT

## 2024-01-05 MED ORDER — DOXYCYCLINE HYCLATE 100 MG PO TABS: 100.0000 mg | ORAL_TABLET | Freq: Two times a day (BID) | ORAL | 0 refills | Status: AC

## 2024-01-05 NOTE — Progress Notes (Signed)
 Acute Office Visit  Subjective:     Patient ID: Abigail Hatfield, female    DOB: 05/23/1945, 78 y.o.   MRN: [id number]  Chief Complaint  Patient presents with   Cough    Productive cough yellow mucous    Cough Pertinent negatives include no chills, fever, shortness of breath or wheezing.   Patient is in today for cough now for 2 months.   Discussed the use of AI scribe software for clinical note transcription with the patient, who gave verbal consent to proceed.  History of Present Illness Abigail Hatfield is a 78 year old female who presents with a chronic cough persisting for two months.  She experiences a persistent cough that temporarily improves but recurs. Zyrtec  has been used since a previous visit without resolution. A chest x-ray and lab work from late July or early August were normal. She has not received antibiotics as the cough was considered viral.  She denies symptoms of acid reflux, despite a family history of similar cough attributed to it. Allergies to prednisone and penicillins limit treatment options. An albuterol  inhaler provides relief during coughing spells. She experiences severe coughing episodes but no shortness of breath.  Over-the-counter cough medicine and Tessalon  Perles have been used, with Tessalon  Perles providing some relief. She has consumed multiple bottles of cough medicine over the past three weeks.   Review of Systems  Constitutional:  Negative for chills and fever.  Respiratory:  Positive for cough. Negative for sputum production, shortness of breath and wheezing.         Objective:    BP 130/80 (Cuff Size: Large)   Pulse 86   Temp 98.1 F (36.7 C) (Oral)   Resp 16   Ht 5' (1.524 m)   Wt 190 lb 3.2 oz (86.3 kg)   SpO2 97%   BMI 37.15 kg/m  BP Readings from Last 3 Encounters:  01/05/24 130/80  12/11/23 128/82  11/10/23 134/80   Wt Readings from Last 3 Encounters:  01/05/24 190 lb 3.2 oz (86.3 kg)  12/11/23 188 lb 4.8 oz  (85.4 kg)  11/10/23 189 lb 4.8 oz (85.9 kg)      Physical Exam Constitutional:      Appearance: Normal appearance.  HENT:     Head: Normocephalic and atraumatic.  Eyes:     Conjunctiva/sclera: Conjunctivae normal.  Cardiovascular:     Rate and Rhythm: Normal rate and regular rhythm.  Pulmonary:     Effort: Pulmonary effort is normal.     Breath sounds: Wheezing present. No rhonchi or rales.     Comments: Expiratory wheezes in the right lung Skin:    General: Skin is warm and dry.  Neurological:     General: No focal deficit present.     Mental Status: She is alert. Mental status is at baseline.  Psychiatric:        Mood and Affect: Mood normal.        Behavior: Behavior normal.     No results found for any visits on 01/05/24.      Assessment & Plan:   Assessment & Plan Chronic cough  Chronic cough for two months with normal chest x-ray and labs. Differential includes silent acid reflux, sinus drainage, and asthma. Current treatment includes Zyrtec  and famotidine . Wheezing noted, suggesting possible asthma. - Prescribe doxycycline  100 mg twice daily for 7 days. - Advise taking doxycycline  with food and wearing protective clothing to avoid photosensitivity. - Refill Tessalon  Perles for cough suppression. -  Instruct to use albuterol  inhaler every 4-6 hours as needed. - Administer in-office nebulized breathing treatment. - Refer to pulmonologist if symptoms persist post-antibiotic.  Penicillin and prednisone allergy Allergy to penicillin and prednisone noted. - Doxycycline  selected as alternative antibiotic.  - doxycycline  (VIBRA -TABS) 100 MG tablet; Take 1 tablet (100 mg total) by mouth 2 (two) times daily for 7 days.  Dispense: 14 tablet; Refill: 0 - benzonatate  (TESSALON ) 100 MG capsule; Take 1-2 capsules (100-200 mg total) by mouth 3 (three) times daily as needed for cough.  Dispense: 40 capsule; Refill: 0 - ipratropium-albuterol  (DUONEB) 0.5-2.5 (3) MG/3ML  nebulizer solution 3 mL  Return if symptoms worsen or fail to improve, for already scheduled.  Sharyle Fischer, DO

## 2024-01-14 ENCOUNTER — Telehealth: Payer: Self-pay

## 2024-01-14 NOTE — Telephone Encounter (Signed)
 Copied from CRM #[id number]. Topic: Clinical - Medication Question >> Jan 13, 2024 11:29 AM Nathanel BROCKS wrote: Reason for CRM: daughter, Tobias called and would like a call back, [phone]  Can the pt take xyzal over the counter medication?  Pt uses laxative over the counter often, daughter Tobias wants to know if she has been to a gastrointestinal dr? She is concerned about how many laxatives that she is.

## 2024-01-14 NOTE — Telephone Encounter (Signed)
 Daughter does not know the name of laxatives and scheduled an appt with Dr.Andrews next week to have her check for constipation.

## 2024-01-18 ENCOUNTER — Ambulatory Visit: Payer: Self-pay

## 2024-01-18 ENCOUNTER — Encounter: Payer: Self-pay | Admitting: Family Medicine

## 2024-01-18 NOTE — Telephone Encounter (Signed)
 This RN attempted to contact pt, 2nd attempt, left vm message.

## 2024-01-18 NOTE — Telephone Encounter (Signed)
 FYI Only or Action Required?: Action required by provider: update on patient condition.  Patient was last seen in primary care on 01/05/2024 by Bernardo Fend, DO.  Called Nurse Triage reporting Rash.  Symptoms began several days ago.  Interventions attempted: Other: soap and water/neosporin.  Symptoms are: gradually worsening.  Triage Disposition: See PCP When Office is Open (Within 3 Days)  Patient/caregiver understands and will follow disposition?: Yes  Copied from CRM #[id number]. Topic: Clinical - Medical Advice >> Jan 18, 2024 12:18 PM Darshell M wrote: Reason for CRM:  Patient has nodules that started out as red spots that were believed to be zits last Wednesday. Today spots are darker and look to be developing scabs. Patient has appointment Friday. Can this wait until Thursday. CB# [phone]. Reason for Disposition  [1] Pimples (localized) AND Domitilla.Distel ] no improvement after using Care Advice  Answer Assessment - Initial Assessment Questions 1. APPEARANCE of RASH: What does the rash look like? (e.g., blisters, dry flaky skin, red spots, redness, sores)     Red nodules that had initially looked like a pimple, but now are now more flat and red with a small scab Appearance has changed since onset, but no new areas have erupted Denies drainage.   Treating with neosporin  2. LOCATION: Where is the rash located?      Left face and under mouth 3. NUMBER: How many spots are there?      Six near her mouth 4. SIZE: How big are the spots? (e.g., inches, cm; or compare to size of pinhead, tip of pen, eraser, pea)      Smaller than a dime 5. ONSET: When did the rash start?      Five days ago 6. ITCHING: Does the rash itch? If Yes, ask: How bad is the itch?  (Scale 0-10; or none, mild, moderate, severe)     Mildly itchy 7. PAIN: Does the rash hurt? If Yes, ask: How bad is the pain?  (Scale 0-10; or none, mild, moderate, severe)     Denies pain 8. OTHER SYMPTOMS: Do  you have any other symptoms? (e.g., fever)     Denies fever 9. PREGNANCY: Is there any chance you are pregnant? When was your last menstrual period?     N/A  Protocols used: Rash or Redness - Localized-A-AH

## 2024-01-18 NOTE — Telephone Encounter (Signed)
 First attempt: LVM for patient to return call to [phone]  Copied from CRM #[id number]. Topic: Clinical - Medical Advice >> Jan 18, 2024 12:18 PM Abigail Hatfield wrote: Reason for CRM:  Patient has nodules that started out as red spots that were believed to be zits last Wednesday. Today spots are darker and look to be developing scabs. Patient has appointment Friday. Can this wait until Thursday. CB# [phone].

## 2024-01-21 ENCOUNTER — Ambulatory Visit (INDEPENDENT_AMBULATORY_CARE_PROVIDER_SITE_OTHER): Admitting: Internal Medicine

## 2024-01-21 ENCOUNTER — Encounter: Payer: Self-pay | Admitting: Internal Medicine

## 2024-01-21 VITALS — BP 130/76 | HR 62 | Resp 16 | Ht 60.0 in | Wt 190.6 lb

## 2024-01-21 DIAGNOSIS — K5904 Chronic idiopathic constipation: Secondary | ICD-10-CM | POA: Diagnosis not present

## 2024-01-21 DIAGNOSIS — L0100 Impetigo, unspecified: Secondary | ICD-10-CM

## 2024-01-21 DIAGNOSIS — F01B3 Vascular dementia, moderate, with mood disturbance: Secondary | ICD-10-CM

## 2024-01-21 DIAGNOSIS — F419 Anxiety disorder, unspecified: Secondary | ICD-10-CM

## 2024-01-21 MED ORDER — MUPIROCIN 2 % EX OINT: 1.0000 | TOPICAL_OINTMENT | Freq: Two times a day (BID) | CUTANEOUS | 0 refills | Status: AC

## 2024-01-21 MED ORDER — PAROXETINE HCL 40 MG PO TABS: 40.0000 mg | ORAL_TABLET | ORAL | 3 refills | Status: AC

## 2024-01-21 NOTE — Progress Notes (Signed)
 Acute Office Visit  Subjective:     Patient ID: Abigail Hatfield, female    DOB: 1945/09/01, 78 y.o.   MRN: [id number]  Chief Complaint  Patient presents with   Constipation    Pt uses about once a month laxatives, does not do regular BM on a daily on going for weeks    Constipation Pertinent negatives include no abdominal pain, fever or melena.   Patient is in today for constipation and depression.   Discussed the use of AI scribe software for clinical note transcription with the patient, who gave verbal consent to proceed.  History of Present Illness Abigail Hatfield is a 78 year old female who presents with skin lesions and sleep disturbances.  She has developed skin lesions on her face and head, starting as small hard papules that become erythematous without forming pustules. Scratching has led to excoriation. She initially used Neosporin, then switched to [facility], following a pharmacist's advice to use Neosporin. The lesions are concerning due to their appearance and potential for infection.  She experiences significant sleep disturbances, staying awake until early morning and waking after only a few hours. Occasionally, she remains awake for several nights and may sleep all day afterward. Her sleep pattern is erratic, affecting her daily activities and concerning her caregiver.  She has persistent constipation, using treatments like Miralax and other stimulant-free laxatives without immediate relief. It sometimes takes days for these to work, impacting her daily life.  She takes paroxetine  30 mg and buspirone 10 mg twice daily, with an additional dose as needed, yet continues to experience anxiety symptoms, including hand tremors and internal anxiety. Her caregiver notes she sometimes appears fine outwardly but feels anxious internally.   Review of Systems  Constitutional:  Negative for fever.  Gastrointestinal:  Positive for constipation. Negative for abdominal pain,  blood in stool and melena.  Psychiatric/Behavioral:  The patient is nervous/anxious and has insomnia.         Objective:    BP 130/76   Pulse 62   Resp 16   Ht 5' (1.524 m)   Wt 190 lb 9.6 oz (86.5 kg)   SpO2 95%   BMI 37.22 kg/m  BP Readings from Last 3 Encounters:  01/21/24 130/76  01/05/24 130/80  12/11/23 128/82   Wt Readings from Last 3 Encounters:  01/21/24 190 lb 9.6 oz (86.5 kg)  01/05/24 190 lb 3.2 oz (86.3 kg)  12/11/23 188 lb 4.8 oz (85.4 kg)      Physical Exam Constitutional:      Appearance: Normal appearance.  HENT:     Head: Normocephalic and atraumatic.  Eyes:     Conjunctiva/sclera: Conjunctivae normal.  Cardiovascular:     Rate and Rhythm: Normal rate and regular rhythm.  Pulmonary:     Effort: Pulmonary effort is normal.     Breath sounds: Normal breath sounds.  Abdominal:     General: Bowel sounds are normal. There is no distension.     Palpations: Abdomen is soft.     Tenderness: There is no abdominal tenderness. There is no guarding or rebound.  Skin:    General: Skin is warm and dry.     Findings: Rash present.     Comments: Impetigo on chin and face  Neurological:     General: No focal deficit present.     Mental Status: She is alert. Mental status is at baseline.  Psychiatric:        Mood and Affect: Mood  normal.        Behavior: Behavior normal.     No results found for any visits on 01/21/24.      Assessment & Plan:   Assessment & Plan Constipation Chronic constipation with intermittent flares. Current osmotic laxatives like Miralax are ineffective for immediate relief. No obstruction noted. - Encourage increased water and fiber intake. - Use Miralax or magnesium citrate during flares. - If ineffective, use stool softeners like Colace or Dulcolax. - Consider suppository or enema if still ineffective. - Provide written instructions for constipation management.  Excoriation and secondary skin infection of face and  scalp Excoriation and secondary infection due to scratching. Lesions appear inflammatory with risk of staph infection. - Prescribe topical antibiotic for facial lesions. - Advise to avoid scratching to prevent worsening and potential staph infection.  Anxiety and depression Anxiety and depression with paroxetine  30 mg and Buspar. Symptoms include internal anxiety, hand tremors, and mood instability. Discussed increasing paroxetine  and using trazodone for sleep with caution for serotonin syndrome. - Increase paroxetine  to 40 mg to improve mood stability. - Discuss trazodone for sleep with caution for serotonin syndrome. - Consider gene site testing for personalized medication options if current treatment is ineffective. - Discuss with neurologist if further medication adjustments are needed.  Insomnia Chronic insomnia with difficulty maintaining sleep. Current medications include paroxetine  and Buspar, but sleep disturbances persist. Discussed trazodone as a potential option for sleep if paroxetine  increase is ineffective. - Consider trazodone for sleep if paroxetine  increase is ineffective, with caution for serotonin syndrome. - Monitor sleep patterns and effectiveness of medication adjustments.   - mupirocin ointment (BACTROBAN) 2 %; Apply 1 Application topically 2 (two) times daily for 5 days.  Dispense: 10 g; Refill: 0 - PARoxetine  (PAXIL ) 40 MG tablet; Take 1 tablet (40 mg total) by mouth every morning.  Dispense: 30 tablet; Refill: 3   Return for already scheduled.  Sharyle Fischer, DO

## 2024-01-21 NOTE — Patient Instructions (Addendum)
 Constipation, Adult Constipation is when a person has trouble pooping (having a bowel movement). When you have this condition, you may poop fewer than 3 times a week. Your poop (stool) may also be dry, hard, or bigger than normal. Follow these instructions at home: Eating and drinking  Eat foods that have a lot of fiber, such as: Fresh fruits and vegetables. Whole grains. Beans. Eat less of foods that are low in fiber and high in fat and sugar, such as: [place] fries. Hamburgers. Cookies. Candy. Soda. Drink enough fluid to keep your pee (urine) pale yellow. General instructions Exercise regularly or as told by your doctor. Try to do 150 minutes of exercise each week. Go to the restroom when you feel like you need to poop. Do not hold it in. Take over-the-counter and prescription medicines only as told by your doctor. These include any fiber supplements. When you poop: Do deep breathing while relaxing your lower belly (abdomen). Relax your pelvic floor. The pelvic floor is a group of muscles that support the rectum, bladder, and intestines (as well as the uterus in women). Watch your condition for any changes. Tell your doctor if you notice any. Keep all follow-up visits as told by your doctor. This is important. Contact a doctor if: You have pain that gets worse. You have a fever. You have not pooped for 4 days. You vomit. You are not hungry. You lose weight. You are bleeding from the opening of the butt (anus). You have thin, pencil-like poop. Get help right away if: You have a fever, and your symptoms suddenly get worse. You leak poop or have blood in your poop. Your belly feels hard or bigger than normal (bloated). You have very bad belly pain. You feel dizzy or you faint. Summary Constipation is when a person poops fewer than 3 times a week, has trouble pooping, or has poop that is dry, hard, or bigger than normal. Eat foods that have a lot of fiber. Drink enough fluid  to keep your pee (urine) pale yellow. Take over-the-counter and prescription medicines only as told by your doctor. These include any fiber supplements. This information is not intended to replace advice given to you by your health care provider. Make sure you discuss any questions you have with your health care provider. Document Revised: 02/12/2022 Document Reviewed: 02/12/2022 Elsevier Patient Education  2024 [employer]vier Inc.  Be sure to increase hydration and fiber in the diet. For constipation flares, start with Miralax but use enough to have 1 soft BM a day. If this does not work, can then use stool softeners like Colace or Doculax as needed. If still constipated then can do a suppository or edema.

## 2024-02-05 ENCOUNTER — Ambulatory Visit: Admitting: Family Medicine

## 2024-02-09 ENCOUNTER — Ambulatory Visit: Admitting: Family Medicine

## 2024-02-25 ENCOUNTER — Ambulatory Visit: Payer: PPO

## 2024-02-25 DIAGNOSIS — Z0000 Encounter for general adult medical examination without abnormal findings: Secondary | ICD-10-CM | POA: Diagnosis not present

## 2024-02-25 DIAGNOSIS — Z1231 Encounter for screening mammogram for malignant neoplasm of breast: Secondary | ICD-10-CM

## 2024-02-25 NOTE — Patient Instructions (Addendum)
 Abigail Hatfield,  Thank you for taking the time for your Medicare Wellness Visit. I appreciate your continued commitment to your health goals. Please review the care plan we discussed, and feel free to reach out if I can assist you further.  Please note that Annual Wellness Visits do not include a physical exam. Some assessments may be limited, especially if the visit was conducted virtually. If needed, we may recommend an in-person follow-up with your provider.  Ongoing Care Seeing your primary care provider every 3 to 6 months helps us  monitor your health and provide consistent, personalized care.   Referrals If a referral was made during today's visit and you haven't received any updates within two weeks, please contact the referred provider directly to check on the status. REFERRAL SENT FOR MAMMOGRAM You have an order for:  []   2D Mammogram  [x]   3D Mammogram  []   Bone Density     Please call for appointment:  [facility]  [address] [place] [address] [phone] [facility] [address] [place], [place] [address] [phone] [facility] [address] [place], [place] [address] [phone]   Make sure to wear two-piece clothing.  No lotions, powders, or deodorants the day of the appointment. Make sure to bring picture ID and insurance card.  Bring list of medications you are currently taking including any supplements.   Schedule your  screening mammogram through MyChart!   Log into your MyChart account.  Go to 'Visit' (or 'Appointments' if on mobile App) --> Schedule an Appointment  Under 'Select a Reason for Visit' choose the Mammogram Screening option.  Complete the pre-visit questions and select the time and place that best fits your schedule.    Recommended Screenings:  Health Maintenance  Topic Date Due   COVID-19  Vaccine (1) Never done   Zoster (Shingles) Vaccine (1 of 2) Never done   Flu Shot  Never done   Pneumococcal Vaccine for age over 30 (1 of 2 - PCV) 10/07/2024*   Colon Cancer Screening  10/07/2024*   Breast Cancer Screening  06/25/2024   Medicare Annual Wellness Visit  02/24/2025   DEXA scan (bone density measurement)  04/01/2026   Hepatitis C Screening  Completed   Meningitis B Vaccine  Aged Out   DTaP/Tdap/Td vaccine  Discontinued  *Topic was postponed. The date shown is not the original due date.     Vision: Annual vision screenings are recommended for early detection of glaucoma, cataracts, and diabetic retinopathy. These exams can also reveal signs of chronic conditions such as diabetes and high blood pressure.  Dental: Annual dental screenings help detect early signs of oral cancer, gum disease, and other conditions linked to overall health, including heart disease and diabetes.  Please see the attached documents for additional preventive care recommendations.   NEXT AWV 03/02/25 @ 11:30 AM BY VIDEO Take care & I will see ya next year/Xzaiver Vayda

## 2024-02-25 NOTE — Progress Notes (Signed)
 No chief complaint on file.    Subjective:   Abigail Hatfield is a 77 y.o. female who presents for a Medicare Annual Wellness Visit.  Allergies (verified) Moxifloxacin hcl in nacl, Prednisone, and Penicillins   History: Past Medical History:  Diagnosis Date   Allergy    Anxiety    Cataract    Depression    Hyperlipidemia    Medical history non-contributory    Past Surgical History:  Procedure Laterality Date   ABDOMINAL HYSTERECTOMY  1980   CATARACT EXTRACTION W/PHACO Right 06/24/2017   Procedure: CATARACT EXTRACTION PHACO AND INTRAOCULAR LENS PLACEMENT (IOC);  Surgeon: Jaye Fallow, MD;  Location: [facility] ORS;  Service: Ophthalmology;  Laterality: Right;  US  00:48.0 AP% 16.5 CDE 7.92 Fluid Pack Lot # [id number]   CATARACT EXTRACTION W/PHACO Left 07/14/2017   Procedure: CATARACT EXTRACTION PHACO AND INTRAOCULAR LENS PLACEMENT (IOC);  Surgeon: Jaye Fallow, MD;  Location: [facility] ORS;  Service: Ophthalmology;  Laterality: Left;  US  00:36.0 AP% 16.9 CDE 6.08 Fluid Pack Lot # [id number]   CESAREAN SECTION  1967   CESAREAN SECTION  1971   CHOLECYSTECTOMY  1972   COLONOSCOPY WITH PROPOFOL  N/A 12/24/2017   Procedure: COLONOSCOPY WITH PROPOFOL ;  Surgeon: Therisa Bi, MD;  Location: [facility] ENDOSCOPY;  Service: Gastroenterology;  Laterality: N/A;   EYE SURGERY     retal fistula repair     Dr. Dellie x 2   Family History  Problem Relation Age of Onset   Diabetes Mother    Hypertension Mother    Heart attack Mother    Heart failure Father    Heart disease Father    Diabetes Brother    Hypertension Brother    COPD Brother    Diabetes Sister    Breast cancer Neg Hx    Social History   Occupational History   Occupation: Retired    Comment: since 04/2008  Tobacco Use   Smoking status: Former    Current packs/day: 0.00    Average packs/day: 0.5 packs/day for 5.0 years (2.5 ttl pk-yrs)    Types: Cigarettes    Start date: 12/08/1983    Quit date: 12/07/1988    Years since  quitting: 35.2   Smokeless tobacco: Never   Tobacco comments:    smoking cessation materials not required  Vaping Use   Vaping status: Never Used  Substance and Sexual Activity   Alcohol use: No    Alcohol/week: 0.0 standard drinks of alcohol   Drug use: No   Sexual activity: Not Currently    Partners: Male    Birth control/protection: None    Comment: patient's husband has prostate issues so she cannot with him. has not in 74yrs.   Tobacco Counseling Counseling given: Not Answered Tobacco comments: smoking cessation materials not required  SDOH Screenings   Food Insecurity: No Food Insecurity (02/24/2024)  Housing: Low Risk  (02/24/2024)  Transportation Needs: No Transportation Needs (02/24/2024)  Utilities: Not At Risk (10/19/2023)   Received from [facility]  Alcohol Screen: Low Risk  (02/19/2023)  Depression (PHQ2-9): Low Risk  (01/21/2024)  Financial Resource Strain: Low Risk  (02/24/2024)  Physical Activity: Inactive (02/24/2024)  Social Connections: Socially Isolated (02/24/2024)  Stress: Stress Concern Present (02/24/2024)  Tobacco Use: Low Risk  (02/04/2024)   Received from [facility]  Recent Concern: Tobacco Use - Medium Risk (01/21/2024)  Health Literacy: Adequate Health Literacy (02/19/2023)   See flowsheets for full screening details  Depression Screen PHQ 2 & 9  Depression Scale- Over the past 2 weeks, how often have you been bothered by any of the following problems? Little interest or pleasure in doing things: 0 Feeling down, depressed, or hopeless (PHQ Adolescent also includes...irritable): 0 PHQ-2 Total Score: 0 Trouble falling or staying asleep, or sleeping too much: 0 Feeling tired or having little energy: 0 Poor appetite or overeating (PHQ Adolescent also includes...weight loss): 0 Feeling bad about yourself - or that you are a failure or have let yourself or your family down: 0 Trouble concentrating on things, such as  reading the newspaper or watching television (PHQ Adolescent also includes...like school work): 0 Moving or speaking so slowly that other people could have noticed. Or the opposite - being so fidgety or restless that you have been moving around a lot more than usual: 0 Thoughts that you would be better off dead, or of hurting yourself in some way: 0 PHQ-9 Total Score: 0 If you checked off any problems, how difficult have these problems made it for you to do your work, take care of things at home, or get along with other people?: Not difficult at all     Goals Addressed   None    Fall Screening Falls in the past year?: 0 Number of falls in past year: 0 Was there an injury with Fall?: 0 Fall Risk Category Calculator: 0 Patient Fall Risk Level: Low Fall Risk  Fall Risk Patient at Risk for Falls Due to: No Fall Risks Fall risk Follow up: Falls evaluation completed  Advance Directives (For Healthcare) Does Patient Have a Medical Advance Directive?: No Would patient like information on creating a medical advance directive?: No - Patient declined        Objective:    There were no vitals filed for this visit. There is no height or weight on file to calculate BMI.  Current Medications (verified) Outpatient Encounter Medications as of 02/25/2024  Medication Sig   acetaminophen  (TYLENOL ) 325 MG tablet Take 2 tablets (650 mg total) by mouth every 6 (six) hours as needed for mild pain (pain score 1-3) (or Fever >/= 101).   albuterol  (VENTOLIN  HFA) 108 (90 Base) MCG/ACT inhaler Inhale 2 puffs into the lungs every 6 (six) hours as needed for wheezing or shortness of breath.   benzonatate  (TESSALON ) 100 MG capsule Take 1 capsule (100 mg total) by mouth 2 (two) times daily as needed for cough. (Patient not taking: Reported on 01/21/2024)   benzonatate  (TESSALON ) 100 MG capsule Take 1-2 capsules (100-200 mg total) by mouth 3 (three) times daily as needed for cough.   cetirizine  (ZYRTEC ) 10 MG  tablet Take 1 tablet (10 mg total) by mouth daily.   Cholecalciferol (VITAMIN D3) 50 MCG (2000 UT) capsule Take 1 capsule (2,000 Units total) by mouth daily.   cyanocobalamin  (VITAMIN B12) 1000 MCG tablet Take 1 tablet (1,000 mcg total) by mouth daily.   donepezil  (ARICEPT ) 5 MG tablet Take 5 mg by mouth at bedtime.   famotidine  (PEPCID ) 20 MG tablet Take 1 tablet (20 mg total) by mouth 2 (two) times daily.   gabapentin  (NEURONTIN ) 100 MG capsule Take 200 mg by mouth 2 (two) times daily.   PARoxetine  (PAXIL ) 40 MG tablet Take 1 tablet (40 mg total) by mouth every morning.   pyridOXINE (VITAMIN B6) 100 MG tablet Take 100 mg by mouth daily.   simvastatin  (ZOCOR ) 20 MG tablet TAKE 1 TABLET BY MOUTH AT BEDTIME   No facility-administered encounter medications on file as of 02/25/2024.  Hearing/Vision screen No results found. Immunizations and Health Maintenance Health Maintenance  Topic Date Due   COVID-19 Vaccine (1) Never done   Zoster Vaccines- Shingrix (1 of 2) Never done   Influenza Vaccine  Never done   Medicare Annual Wellness (AWV)  02/19/2024   Pneumococcal Vaccine: 50+ Years (1 of 2 - PCV) 10/07/2024 (Originally 12/03/1964)   Colonoscopy  10/07/2024 (Originally 12/25/2022)   Mammogram  06/25/2024   DEXA SCAN  Completed   Hepatitis C Screening  Completed   Meningococcal B Vaccine  Aged Out   DTaP/Tdap/Td  Discontinued        Assessment/Plan:  This is a routine wellness examination for [employer].  Patient Care Team: Sowles, Krichna, MD as PCP - General (Family Medicine) Jaye Fallow, MD as Consulting Physician (Ophthalmology) Hester Alm BROCKS, MD as Consulting Physician (Dermatology) Nord, [place] M, LCSW as Social Worker  I have personally reviewed and noted the following in the patient's chart:   Medical and social history Use of alcohol, tobacco or illicit drugs  Current medications and supplements including opioid prescriptions. Functional ability and  status Nutritional status Physical activity Advanced directives List of other physicians Hospitalizations, surgeries, and ER visits in previous 12 months Vitals Screenings to include cognitive, depression, and falls Referrals and appointments  No orders of the defined types were placed in this encounter.  In addition, I have reviewed and discussed with patient certain preventive protocols, quality metrics, and best practice recommendations. A written personalized care plan for preventive services as well as general preventive health recommendations were provided to patient.   Jhonnie GORMAN Das, LPN   [id number]/[id number]   No follow-ups on file.  After Visit Summary: (MyChart) Due to this being a telephonic visit, the after visit summary with patients personalized plan was offered to patient via MyChart   Nurse Notes: DECLINES ALL SHOTS; ORDERED MAMMOGRAM, UTD ON BDS; AGED OUT OF COLONOSCOPY

## 2024-02-26 ENCOUNTER — Telehealth: Payer: Self-pay

## 2024-02-26 NOTE — Telephone Encounter (Signed)
 Copied from CRM #[id number]. Topic: Clinical - Medical Advice >> Feb 26, 2024  8:55 AM Harlene ORN wrote: Reason for CRM: Patient's daughter called. patient is not sleeping very well. Wants to know if it's okay to give her Melatonin; wants to know if it will interact with her other medications. Please call back the daughter Tobias to discuss: [phone]

## 2024-03-25 ENCOUNTER — Ambulatory Visit: Admitting: Family Medicine

## 2024-03-29 ENCOUNTER — Ambulatory Visit: Admitting: Family Medicine

## 2024-03-29 ENCOUNTER — Encounter: Payer: Self-pay | Admitting: Family Medicine

## 2024-03-29 VITALS — BP 124/72 | HR 82 | Resp 16 | Ht 60.0 in | Wt 194.5 lb

## 2024-03-29 DIAGNOSIS — J410 Simple chronic bronchitis: Secondary | ICD-10-CM

## 2024-03-29 DIAGNOSIS — E7800 Pure hypercholesterolemia, unspecified: Secondary | ICD-10-CM

## 2024-03-29 DIAGNOSIS — N1831 Chronic kidney disease, stage 3a: Secondary | ICD-10-CM

## 2024-03-29 DIAGNOSIS — K219 Gastro-esophageal reflux disease without esophagitis: Secondary | ICD-10-CM

## 2024-03-29 DIAGNOSIS — F01B3 Vascular dementia, moderate, with mood disturbance: Secondary | ICD-10-CM

## 2024-03-29 DIAGNOSIS — F05 Delirium due to known physiological condition: Secondary | ICD-10-CM

## 2024-03-29 DIAGNOSIS — G4720 Circadian rhythm sleep disorder, unspecified type: Secondary | ICD-10-CM

## 2024-03-29 MED ORDER — SIMVASTATIN 20 MG PO TABS: 20.0000 mg | ORAL_TABLET | Freq: Every day | ORAL | 1 refills | Status: AC

## 2024-03-29 MED ORDER — QUETIAPINE FUMARATE 25 MG PO TABS: 25.0000 mg | ORAL_TABLET | Freq: Two times a day (BID) | ORAL | 0 refills | Status: AC

## 2024-03-29 MED ORDER — FAMOTIDINE 20 MG PO TABS: 20.0000 mg | ORAL_TABLET | ORAL | 1 refills | Status: AC

## 2024-03-29 MED ORDER — SPACER/AERO-HOLDING CHAMBERS DEVI: 1.0000 | Freq: Two times a day (BID) | 0 refills | Status: AC

## 2024-03-29 MED ORDER — MOMETASONE FURO-FORMOTEROL FUM 100-5 MCG/ACT IN AERO: 2.0000 | INHALATION_SPRAY | Freq: Two times a day (BID) | RESPIRATORY_TRACT | 0 refills | Status: DC

## 2024-03-29 NOTE — Progress Notes (Signed)
 Name: Abigail Hatfield   MRN: [id number]    DOB: 1946/01/28   Date:03/29/2024       Progress Note  Subjective  Chief Complaint  Chief Complaint  Patient presents with   Medical Management of Chronic Issues   Discussed the use of AI scribe software for clinical note transcription with the patient, who gave verbal consent to proceed.  History of Present Illness Abigail Hatfield is a 78 year old female with moderate vascular dementia who presents for a follow-up visit.  She has been diagnosed with moderate vascular dementia and reports agitation and nervousness, particularly at night. She has sleep disturbances, sometimes going 24 hours without sleep, and exhibits 'sundowning' symptoms, becoming more agitated and unable to sleep at night. Her current medications include Buspar 10 mg three times a day, Aricept  5 mg, paroxetine . Her daughter notes that the midday dose of Buspar has not been given consistently, but it seems to help with anxiety. Patient is no longer allowed to drive due to episodes of getting lost.   She has a history of chronic kidney disease stage 3A. She avoids NSAIDs like Aleve and Motrin and uses Tylenol  for pain management.  She has a history of smoking, having quit 40 years ago, but experiences a chronic cough. She uses albuterol  but finds it difficult to use.  Her weight is stable at 194 lbs. She has prediabetes with an A1c of 5.8 and a BMI over 35. She admits to eating sweets, such as cookies, when unable to sleep.    Patient Active Problem List   Diagnosis Date Noted   Nontraumatic complete tear of left rotator cuff 10/19/2023   Rotator cuff tendinitis, left 10/19/2023   Moderate vascular dementia with mood disturbance (HCC) 08/05/2023   Morbid obesity (HCC) 08/05/2023   Stage 3a chronic kidney disease (HCC) 08/05/2023   Hyperglycemia 09/05/2020   Low serum vitamin B12 09/05/2020   Chronic bilateral low back pain without sciatica 09/05/2020   DDD (degenerative  disc disease), lumbosacral 12/07/2017   Senile purpura 12/07/2017   Hyperlipidemia 12/21/2014    Past Surgical History:  Procedure Laterality Date   ABDOMINAL HYSTERECTOMY  1980   CATARACT EXTRACTION W/PHACO Right 06/24/2017   Procedure: CATARACT EXTRACTION PHACO AND INTRAOCULAR LENS PLACEMENT (IOC);  Surgeon: Jaye Fallow, MD;  Location: [facility] ORS;  Service: Ophthalmology;  Laterality: Right;  US  00:48.0 AP% 16.5 CDE 7.92 Fluid Pack Lot # [id number]   CATARACT EXTRACTION W/PHACO Left 07/14/2017   Procedure: CATARACT EXTRACTION PHACO AND INTRAOCULAR LENS PLACEMENT (IOC);  Surgeon: Jaye Fallow, MD;  Location: [facility] ORS;  Service: Ophthalmology;  Laterality: Left;  US  00:36.0 AP% 16.9 CDE 6.08 Fluid Pack Lot # [id number]   CESAREAN SECTION  1967   CESAREAN SECTION  1971   CHOLECYSTECTOMY  1972   COLONOSCOPY WITH PROPOFOL  N/A 12/24/2017   Procedure: COLONOSCOPY WITH PROPOFOL ;  Surgeon: Therisa Bi, MD;  Location[facility];  Service: Gastroenterology;  Laterality: N/A;   EYE SURGERY     retal fistula repair     Dr. Dellie x 2   TUBAL LIGATION  1980    Family History  Problem Relation Age of Onset   Diabetes Mother    Hypertension Mother    Heart attack Mother    Arthritis Mother    Stroke Mother    Heart failure Father    Heart disease Father    Diabetes Brother    Hypertension Brother    COPD Brother    Diabetes Sister  Asthma Daughter    Depression Daughter    Diabetes Daughter    Miscarriages / Stillbirths Sister    Breast cancer Neg Hx     Social History   Tobacco Use   Smoking status: Former    Current packs/day: 0.00    Average packs/day: 0.5 packs/day for 5.0 years (2.5 ttl pk-yrs)    Types: Cigarettes    Start date: 12/08/1983    Quit date: 12/07/1988    Years since quitting: 35.3   Smokeless tobacco: Never   Tobacco comments:    smoking cessation materials not required  Substance Use Topics   Alcohol use: No    Alcohol/week: 0.0 standard  drinks of alcohol    Current Medications[1]  Allergies[2]  I personally reviewed active problem list, medication list, allergies, family history with the patient/caregiver today.   ROS  Ten systems reviewed and is negative except as mentioned in HPI    Objective Physical Exam  CONSTITUTIONAL: Patient appears well-developed and well-nourished.  No distress. HEENT: Head atraumatic, normocephalic, neck supple. CARDIOVASCULAR: Normal rate, regular rhythm and normal heart sounds.  No murmur heard. No BLE edema. PULMONARY: Effort normal and breath sounds normal. No respiratory distress. ABDOMINAL: There is no tenderness or distention. MUSCULOSKELETAL: Normal gait. Without gross motor or sensory deficit. PSYCHIATRIC: Repeats questions and answers, cooperative today .  Vitals:   03/29/24 1414  BP: 124/72  Pulse: 82  Resp: 16  SpO2: 96%  Weight: 194 lb 8 oz (88.2 kg)  Height: 5' (1.524 m)    Body mass index is 37.99 kg/m.    PHQ2/9:    03/29/2024    2:10 PM 02/25/2024   11:51 AM 01/21/2024    3:19 PM 11/03/2023    1:07 PM 08/05/2023   10:02 AM  Depression screen PHQ 2/9  Decreased Interest 0 0 0 0 1  Down, Depressed, Hopeless 0 0 0 0 1  PHQ - 2 Score 0 0 0 0 2  Altered sleeping 0 0  0 0  Tired, decreased energy 0 0  0 0  Change in appetite 0 0  0 0  Feeling bad or failure about yourself  0 0  0 0  Trouble concentrating 0 0  0 0  Moving slowly or fidgety/restless 0 0  0 0  Suicidal thoughts 0 0  0 0  PHQ-9 Score 0 0  0  2   Difficult doing work/chores Not difficult at all Not difficult at all  Not difficult at all Somewhat difficult     Data saved with a previous flowsheet row definition    phq 9 is negative  Fall Risk:    03/29/2024    2:09 PM 02/25/2024   11:42 AM 01/21/2024    3:19 PM 11/03/2023    1:06 PM 02/19/2023   10:55 AM  Fall Risk   Falls in the past year? 0 0 0 0 0  Number falls in past yr: 0 0 0 0 0  Injury with Fall? 0 0  0  0  0   Risk for  fall due to : No Fall Risks Mental status change No Fall Risks No Fall Risks No Fall Risks  Follow up Falls evaluation completed Falls evaluation completed;Falls prevention discussed Falls evaluation completed Falls evaluation completed Falls prevention discussed;Falls evaluation completed     Data saved with a previous flowsheet row definition      Assessment & Plan Moderate vascular dementia with behavioral and mood disturbance Moderate vascular dementia with  agitation and disrupted sleep. Discussed quetiapine  for dual benefit in calming agitation and aiding sleep. - Prescribed quetiapine  at night for sleep and agitation. - Will schedule appointment with psychiatrist. - Continue buspirone and paroxetine  as prescribed. - Referred to social worker for home health aide evaluation.  Simple chronic bronchitis Chronic cough likely due to chronic bronchitis, improved with famotidine . Recommended Dulera inhaler for inflammation control. - Prescribed Dulera inhaler for chronic bronchitis. - Provided spacer for inhaler use. - Instructed to rinse mouth after inhaler use to prevent thrush.  Stage 3a chronic kidney disease Advised to avoid NSAIDs and maintain hydration. - Advised to avoid NSAIDs such as Aleve and Motrin. - Encouraged increased water intake.  Morbid obesity with prediabetes Morbid obesity with BMI over 35 and prediabetes. Advised on carbohydrate reduction and weight management. - Advised to reduce carbohydrate intake, including sweets and sugary beverages. - Encouraged weight loss efforts.  Pure hypercholesterolemia Hypercholesterolemia managed with simvastatin . Cholesterol levels well-controlled. - Refilled simvastatin  prescription.  Gastroesophageal reflux disease GERD managed with famotidine . - Continue famotidine  as needed for reflux symptoms.        [1]  Current Outpatient Medications:    acetaminophen  (TYLENOL ) 325 MG tablet, Take 2 tablets (650 mg total) by  mouth every 6 (six) hours as needed for mild pain (pain score 1-3) (or Fever >/= 101)., Disp: 20 tablet, Rfl: 0   albuterol  (VENTOLIN  HFA) 108 (90 Base) MCG/ACT inhaler, Inhale 2 puffs into the lungs every 6 (six) hours as needed for wheezing or shortness of breath., Disp: 8 g, Rfl: 0   benzonatate  (TESSALON ) 100 MG capsule, Take 1 capsule (100 mg total) by mouth 2 (two) times daily as needed for cough., Disp: 20 capsule, Rfl: 0   benzonatate  (TESSALON ) 100 MG capsule, Take 1-2 capsules (100-200 mg total) by mouth 3 (three) times daily as needed for cough., Disp: 40 capsule, Rfl: 0   busPIRone (BUSPAR) 10 MG tablet, Take 10 mg by mouth 2 (two) times daily., Disp: , Rfl:    cetirizine  (ZYRTEC ) 10 MG tablet, Take 1 tablet (10 mg total) by mouth daily., Disp: 30 tablet, Rfl: 11   Cholecalciferol (VITAMIN D3) 50 MCG (2000 UT) capsule, Take 1 capsule (2,000 Units total) by mouth daily., Disp: 90 capsule, Rfl: 1   cyanocobalamin  (VITAMIN B12) 1000 MCG tablet, Take 1 tablet (1,000 mcg total) by mouth daily., Disp: 90 tablet, Rfl: 1   famotidine  (PEPCID ) 20 MG tablet, Take 1 tablet (20 mg total) by mouth 2 (two) times daily., Disp: 60 tablet, Rfl: 0   gabapentin  (NEURONTIN ) 100 MG capsule, Take 200 mg by mouth 2 (two) times daily., Disp: , Rfl:    PARoxetine  (PAXIL ) 40 MG tablet, Take 1 tablet (40 mg total) by mouth every morning., Disp: 30 tablet, Rfl: 3   pyridOXINE (VITAMIN B6) 100 MG tablet, Take 100 mg by mouth daily., Disp: , Rfl:    donepezil  (ARICEPT ) 5 MG tablet, Take 5 mg by mouth at bedtime. (Patient not taking: Reported on 03/29/2024), Disp: , Rfl:    simvastatin  (ZOCOR ) 20 MG tablet, TAKE 1 TABLET BY MOUTH AT BEDTIME (Patient not taking: Reported on 03/29/2024), Disp: 90 tablet, Rfl: 0 [2]  Allergies Allergen Reactions   Moxifloxacin Hcl In Nacl Swelling   Prednisone Other (See Comments)    Paranoid   Penicillins Rash and Other (See Comments)    Has patient had a PCN reaction causing  immediate rash, facial/tongue/throat swelling, SOB or lightheadedness with hypotension: Unknown Has patient had a PCN  reaction causing severe rash involving mucus membranes or skin necrosis: No Has patient had a PCN reaction that required hospitalization: Yes Has patient had a PCN reaction occurring within the last 10 years: No If all of the above answers are NO, then may proceed with Cephalosporin use.

## 2024-03-30 ENCOUNTER — Other Ambulatory Visit (HOSPITAL_COMMUNITY): Payer: Self-pay

## 2024-03-30 ENCOUNTER — Telehealth: Payer: Self-pay | Admitting: Pharmacy Technician

## 2024-03-30 ENCOUNTER — Other Ambulatory Visit: Payer: Self-pay | Admitting: Family Medicine

## 2024-03-30 DIAGNOSIS — R062 Wheezing: Secondary | ICD-10-CM

## 2024-03-30 DIAGNOSIS — J208 Acute bronchitis due to other specified organisms: Secondary | ICD-10-CM

## 2024-03-30 DIAGNOSIS — R051 Acute cough: Secondary | ICD-10-CM

## 2024-03-30 NOTE — Telephone Encounter (Signed)
 Pharmacy Patient Advocate Encounter   Received notification from Onbase that prior authorization for Dulera 100-5MCG/ACT aerosol  is required/requested.   Insurance verification completed.   The patient is insured through [facility] ADVANTAGE/RX ADVANCE.   Per test claim: PA required; PA started via CoverMyMeds. KEY [id number] . Waiting for clinical questions to populate.

## 2024-03-30 NOTE — Addendum Note (Signed)
 Addended by: GLENARD MIRE F on: 03/30/2024 04:29 PM   Modules accepted: Orders

## 2024-03-31 ENCOUNTER — Other Ambulatory Visit (HOSPITAL_COMMUNITY): Payer: Self-pay

## 2024-03-31 NOTE — Telephone Encounter (Signed)
 Pharmacy Patient Advocate Encounter  Received notification from [employer] that Prior Authorization for [facility] has been DENIED.  Full denial letter will be uploaded to the media tab. See denial reason below.

## 2024-04-01 ENCOUNTER — Telehealth: Payer: Self-pay

## 2024-04-01 NOTE — Telephone Encounter (Signed)
 Copied from CRM #[id number]. Topic: General - Other >> Apr 01, 2024  4:08 PM Kevelyn M wrote: Reason for CRM: Tiffany with [facility] stated they received a referral for nursing and the patient does not want them to come until after her appointment on December 22nd. She wants them to com on the 23rd. FYI

## 2024-04-01 NOTE — Telephone Encounter (Signed)
 Requested Prescriptions  Pending Prescriptions Disp Refills   albuterol  (VENTOLIN  HFA) 108 (90 Base) MCG/ACT inhaler [Pharmacy Med Name: ALBUTEROL  SULFATE HFA 108 (90 BASE)] 8.5 each 0    Sig: INHALE 2 PUFFS INTO LUNGS EVERY 6 HOURS AS NEEDED FOR WHEEZING OR SHORTNESS OF BREATH     Pulmonology:  Beta Agonists 2 Passed - 04/01/2024  4:16 PM      Passed - Last BP in normal range    BP Readings from Last 1 Encounters:  03/29/24 124/72         Passed - Last Heart Rate in normal range    Pulse Readings from Last 1 Encounters:  03/29/24 82         Passed - Valid encounter within last 12 months    Recent Outpatient Visits           3 days ago Moderate vascular dementia with mood disturbance [facility])   [facility] Glenard Mire, MD   2 months ago Chronic idiopathic constipation   [facility] Bernardo Fend, DO   2 months ago Cough, unspecified type   [facility] Bernardo Fend, DO   3 months ago Cough, unspecified type   [facility] Gareth Mliss FALCON, FNP   4 months ago Acute cough   [facility] Bernardo Fend, [place]

## 2024-04-04 ENCOUNTER — Telehealth: Payer: Self-pay

## 2024-04-04 NOTE — Telephone Encounter (Signed)
 She has already been rescheduled for the 12/23

## 2024-04-04 NOTE — Progress Notes (Signed)
 Complex Care Management Note  Care Guide Note 04/04/2024 Name: Abigail Hatfield MRN: [id number] DOB: 24-May-1945  Abigail Hatfield is a 78 y.o. year old female who sees Sowles, Krichna, MD for primary care. I reached out to Abigail Hatfield by phone today to offer complex care management services.  Abigail Hatfield was given information about Complex Care Management services today including:   The Complex Care Management services include support from the care team which includes your Nurse Care Manager, Clinical Social Worker, or Pharmacist.  The Complex Care Management team is here to help remove barriers to the health concerns and goals most important to you. Complex Care Management services are voluntary, and the patient may decline or stop services at any time by request to their care team member.   Complex Care Management Consent Status: Patient agreed to services and verbal consent obtained.   Follow up plan:  Telephone appointment with complex care management team member scheduled for:  05/03/24 at 2:00 p.m.   Encounter Outcome:  Patient Scheduled  Dreama [facility]  [facility], [facility] VBCI Assistant Direct Dial: [phone]  Fax: [phone]

## 2024-04-04 NOTE — Progress Notes (Signed)
 Complex Care Management Note Care Guide Note  04/04/2024 Name: Abigail Hatfield MRN: [id number] DOB: 1946/04/01   Complex Care Management Outreach Attempts: An unsuccessful telephone outreach was attempted today to offer the patient information about available complex care management services.  Follow Up Plan:  Additional outreach attempts will be made to offer the patient complex care management information and services.   Encounter Outcome:  No Answer  Dreama [facility]  [facility], [facility] VBCI Assistant Direct Dial: [phone]  Fax: [phone]

## 2024-04-12 ENCOUNTER — Telehealth: Payer: Self-pay

## 2024-04-12 ENCOUNTER — Other Ambulatory Visit (HOSPITAL_COMMUNITY): Payer: Self-pay

## 2024-04-12 NOTE — Telephone Encounter (Signed)
 FYI please change RX:     Pharmacy Patient Advocate Encounter   Received notification from [employer] that Prior Authorization for Abigail Hatfield has been DENIED.  Full denial letter will be uploaded to the media tab. See denial reason below.     Copied from CRM [id number]. Topic: Clinical - Prescription Issue >> Apr 12, 2024  8:35 AM Abigail Hatfield wrote: Reason for CRM: Pt's daughter Abigail Hatfield called to report that mometasone -formoterol  (DULERA) 100-5 MCG/ACT AERO requires a prior authorization. Please advise  Best contact: [phone]

## 2024-04-13 ENCOUNTER — Other Ambulatory Visit (HOSPITAL_COMMUNITY): Payer: Self-pay

## 2024-04-13 NOTE — Telephone Encounter (Signed)
 Good morning unfortunately Symbicort is not on her drug formulary

## 2024-04-15 ENCOUNTER — Telehealth: Payer: Self-pay

## 2024-04-15 NOTE — Telephone Encounter (Signed)
 Copied from CRM [id number]. Topic: Clinical - Home Health Verbal Orders >> Apr 13, 2024  3:44 PM Charlet HERO wrote: Caller/Agency: Lacey/ [facility] Callback Number:  [phone] secure line Service Requested: Occupational Therapy  Frequency: Evaluatiion Any new concerns about the patient? No

## 2024-04-15 NOTE — Telephone Encounter (Signed)
 VO given.

## 2024-04-25 ENCOUNTER — Ambulatory Visit: Payer: Self-pay | Admitting: *Deleted

## 2024-04-25 ENCOUNTER — Ambulatory Visit: Payer: Self-pay

## 2024-04-25 NOTE — Telephone Encounter (Signed)
 Copied from CRM #[id number]. Topic: Clinical - Red Word Triage >> Apr 25, 2024  1:22 PM Avram MATSU wrote: Red Word that prompted transfer to Nurse Triage: patient had a fall today, no reports of injury or pain Answer Assessment - Initial Assessment Questions 1. REASON FOR CALL: What is the main reason for your call? or How can I best help you? Debby, PT from home health, called to report patient had unwitnessed fall this morning. Patient does not c/o injuries or pain. Patient appears alert and orientated.   Vital sign WNL BP 133/77 HR 74 17  97.6 93% RA No injuries or pain  Fall not witness, reports patient slipped out of rocking chair onto floor, was able to get up without assistance.  Reports chair will be replaced by daughter, not stable chair.  Hx dementia  Protocols used: Information Only Call - No Triage-A-AH

## 2024-04-25 NOTE — Telephone Encounter (Signed)
 This RN attempted to contact patient, no answer, left voicemail message. Will place in Call Back folder.

## 2024-04-25 NOTE — Telephone Encounter (Signed)
" °  FYI Only or Action Required?: Action required by provider: update on patient condition.  Patient was last seen in primary care on 03/29/2024 by Glenard Mire, MD.  Called Nurse Triage reporting Fall.  Symptoms began today.  Interventions attempted: Nothing.  Symptoms are: stable.  Triage Disposition: Home Care  Patient/caregiver understands and will follow disposition?:  Copied from CRM #[id number]. Topic: Clinical - Red Word Triage >> Apr 25, 2024  1:22 PM Avram MATSU wrote: Red Word that prompted transfer to Nurse Triage: patient had a fall today, no reports of injury or pain >> Apr 25, 2024  2:50 PM Antwanette L wrote:  carla pt daughter is returning  missed call from NT.   Reason for Disposition  [1] Recent fall AND [2] no injury  Answer Assessment - Initial Assessment Questions 1. MECHANISM: How did the fall happen?     Patient went to sit in the recliner- she slide down onto the floor 2. DOMESTIC VIOLENCE AND ELDER ABUSE SCREENING: Did you fall because someone pushed you or tried to hurt you? If Yes, ask: Are you safe now?     no 3. ONSET: When did the fall happen? (e.g., minutes, hours, or days ago)     today 4. LOCATION: What part of the body hit the ground? (e.g., back, buttocks, head, hips, knees, hands, head, stomach)     Knee- not sure what she hit- patient can't recall 5. INJURY: Did you hurt (injure) yourself when you fell? If Yes, ask: What did you injure? Tell me more about this? (e.g., body area; type of injury; pain severity)     No- was practically sitting and didn't fall far- patient was able do her PT 6. PAIN: Is there any pain? If Yes, ask: How bad is the pain? (e.g., Scale 0-10; or none, mild,      No pain 7. SIZE: For cuts, bruises, or swelling, ask: How large is it? (e.g., inches or centimeters)      no  9. OTHER SYMPTOMS: Do you have any other symptoms? (e.g., dizziness, fever, weakness; new-onset or worsening).      no 10.  CAUSE: What do you think caused the fall (or falling)? (e.g., dizzy spell, tripped)       Slipped- denies dizziness or injury  Protocols used: Falls and Falling-A-AH  "

## 2024-05-02 ENCOUNTER — Ambulatory Visit: Payer: Self-pay

## 2024-05-02 ENCOUNTER — Telehealth: Payer: Self-pay | Admitting: Family Medicine

## 2024-05-02 NOTE — Telephone Encounter (Signed)
 Copied from CRM #[id number]. Topic: Clinical - Medication Question >> May 02, 2024  4:21 PM Delon DASEN wrote: Reason for CRM: Dossie  PA was denied, need to know what else may be prescribed- Tobias [phone]

## 2024-05-02 NOTE — Telephone Encounter (Signed)
 This RN made an attempt to reach pt daughter with no answer. A voicemail was left with call back number provided.     Copied from CRM [id number]. Topic: Clinical - Medical Advice >> May 02, 2024  4:22 PM Delon T wrote: Reason for CRM: patient had some burning while urinating and daughter is asking if over the counter test is ok, she is also having trouble getting out of a chair, lift chair is broken- asking if possibility of getting left chair through insurance.

## 2024-05-03 ENCOUNTER — Other Ambulatory Visit: Payer: Self-pay | Admitting: Family Medicine

## 2024-05-03 ENCOUNTER — Other Ambulatory Visit: Payer: Self-pay | Admitting: *Deleted

## 2024-05-03 MED ORDER — FLUTICASONE FUROATE-VILANTEROL 100-25 MCG/ACT IN AEPB: 1.0000 | INHALATION_SPRAY | Freq: Every day | RESPIRATORY_TRACT | 2 refills | Status: AC

## 2024-05-03 NOTE — Telephone Encounter (Signed)
 Per denial letter: Pharmacy Patient Advocate Encounter   Received notification from [facility] ADVANTAGE/RX ADVANCE that Prior Authorization for [facility] has been DENIED.  Full denial letter will be uploaded to the media tab. See denial reason below.

## 2024-05-03 NOTE — Telephone Encounter (Signed)
Carla notified.

## 2024-05-04 NOTE — Patient Outreach (Signed)
 Complex Care Management   Visit Note  05/04/2024  Name:  Abigail Hatfield MRN: [id number] DOB: [birth year]  Situation: Referral received for Complex Care Management related to Dementia I obtained verbal consent from Caregiver Patient.  Visit completed with Caregiver Patient  on the phone  Background:   Past Medical History:  Diagnosis Date   Allergy    Anxiety    Cataract    Chronic kidney disease    Depression    GERD (gastroesophageal reflux disease)    Hyperlipidemia    Medical history non-contributory     Assessment: Patient and daughter contacted by phone. Confirmed that patient resides with her husband. Patient's 2 daughters rotate evenings providing care to patient and her husband as well as 3 private duty care aids. There is a long term plan for patient to move in with her daughter Tillman once their home is re-modeled.  Confirmed that patient attends a support group 2x per month to promote socialization as well as caregiver support. Patient's daughter and aids assist patient in getting out of the home as much as possible. Resources provided for the [place] diaper program through [facility] as well as additional caregiver support group. Patient Reported Symptoms:  Cognitive Cognitive Status: Alert and oriented to person, place, and time, Poor judgment in daily scenarios, Normal speech and language skills, Requires Assistance Decision Making Cognitive/Intellectual Conditions Management [RPT]: Other Other: patient diagnised with vascular dementia   Health Maintenance Behaviors: Annual physical exam, Social activities Healing Pattern: Average Health Facilitated by: Prayer/meditation, Rest  Neurological Neurological Review of Symptoms: No symptoms reported    HEENT HEENT Symptoms Reported: No symptoms reported      Cardiovascular Cardiovascular Symptoms Reported: No symptoms reported    Respiratory Respiratory Symptoms Reported: No symptoms reported    Endocrine  Endocrine Symptoms Reported: No symptoms reported    Gastrointestinal Gastrointestinal Symptoms Reported: No symptoms reported      Genitourinary Genitourinary Symptoms Reported: No symptoms reported    Integumentary Integumentary Symptoms Reported: Itching    Musculoskeletal Musculoskelatal Symptoms Reviewed: Other Other Musculoskeletal Symptoms: reports poor balance, when going upstairs has to have a rail-currenlty receives PT   Falls in the past year?: Yes Number of falls in past year: 2 or more Was there an injury with Fall?: No Fall Risk Category Calculator: 2 Patient Fall Risk Level: Moderate Fall Risk Patient at Risk for Falls Due to: History of fall(s) (working on getting a retail buyer) Fall risk Follow up: Falls prevention discussed  Psychosocial Psychosocial Symptoms Reported: No symptoms reported Additional Psychological Details: attends support group -[employer]-support group for caregivers and indivdulas  diagnosed with dementia  2x per month finds it beneficial-assists with sociaization-patient also has 3 different aids and 2 daughters that rotate patient care patient recently lost her mother and brother, estranged from sister Behavioral Management Strategies: Community resources, Nurse, Adult Health Self-Management Outcome: 4 (good) Major Change/Loss/Stressor/Fears (CP): Death of a loved one, Medical condition, self Behaviors When Feeling Stressed/Fearful: reads, word search, support group Techniques to [employer] with Loss/Stress/Change: Diversional activities, Support group Quality of Family Relationships: helpful, stressful Do you feel physically threatened by others?: No    05/04/2024    PHQ2-9 Depression Screening   Little interest or pleasure in doing things Several days  Feeling down, depressed, or hopeless Not at all  PHQ-2 - Total Score 1  Trouble falling or staying asleep, or sleeping too much    Feeling tired or having little energy    Poor  appetite or overeating     Feeling bad about yourself - or that you are a failure or have let yourself or your family down    Trouble concentrating on things, such as reading the newspaper or watching television    Moving or speaking so slowly that other people could have noticed.  Or the opposite - being so fidgety or restless that you have been moving around a lot more than usual    Thoughts that you would be better off dead, or hurting yourself in some way    PHQ2-9 Total Score    If you checked off any problems, how difficult have these problems made it for you to do your work, take care of things at home, or get along with other people    Depression Interventions/Treatment      There were no vitals filed for this visit. Pain Scale: 0-10 Pain Score: 2  Pain Type: Chronic pain (with movement) Pain Location: Shoulder Pain Orientation: Right Pain Descriptors / Indicators: Aching Pain Onset: Sudden Patients Stated Pain Goal: 0  Medications Reviewed Today     Reviewed by Ermalinda Lenn HERO, LCSW (Social Worker) on 05/03/24 at 1417  Med List Status: <None>   Medication Order Taking? Sig Documenting Provider Last Dose Status Informant  acetaminophen  (TYLENOL ) 325 MG tablet [id number] Yes Take 2 tablets (650 mg total) by mouth every 6 (six) hours as needed for mild pain (pain score 1-3) (or Fever >/= 101). Dorinda Drue DASEN, MD  Active   albuterol  (VENTOLIN  HFA) 108 [phone] Base) MCG/ACT inhaler [id number] Yes INHALE 2 PUFFS INTO LUNGS EVERY 6 HOURS AS NEEDED FOR WHEEZING OR SHORTNESS OF SHERIDA Loron, Krichna, MD  Active   busPIRone (BUSPAR) 10 MG tablet [id number] Yes Take 10 mg by mouth 2 (two) times daily. [provider]  Active   cetirizine  (ZYRTEC ) 10 MG tablet [id number] Yes Take 1 tablet (10 mg total) by mouth daily. Pender, Julie F, FNP  Active   Cholecalciferol (VITAMIN D3) 50 MCG (2000 UT) capsule [id number]  Take 1 capsule (2,000 Units total) by mouth daily. Sowles, Krichna, MD   Active Pharmacy Records, Child  cyanocobalamin  (VITAMIN B12) 1000 MCG tablet [id number] Yes Take 1 tablet (1,000 mcg total) by mouth daily. Sowles, Krichna, MD  Active Pharmacy Records, Child  donepezil  (ARICEPT ) 5 MG tablet [id number]  Take 5 mg by mouth at bedtime.  Patient not taking: Reported on 05/03/2024   [provider]  Active Pharmacy Records, Child  famotidine  (PEPCID ) 20 MG tablet [id number] Yes Take 1 tablet (20 mg total) by mouth every morning. Sowles, Krichna, MD  Active   fluticasone  furoate-vilanterol (BREO ELLIPTA ) 100-25 MCG/ACT AEPB [id number] Yes Inhale 1 puff into the lungs daily. Sowles, Krichna, MD  Active   gabapentin  (NEURONTIN ) 100 MG capsule [id number] Yes Take 200 mg by mouth 2 (two) times daily. [provider]  Active Pharmacy Records, Child           Med Note NORVEL KIRSCH   Wed Jul 09, 2022  2:28 PM) Prescribed by Dr. Lane  PARoxetine  (PAXIL ) 40 MG tablet [id number] Yes Take 1 tablet (40 mg total) by mouth every morning. Bernardo Fend, DO  Active   pyridOXINE (VITAMIN B6) 100 MG tablet [id number] Yes Take 100 mg by mouth daily. [provider]  Active Pharmacy Records, Child  QUEtiapine  (SEROQUEL ) 25 MG tablet [id number] Yes Take 1 tablet (25 mg total) by mouth 2 (two) times daily. With supper and bedtime Sowles, Krichna, MD  Active   simvastatin  (ZOCOR ) 20 MG tablet [id number] Yes Take 1 tablet (20 mg total) by mouth at bedtime. Sowles, Krichna, MD  Active   Spacer/Aero-Holding Chambers DEVI [id number] Yes 1 each by Does not apply route 2 (two) times daily. Sowles, Krichna, MD  Active             Recommendation:   PCP Follow-up Resources provided for [facility] and caregiver support Follow Up Plan:   Closing From:  Complex Care Management  Jastin Fore, LCSW [facility]  [facility], [facility] Licensed Clinical Social Worker  Direct Dial: [phone]

## 2024-05-05 ENCOUNTER — Encounter: Payer: Self-pay | Admitting: Family Medicine

## 2024-05-05 ENCOUNTER — Other Ambulatory Visit (HOSPITAL_COMMUNITY)
Admission: RE | Admit: 2024-05-05 | Discharge: 2024-05-05 | Disposition: A | Source: Ambulatory Visit | Attending: Family Medicine | Admitting: Family Medicine

## 2024-05-05 ENCOUNTER — Ambulatory Visit (INDEPENDENT_AMBULATORY_CARE_PROVIDER_SITE_OTHER): Admitting: Family Medicine

## 2024-05-05 VITALS — BP 128/72 | HR 82 | Resp 16 | Ht 60.0 in | Wt 197.0 lb

## 2024-05-05 DIAGNOSIS — F419 Anxiety disorder, unspecified: Secondary | ICD-10-CM

## 2024-05-05 DIAGNOSIS — N760 Acute vaginitis: Secondary | ICD-10-CM

## 2024-05-05 DIAGNOSIS — R300 Dysuria: Secondary | ICD-10-CM

## 2024-05-05 LAB — POCT URINALYSIS DIPSTICK
Appearance: NORMAL
Bilirubin, UA: NEGATIVE
Glucose, UA: NEGATIVE
Ketones, UA: NEGATIVE
Leukocytes, UA: NEGATIVE
Nitrite, UA: NEGATIVE
Protein, UA: NEGATIVE
Spec Grav, UA: 1.01
Urobilinogen, UA: 0.2 U/dL
pH, UA: 6

## 2024-05-05 MED ORDER — NITROFURANTOIN MONOHYD MACRO 100 MG PO CAPS: 100.0000 mg | ORAL_CAPSULE | Freq: Two times a day (BID) | ORAL | 0 refills | Status: AC

## 2024-05-05 NOTE — Progress Notes (Signed)
 "  Acute Office Visit  Subjective:     Patient ID: Abigail Hatfield, female    DOB: 1946-02-19, 79 y.o.   MRN: [id number]  Chief Complaint  Patient presents with   Dysuria    On/off x1 week    HPI Patient is in today for concerns of dysuria. She is a new patient to me. She is accompanied to today's visit by her daughter. Her daughter reports that her sister was with the patient this past week and the patient had told her sister that she had episodes of dysuria. Her sister had also voiced to the daughter present today that patient was voiding more than usual.   Patient confirms increased urinary frequency with intermittent dysuria over the last one week. She describes dysuria has an itching and states that dysuria often follows voiding not necessarily during voiding. She does wear adult briefs, she calls them diapers, due to urinary incontinence. Denies vaginal discharge or vaginal pain. With vaginitis symptoms (itching) and use of briefs, will proceed with vaginal swab to rule out yeast or BV. She is agreeable.  Urine dipstick positive for hematuria, negative for nitrites and leukocytes. Will treat suspected UTI with Macrobid . Urine culture pending for confirmation.    Her daughter voices that the patient has mentioned that she does not feel like the Buspar is enough but they do have an upcoming psychiatry appointment. Advised to follow through with psychiatry referral for further management of anxiety.   Review of Systems  Genitourinary:  Positive for dysuria and frequency.  Skin:  Positive for itching.  Psychiatric/Behavioral:  The patient is nervous/anxious.         Objective:    BP 128/72   Pulse 82   Resp 16   Ht 5' (1.524 m)   Wt 197 lb (89.4 kg)   SpO2 99%   BMI 38.47 kg/m      Physical Exam Constitutional:      Appearance: Normal appearance. She is not toxic-appearing.  Cardiovascular:     Rate and Rhythm: Normal rate and regular rhythm.     Heart sounds:  Normal heart sounds.  Pulmonary:     Effort: Pulmonary effort is normal.     Breath sounds: Normal breath sounds.  Neurological:     General: No focal deficit present.     Mental Status: She is alert.  Psychiatric:        Mood and Affect: Mood normal.        Behavior: Behavior normal.       Assessment & Plan:   Assessment & Plan Dysuria Dysuria described as intermittent for the last one week with associated increased urinary frequency. See HPI for details.  Urine dipstick in office positive for hematuria. Urine dip negative for nitrites and leukocytes.  Will treat as suspected UTI.   -Macrobid  100mg  BID x 5 days -Urine culture obtained, pending  Orders:   POCT Urinalysis Dipstick   Urine Culture   nitrofurantoin , macrocrystal-monohydrate, (MACROBID ) 100 MG capsule; Take 1 capsule (100 mg total) by mouth 2 (two) times daily.  Acute vaginitis Acute vaginitis symptoms includes itching. Denies abnormal vaginal discharge or other vaginal irritation. Reviewed thoughts with the patient that she is at an increased risk for yeast infection due to urinary incontinence and use of briefs creating warm, moist environment. She is agreeable to complete vaginal swab to rule out yeast infection or BV.   -Vaginal swab obtained, pending.  Orders:   Cervicovaginal ancillary only  Anxiety Uncontrolled anxiety  with current medication regimen, Paroxetine  and Buspar, however she has upcoming psychiatry appointment.  No changes to be made to medication regimen at today's visit. Defer to psychiatry.   -Advised to follow up with psychiatry for management of anxiety        Return for follow up as scheduled with PCP.  LAYMON LOISE CORE, FNP  "

## 2024-05-06 LAB — CERVICOVAGINAL ANCILLARY ONLY
Bacterial Vaginitis (gardnerella): NEGATIVE
Candida Glabrata: NEGATIVE
Candida Vaginitis: NEGATIVE
Comment: NEGATIVE
Comment: NEGATIVE
Comment: NEGATIVE

## 2024-05-07 LAB — URINE CULTURE
MICRO NUMBER:: 17506578
SPECIMEN QUALITY:: ADEQUATE

## 2024-05-08 ENCOUNTER — Ambulatory Visit: Payer: Self-pay | Admitting: Family Medicine

## 2024-05-10 ENCOUNTER — Ambulatory Visit: Payer: Self-pay

## 2024-05-10 NOTE — Telephone Encounter (Signed)
 Message from [place] S sent at 05/10/2024  3:44 PM EST  Reason for Triage: pt had a fall off the toilet

## 2024-05-10 NOTE — Telephone Encounter (Signed)
 This RN attempted to contact patient, no answer, left voicemail message. Will place in Call Back folder.

## 2024-05-10 NOTE — Telephone Encounter (Signed)
 FYI Only or Action Required?: FYI only for provider: Will continue to monitor, lives with a caregiver. ED precautions reviewed, pts daughter verbalized understanding.  Patient was last seen in primary care on 05/05/2024 by Edith, Laymon SAILOR, FNP.  Called Nurse Triage reporting Fall.  Symptoms began several days ago.  Interventions attempted: Ice/heat application.  Symptoms are: completely resolved.  Triage Disposition: Home Care  Patient/caregiver understands and will follow disposition?: Yes  Reason for Disposition  Small cut (scratch) or abrasion (scrape) is also present  Answer Assessment - Initial Assessment Questions EMS assessed pt post fall on 05/07/24. Denies LOC, daughter was home and heard her fall. Reports mild general body aches.   Pts daughter is concerned that her weight is beginning to affect her mobility, will discuss with patient and call back to schedule an OV to discuss with PCP if pt is agreeable.  ED/911 advised it pt develops headache, dizziness, difficulty ambulating or any other new symptoms.  1. MECHANISM: How did the fall happen?     Fall from toilet into the wall. Pt sustained a skin tear to her nose and behind left ear 2. DOMESTIC VIOLENCE AND ELDER ABUSE SCREENING: Did you fall because someone pushed you or tried to hurt you? If Yes, ask: Are you safe now?     Denies, pt was alone 3. ONSET: When did the fall happen? (e.g., minutes, hours, or days ago)     05/07/24 4. LOCATION: What part of the body hit the ground? (e.g., back, buttocks, head, hips, knees, hands, head, stomach)     See above 5. INJURY: Did you hurt (injure) yourself when you fell? If Yes, ask: What did you injure? Tell me more about this? (e.g., body area; type of injury; pain severity)     See above 6. PAIN: Is there any pain? If Yes, ask: How bad is the pain? (e.g., Scale 0-10; or none, mild,      See above 9. OTHER SYMPTOMS: Do you have any other symptoms? (e.g.,  dizziness, fever, weakness; new-onset or worsening).      Denies 10. CAUSE: What do you think caused the fall (or falling)? (e.g., dizzy spell, tripped)       Slid off of toilet, states it is because she is short.  Protocols used: Falls and [facility]

## 2024-05-10 NOTE — Telephone Encounter (Signed)
 Received call from Tommy PTA, with [facility]. PTA reports patient is not currently with him, just finished therapy session.  PTA reports patient unwitnessed fall on Saturday, called EMS and pt's daughter. Reports hit nose, bruising noted, no other symptoms.  Bilateral shoulder pain prior to fall.   Nurse will attempt to contact patient.

## 2024-05-31 ENCOUNTER — Ambulatory Visit: Admitting: Family Medicine

## 2024-08-30 ENCOUNTER — Ambulatory Visit: Admitting: Family Medicine

## 2025-03-02 ENCOUNTER — Ambulatory Visit
